# Patient Record
Sex: Male | Born: 1955
Health system: Southern US, Community
[De-identification: ages and names within clinical notes are randomized; demographics above are authoritative.]

## PROBLEM LIST (undated history)

## (undated) DIAGNOSIS — R51 Headache: Secondary | ICD-10-CM

## (undated) DIAGNOSIS — I5032 Chronic diastolic (congestive) heart failure: Secondary | ICD-10-CM

## (undated) DIAGNOSIS — Z9989 Dependence on other enabling machines and devices: Secondary | ICD-10-CM

## (undated) DIAGNOSIS — I441 Atrioventricular block, second degree: Secondary | ICD-10-CM

## (undated) DIAGNOSIS — I442 Atrioventricular block, complete: Secondary | ICD-10-CM

## (undated) DIAGNOSIS — G4733 Obstructive sleep apnea (adult) (pediatric): Secondary | ICD-10-CM

## (undated) DIAGNOSIS — R972 Elevated prostate specific antigen [PSA]: Secondary | ICD-10-CM

## (undated) DIAGNOSIS — N400 Enlarged prostate without lower urinary tract symptoms: Secondary | ICD-10-CM

## (undated) DIAGNOSIS — R600 Localized edema: Secondary | ICD-10-CM

## (undated) DIAGNOSIS — E1165 Type 2 diabetes mellitus with hyperglycemia: Secondary | ICD-10-CM

## (undated) DIAGNOSIS — I4510 Unspecified right bundle-branch block: Secondary | ICD-10-CM

## (undated) DIAGNOSIS — R519 Headache, unspecified: Secondary | ICD-10-CM

## (undated) DIAGNOSIS — J189 Pneumonia, unspecified organism: Secondary | ICD-10-CM

## (undated) DIAGNOSIS — Z950 Presence of cardiac pacemaker: Secondary | ICD-10-CM

## (undated) DIAGNOSIS — I10 Essential (primary) hypertension: Secondary | ICD-10-CM

## (undated) HISTORY — PX: PROSTATE BIOPSY: SHX241

## (undated) HISTORY — DX: Obstructive sleep apnea (adult) (pediatric): G47.33

## (undated) HISTORY — DX: Essential (primary) hypertension: I10

## (undated) HISTORY — PX: COLONOSCOPY: SHX174

## (undated) HISTORY — DX: Obstructive sleep apnea (adult) (pediatric): Z99.89

---

## 2003-09-14 ENCOUNTER — Ambulatory Visit (HOSPITAL_COMMUNITY): Admission: RE | Admit: 2003-09-14 | Discharge: 2003-09-14 | Payer: Self-pay | Admitting: Gastroenterology

## 2014-01-04 ENCOUNTER — Ambulatory Visit: Payer: Self-pay | Admitting: Cardiology

## 2014-12-13 ENCOUNTER — Encounter: Payer: Self-pay | Admitting: *Deleted

## 2016-12-06 DIAGNOSIS — I10 Essential (primary) hypertension: Secondary | ICD-10-CM | POA: Diagnosis not present

## 2016-12-06 DIAGNOSIS — G4733 Obstructive sleep apnea (adult) (pediatric): Secondary | ICD-10-CM | POA: Diagnosis not present

## 2016-12-06 DIAGNOSIS — Z0000 Encounter for general adult medical examination without abnormal findings: Secondary | ICD-10-CM | POA: Diagnosis not present

## 2016-12-11 ENCOUNTER — Encounter: Payer: Self-pay | Admitting: Cardiology

## 2016-12-11 DIAGNOSIS — R001 Bradycardia, unspecified: Secondary | ICD-10-CM | POA: Diagnosis not present

## 2016-12-11 DIAGNOSIS — I10 Essential (primary) hypertension: Secondary | ICD-10-CM | POA: Diagnosis not present

## 2016-12-11 DIAGNOSIS — I4510 Unspecified right bundle-branch block: Secondary | ICD-10-CM | POA: Diagnosis not present

## 2016-12-12 ENCOUNTER — Ambulatory Visit (INDEPENDENT_AMBULATORY_CARE_PROVIDER_SITE_OTHER): Payer: Commercial Managed Care - HMO | Admitting: Internal Medicine

## 2016-12-12 ENCOUNTER — Encounter: Payer: Self-pay | Admitting: Cardiology

## 2016-12-12 ENCOUNTER — Encounter: Payer: Self-pay | Admitting: Internal Medicine

## 2016-12-12 ENCOUNTER — Encounter (INDEPENDENT_AMBULATORY_CARE_PROVIDER_SITE_OTHER): Payer: Self-pay

## 2016-12-12 VITALS — BP 142/94 | HR 105 | Ht 75.0 in | Wt 331.8 lb

## 2016-12-12 DIAGNOSIS — I441 Atrioventricular block, second degree: Secondary | ICD-10-CM

## 2016-12-12 DIAGNOSIS — Z01812 Encounter for preprocedural laboratory examination: Secondary | ICD-10-CM | POA: Diagnosis not present

## 2016-12-12 DIAGNOSIS — I4430 Unspecified atrioventricular block: Secondary | ICD-10-CM | POA: Diagnosis not present

## 2016-12-12 DIAGNOSIS — I498 Other specified cardiac arrhythmias: Secondary | ICD-10-CM | POA: Diagnosis not present

## 2016-12-12 NOTE — Progress Notes (Signed)
      HPI [name] is referred by Dr. Wynonia Lawman for evaluation of symptomatic 2:1 AV block in the setting of exercise. He is a pleasant morbidly obese man with HTN, DM, obesity who has had problems with worsening sob. He has baseline RBBB but no syncope. He does have spells of weakness.  No Known Allergies   Current Outpatient Prescriptions  Medication Sig Dispense Refill  . amLODipine (NORVASC) 5 MG tablet Take 1 tablet by mouth daily.    Marland Kitchen losartan-hydrochlorothiazide (HYZAAR) 50-12.5 MG tablet Take 2 tablets by mouth daily.     No current facility-administered medications for this visit.      Past Medical History:  Diagnosis Date  . History of migraine headaches   . HTN (hypertension)   . OSA on CPAP     ROS:   All systems reviewed and negative except as noted in the HPI.   History reviewed. No pertinent surgical history.   Family History  Problem Relation Age of Onset  . Lung cancer Mother   . Arthritis Mother   . Colon cancer Father   . Lung cancer Father   . Healthy Brother     x3  . Healthy Sister      Social History   Social History  . Marital status: Divorced    Spouse name: N/A  . Number of children: 2  . Years of education: N/A   Occupational History  . Not on file.   Social History Main Topics  . Smoking status: Never Smoker  . Smokeless tobacco: Former Systems developer    Types: Snuff    Quit date: 06/11/2016  . Alcohol use 0.0 oz/week     Comment: occasionally   . Drug use: No  . Sexual activity: No   Other Topics Concern  . Not on file   Social History Narrative  . No narrative on file     BP (!) 142/94   Pulse (!) 105   Ht 6\' 3"  (1.905 m)   Wt (!) 331 lb 12.8 oz (150.5 kg)   SpO2 96%   BMI 41.47 kg/m   Physical Exam:  Stable appearing obese, middle age man, NAD HEENT: Unremarkable Neck:  6 cm JVD, no thyromegally Lymphatics:  No adenopathy Back:  No CVA tenderness Lungs:  Clear with no wheezes HEART:  Regular rate rhythm, no  murmurs, no rubs, no clicks Abd:  soft, obese, positive bowel sounds, no organomegally, no rebound, no guarding Ext:  2 plus pulses, no edema, no cyanosis, no clubbing Skin:  No rashes no nodules Neuro:  CN II through XII intact, motor grossly intact  EKG - reviewed. NSR with RBBB  Treadmill - NSR with RBBB then sinus tachycardia developing into 2:1 AV block  Assess/Plan: 1. Exercise induced 2:1 AV block - the patient has symptomatic advanced conduction system disease. I have recommended he undergo PPM insertion. The risks/benefits/goals/expectations of the procedure were reviewed and he wishes to proceed. I would anticipate placing a His bundle lead. 2. HTN heart disease - after his device is placed, I would anticipate he be started on AV nodal blocking agents. 3. Obesity - he will need to work on this. 4. Sleep apnea - records state he is on CPAP.  Weight loss is encouraged.

## 2016-12-12 NOTE — Consult Note (Signed)
Juan Hanson  Date of visit:  12/11/2016 DOB:  1956/07/12    Age:  61 yrs. Medical record number:  [id number]     Account number:  [id number] Primary Care Provider: Moreen Fowler, DAVID ____________________________ CURRENT DIAGNOSES  1.  Bradycardia, unspecified   2. Right Bundle Branch Block  3. Essential hypertension  4. Sleep apnea  5. Morbid (severe) obesity ____________________________ ALLERGIES  No Known Allergies ____________________________ MEDICATIONS  1. losartan 50 mg-hydrochlorothiazide 12.5 mg tablet, 1 p.o. daily  2. amlodipine 5 mg tablet, 1 p.o. daily ____________________________ HISTORY OF PRESENT ILLNESS This 61 year old male is seen for evaluation of bradycardia and an abnormal EKG. The patient has gained about 30 pounds of weight recently and has severe obesity. He works the night shift and does not get any exercise. He states that he will be retiring from work soon. He was recently seen for a physical and was noted to have bradycardia as well as a right bundle-branch block on his EKG. He was noted to be significantly hypertensive and was started on blood pressure medicine. He has been checking his blood pressure at home and still notes significant elevations of systolic and diastolic blood pressure. He denies PND, orthopnea or edema. He does not have any significant angina. He has significant sleep apnea and has been wearing CPAP for that. He is committed to losing weight at the present time. He has no prior history of an abnormal EKG. He does not have dizziness or syncope. ____________________________ PAST HISTORY  Past Medical Illnesses:  hypertension, sleep apnea, obesity;  Cardiovascular Illnesses:  no previous history of cardiac disease;  Infectious Diseases:  no previous history of significant infectious diseases;  Surgical Procedures:  no previous surgical procedures;  Trauma History:  no previous history of significant trauma;  NYHA Classification:  I;  Canadian Angina  Classification:  Class 0: Asymptomatic;  Cardiology Procedures-Invasive:  no previous interventional or invasive cardiology procedures;  Cardiology Procedures-Noninvasive:  no previous non-invasive cardiovascular testing;  Peripheral Vascular Procedures:  no previous invasive peripheral vascular procedures.;  LVEF not documented,   ____________________________ CARDIO-PULMONARY TEST DATES EKG Date:  12/11/2016;   ____________________________ FAMILY HISTORY Brother -- Brother alive and well Brother -- Brother alive and well Brother -- Brother alive and well Father -- Father dead, Cancer Mother -- Mother dead, Arthritis, Malignant neoplasm of lung Sister -- Sister alive and well ____________________________ SOCIAL HISTORY Alcohol Use:  socially;  Smoking:  never smoked, uses smokeless tobacco (snuff) occasionally;  Diet:  regular diet;  Lifestyle:  divorced, 1 son and 1 daughter;  Exercise:  no regular exercise;  Occupation:  [employer];  Residence:  lives alone;   ____________________________ REVIEW OF SYSTEMS General:  obesity, weight gain of approximately 30 lbs  Integumentary:no rashes or new skin lesions. Eyes: denies diplopia, history of glaucoma or visual problems. Ears, Nose, Throat, Mouth:  denies any hearing loss, epistaxis, hoarseness or difficulty speaking. Respiratory: denies dyspnea, cough, wheezing or hemoptysis. Cardiovascular:  please review HPI Abdominal: denies dyspepsia, GI bleeding, constipation, or diarrhea Genitourinary-Male: no dysuria, urgency, frequency, or nocturia  Musculoskeletal:  denies arthritis, venous insufficiency, or muscle weakness Neurological:  denies headaches, stroke, or TIA Psychiatric:  denies depression or anxiety Hematological/Immunologic:  denies any food allergies, bleeding disorders. ____________________________ PHYSICAL EXAMINATION VITAL SIGNS  Blood Pressure:  166/104 Sitting, Left arm, large cuff  , 170/100 Standing, Left arm and  large cuff   Pulse:  84/min. Weight:  331.00 lbs. Height:  75"BMI: 41  Constitutional:  pleasant white male in no acute distress, severely obese Skin:  warm and dry to touch, no apparent skin lesions, or masses noted. Head:  normocephalic, normal hair pattern, no masses or tenderness Eyes:  EOMS Intact, PERRLA, C and S clear, Funduscopic exam not done. ENT:  ears, nose and throat reveal no gross abnormalities.  Dentition good. Neck:  supple, without massess. No JVD, thyromegaly or carotid bruits. Carotid upstroke normal. Chest:  normal symmetry, clear to auscultation. Cardiac:  regular rhythm, normal S1 and S2, No S3 or S4, no murmurs, gallops or rubs detected. Abdomen:  abdomen soft,non-tender, no masses, no hepatospenomegaly, or aneurysm noted Peripheral Pulses:  the femoral,dorsalis pedis, and posterior tibial pulses are full and equal bilaterally with no bruits auscultated. Extremities & Back:  no deformities, clubbing, cyanosis, erythema or edema observed. Normal muscle strength and tone. Neurological:  no gross motor or sensory deficits noted, affect appropriate, oriented x3. ____________________________ IMPRESSIONS/PLAN  1. Previous sinus bradycardia but normal rhythm on EKG today 2. Right bundle-branch block 3. Hypertension currently not well controlled 4. Severe morbid obesity 5. Sleep apnea under treatment  Recommendations:  We discussed the right bundle-branch block as well as sinus bradycardia. He is really not symptomatic from either at this time. He does have blood pressure that is currently not well controlled. I recommended that he add amlodipine 5 mg to his regimen and that he double his losartan 50/HCTZ 12.5 mg tablet. He should continue to monitor his blood pressure and pulse at home. I recommended an exercise treadmill test to evaluate his chronotropic response to exercise and have also recommended that he have an echocardiogram to assess left ventricular wall thickness  in light of his hypertension. He is committed to following a carbohydrate restricted diet and losing weight. We will know about the safety of exercise after his exercise treadmill. EKG personally reviewed by me shows RBBB and nonspcific ST changes.  Thank you for asking me to see him with you. Have requested a copy of the EKG done at Dr. Laqueta Linden office.  ____________________________ TODAYS ORDERS  1. 12 Lead EKG: Today  2. 2D, color flow, doppler: First Available  3. treadmill:  Regular TM First Available                       ____________________________ Cardiology Physician:  Kerry Hough MD [facility]

## 2016-12-12 NOTE — Patient Instructions (Addendum)
Medication Instructions:  Your physician recommends that you continue on your current medications as directed. Please refer to the Current Medication list given to you today.   Labwork: TODAY - BMET / CBC w/ diff  Testing/Procedures: Dual Chamber His Bundle Pacemaker  Follow-Up: Follow-up for a wound check in the Device Clinic 10-14 days from 12/18/16 and with Dr. Lovena Le 91 days after 12/18/16.    Any Other Special Instructions Will Be Listed Below ---  Please wash with the CHG Soap the night before and morning of procedure (follow instruction page "Preparing For Surgery").   Report to the  [facility] on 12/18/16 at 9:30 AM  Nothing to eat or drink after midnight the night before procedure  Do not take any medications the morning of procedure  Plan 1 night stay    If you need a refill on your cardiac medications before your next appointment, please call your pharmacy.

## 2016-12-12 NOTE — Procedures (Signed)
  Juan Hanson  Date of visit:  12/12/2016 DOB:  1956-08-16    Age:  61 yrs. Medical record number:  [id number]     Account number:  [id number] Primary Care Provider: Moreen Fowler, DAVID  CURRENT DIAGNOSES  1. Right Bundle Branch Block  2. Bradycardia, unspecified  3. Essential hypertension  4. Sleep apnea  5. Morbid (severe) obesity  TREADMILL  The patient exercised on the standard Bruce protocol a total of 6:09 minutes into Bruce stage 2 achieving a workload of 7 METS. The test was stopped due to dyspnea and fatigue. The patient had no chest pain suggestive of angina. His heart rate initially rose to 130 and he then began to have some intermittent dropped beats at 3:48 minutes into exercise and then went into 2 to one heart block at a rate of 70. He was able to continue exercise however and at 4:44 minutes into exercise lost his right bundle branch block and appeared to have normal conduction with occasional conducted beats. He remained in 21 heart block and had a normal blood pressure response to exercise. At 1:49 and exercise he then went back into 1:1 conduction.  12-lead EKG shows a right bundle branch block at rest. With exercise the patient developed a 2-1 heart block and also normalized conduction. When he was in normal conduction there was no significant ST depression. In recovery he develops one-to-one conduction and developed right bundle branch block again.  IMPRESSIONS:  1. Clinically negative for ischemia 2. EKG remarkable for the development of 2-1 heart block and more advanced conduction system disease with exercise that persists into recovery and resolves. 3. Reduced exercise capacity for age  Recommendations:  Referral to electrophysiology for further evaluation. Await results of echocardiogram.   Cardiology Physician:  Kerry Hough MD [facility]

## 2016-12-13 ENCOUNTER — Encounter: Payer: Self-pay | Admitting: Cardiology

## 2016-12-13 DIAGNOSIS — I119 Hypertensive heart disease without heart failure: Secondary | ICD-10-CM | POA: Insufficient documentation

## 2016-12-13 DIAGNOSIS — G4730 Sleep apnea, unspecified: Secondary | ICD-10-CM | POA: Insufficient documentation

## 2016-12-13 LAB — CBC WITH DIFFERENTIAL/PLATELET
BASOS ABS: 0 10*3/uL (ref 0.0–0.2)
BASOS: 0 %
EOS (ABSOLUTE): 0.1 10*3/uL (ref 0.0–0.4)
EOS: 1 %
HEMATOCRIT: 45 % (ref 37.5–51.0)
HEMOGLOBIN: 15.9 g/dL (ref 13.0–17.7)
IMMATURE GRANS (ABS): 0 10*3/uL (ref 0.0–0.1)
Immature Granulocytes: 0 %
Lymphocytes Absolute: 2.7 10*3/uL (ref 0.7–3.1)
Lymphs: 27 %
MCH: 28.8 pg (ref 26.6–33.0)
MCHC: 35.3 g/dL (ref 31.5–35.7)
MCV: 81 fL (ref 79–97)
MONOCYTES: 9 %
Monocytes Absolute: 0.9 10*3/uL (ref 0.1–0.9)
NEUTROS ABS: 6.1 10*3/uL (ref 1.4–7.0)
Neutrophils: 63 %
Platelets: 224 10*3/uL (ref 150–379)
RBC: 5.53 x10E6/uL (ref 4.14–5.80)
RDW: 14.1 % (ref 12.3–15.4)
WBC: 9.8 10*3/uL (ref 3.4–10.8)

## 2016-12-13 LAB — BASIC METABOLIC PANEL
BUN/Creatinine Ratio: 19 (ref 9–20)
BUN: 18 mg/dL (ref 6–24)
CALCIUM: 9.5 mg/dL (ref 8.7–10.2)
CHLORIDE: 97 mmol/L (ref 96–106)
CO2: 27 mmol/L (ref 18–29)
CREATININE: 0.94 mg/dL (ref 0.76–1.27)
GFR calc Af Amer: 102 mL/min/{1.73_m2} (ref >59)
GFR, EST NON AFRICAN AMERICAN: 88 mL/min/{1.73_m2} (ref >59)
Glucose: 91 mg/dL (ref 65–99)
Potassium: 4.4 mmol/L (ref 3.5–5.2)
Sodium: 142 mmol/L (ref 134–144)

## 2016-12-14 ENCOUNTER — Telehealth: Payer: Self-pay | Admitting: Internal Medicine

## 2016-12-14 DIAGNOSIS — I119 Hypertensive heart disease without heart failure: Secondary | ICD-10-CM | POA: Diagnosis not present

## 2016-12-14 NOTE — Telephone Encounter (Signed)
°  New Prob   Pt has some questions regarding his upcoming cath procedure. Please call.

## 2016-12-14 NOTE — Telephone Encounter (Signed)
Called, spoke with pt. Reviewed pt instructions r/t procedure on 12/18/16. Pt verbalized understanding.

## 2016-12-18 ENCOUNTER — Encounter (HOSPITAL_COMMUNITY): Payer: Self-pay | Admitting: General Practice

## 2016-12-18 ENCOUNTER — Encounter (HOSPITAL_COMMUNITY)
Admission: RE | Disposition: A | Discharge status: Home / Self Care | Payer: Self-pay | Source: Ambulatory Visit | Attending: Internal Medicine

## 2016-12-18 ENCOUNTER — Ambulatory Visit (HOSPITAL_COMMUNITY)
Admission: RE | Admit: 2016-12-18 | Discharge: 2016-12-19 | Disposition: A | Discharge status: Home / Self Care | Payer: Commercial Managed Care - HMO | Source: Ambulatory Visit | Attending: Internal Medicine | Admitting: Internal Medicine

## 2016-12-18 DIAGNOSIS — I441 Atrioventricular block, second degree: Secondary | ICD-10-CM | POA: Diagnosis present

## 2016-12-18 DIAGNOSIS — Z6841 Body Mass Index (BMI) 40.0 and over, adult: Secondary | ICD-10-CM | POA: Insufficient documentation

## 2016-12-18 DIAGNOSIS — I10 Essential (primary) hypertension: Secondary | ICD-10-CM | POA: Diagnosis not present

## 2016-12-18 DIAGNOSIS — Z87891 Personal history of nicotine dependence: Secondary | ICD-10-CM | POA: Insufficient documentation

## 2016-12-18 DIAGNOSIS — Z950 Presence of cardiac pacemaker: Secondary | ICD-10-CM

## 2016-12-18 DIAGNOSIS — G43909 Migraine, unspecified, not intractable, without status migrainosus: Secondary | ICD-10-CM | POA: Diagnosis not present

## 2016-12-18 DIAGNOSIS — I4510 Unspecified right bundle-branch block: Secondary | ICD-10-CM | POA: Insufficient documentation

## 2016-12-18 DIAGNOSIS — G4733 Obstructive sleep apnea (adult) (pediatric): Secondary | ICD-10-CM | POA: Insufficient documentation

## 2016-12-18 DIAGNOSIS — R001 Bradycardia, unspecified: Secondary | ICD-10-CM | POA: Insufficient documentation

## 2016-12-18 DIAGNOSIS — E119 Type 2 diabetes mellitus without complications: Secondary | ICD-10-CM | POA: Insufficient documentation

## 2016-12-18 HISTORY — PX: INSERT / REPLACE / REMOVE PACEMAKER: SUR710

## 2016-12-18 HISTORY — DX: Presence of cardiac pacemaker: Z95.0

## 2016-12-18 HISTORY — DX: Unspecified right bundle-branch block: I45.10

## 2016-12-18 HISTORY — DX: Headache: R51

## 2016-12-18 HISTORY — DX: Atrioventricular block, second degree: I44.1

## 2016-12-18 HISTORY — DX: Pneumonia, unspecified organism: J18.9

## 2016-12-18 HISTORY — PX: PACEMAKER IMPLANT: EP1218

## 2016-12-18 HISTORY — DX: Headache, unspecified: R51.9

## 2016-12-18 LAB — SURGICAL PCR SCREEN
MRSA, PCR: NEGATIVE
STAPHYLOCOCCUS AUREUS: POSITIVE — AB

## 2016-12-18 SURGERY — PACEMAKER IMPLANT
Anesthesia: LOCAL

## 2016-12-18 MED ORDER — MUPIROCIN 2 % EX OINT
TOPICAL_OINTMENT | CUTANEOUS | Status: AC
  Filled 2016-12-18: qty 22

## 2016-12-18 MED ORDER — LOSARTAN POTASSIUM-HCTZ 50-12.5 MG PO TABS: 2.0000 | ORAL_TABLET | Freq: Every day | ORAL | Status: DC

## 2016-12-18 MED ORDER — MIDAZOLAM HCL 5 MG/5ML IJ SOLN
INTRAMUSCULAR | Status: DC | PRN
  Administered 2016-12-18 (×2): 1 mg via INTRAVENOUS
  Administered 2016-12-18: 2 mg via INTRAVENOUS

## 2016-12-18 MED ORDER — LOSARTAN POTASSIUM 50 MG PO TABS
50.0000 mg | ORAL_TABLET | Freq: Every day | ORAL | Status: DC
  Administered 2016-12-18 – 2016-12-19 (×2): 50 mg via ORAL
  Filled 2016-12-18 (×2): qty 1

## 2016-12-18 MED ORDER — CEFAZOLIN SODIUM-DEXTROSE 2-4 GM/100ML-% IV SOLN
2.0000 g | INTRAVENOUS | Status: AC
  Administered 2016-12-18: 2 g via INTRAVENOUS

## 2016-12-18 MED ORDER — CHLORHEXIDINE GLUCONATE 4 % EX LIQD: 60.0000 mL | Freq: Once | CUTANEOUS | Status: DC

## 2016-12-18 MED ORDER — AMLODIPINE BESYLATE 5 MG PO TABS
5.0000 mg | ORAL_TABLET | Freq: Every day | ORAL | Status: DC
Start: 2016-12-18 — End: 2016-12-19
  Administered 2016-12-18 – 2016-12-19 (×2): 5 mg via ORAL
  Filled 2016-12-18 (×2): qty 1

## 2016-12-18 MED ORDER — MUPIROCIN 2 % EX OINT
TOPICAL_OINTMENT | Freq: Once | CUTANEOUS | Status: AC
  Administered 2016-12-18: 1 via NASAL

## 2016-12-18 MED ORDER — SODIUM CHLORIDE 0.9 % IV SOLN
INTRAVENOUS | Status: DC
  Administered 2016-12-18: 10:00:00 via INTRAVENOUS

## 2016-12-18 MED ORDER — HYDROCHLOROTHIAZIDE 12.5 MG PO CAPS
12.5000 mg | ORAL_CAPSULE | Freq: Every day | ORAL | Status: DC
  Administered 2016-12-18 – 2016-12-19 (×2): 12.5 mg via ORAL
  Filled 2016-12-18 (×2): qty 1

## 2016-12-18 MED ORDER — CEFAZOLIN SODIUM-DEXTROSE 2-4 GM/100ML-% IV SOLN
INTRAVENOUS | Status: AC
  Filled 2016-12-18: qty 100

## 2016-12-18 MED ORDER — CEFAZOLIN SODIUM-DEXTROSE 2-4 GM/100ML-% IV SOLN
2.0000 g | Freq: Four times a day (QID) | INTRAVENOUS | Status: AC
  Administered 2016-12-18 – 2016-12-19 (×3): 2 g via INTRAVENOUS
  Filled 2016-12-18 (×3): qty 100

## 2016-12-18 MED ORDER — HEPARIN (PORCINE) IN NACL 2-0.9 UNIT/ML-% IJ SOLN
INTRAMUSCULAR | Status: AC
  Filled 2016-12-18: qty 500

## 2016-12-18 MED ORDER — ACETAMINOPHEN 325 MG PO TABS: 325.0000 mg | ORAL_TABLET | ORAL | Status: DC | PRN

## 2016-12-18 MED ORDER — LIDOCAINE HCL (PF) 1 % IJ SOLN
INTRAMUSCULAR | Status: AC
  Filled 2016-12-18: qty 60

## 2016-12-18 MED ORDER — YOU HAVE A PACEMAKER BOOK
Freq: Once | Status: AC
  Administered 2016-12-18: 21:00:00
  Filled 2016-12-18: qty 1

## 2016-12-18 MED ORDER — ONDANSETRON HCL 4 MG/2ML IJ SOLN: 4.0000 mg | Freq: Four times a day (QID) | INTRAMUSCULAR | Status: DC | PRN

## 2016-12-18 MED ORDER — LIDOCAINE HCL (PF) 1 % IJ SOLN
INTRAMUSCULAR | Status: DC | PRN
  Administered 2016-12-18: 40 mL

## 2016-12-18 MED ORDER — FENTANYL CITRATE (PF) 100 MCG/2ML IJ SOLN
INTRAMUSCULAR | Status: AC
  Filled 2016-12-18: qty 2

## 2016-12-18 MED ORDER — MIDAZOLAM HCL 5 MG/5ML IJ SOLN
INTRAMUSCULAR | Status: AC
  Filled 2016-12-18: qty 5

## 2016-12-18 MED ORDER — HEPARIN (PORCINE) IN NACL 2-0.9 UNIT/ML-% IJ SOLN
INTRAMUSCULAR | Status: DC | PRN
  Administered 2016-12-18: 500 mL

## 2016-12-18 MED ORDER — SODIUM CHLORIDE 0.9 % IR SOLN
80.0000 mg | Status: AC
  Administered 2016-12-18: 80 mg

## 2016-12-18 MED ORDER — OFF THE BEAT BOOK
Freq: Once | Status: AC
  Administered 2016-12-18: 21:00:00
  Filled 2016-12-18: qty 1

## 2016-12-18 MED ORDER — SODIUM CHLORIDE 0.9 % IR SOLN
Status: AC
  Filled 2016-12-18: qty 2

## 2016-12-18 MED ORDER — FENTANYL CITRATE (PF) 100 MCG/2ML IJ SOLN
INTRAMUSCULAR | Status: DC | PRN
  Administered 2016-12-18: 12.5 ug via INTRAVENOUS
  Administered 2016-12-18: 25 ug via INTRAVENOUS
  Administered 2016-12-18: 12.5 ug via INTRAVENOUS

## 2016-12-18 SURGICAL SUPPLY — 13 items
CABLE SURGICAL S-101-97-12 (CABLE) ×4 IMPLANT
CATH RIGHTSITE C315HIS02 (CATHETERS) ×2 IMPLANT
HOVERMATT SINGLE USE (MISCELLANEOUS) ×2 IMPLANT
IPG PACE AZUR XT DR MRI W1DR01 (Pacemaker) ×1 IMPLANT
LEAD CAPSURE NOVUS 5076-52CM (Lead) ×2 IMPLANT
LEAD SELECT SECURE 3830 383069 (Lead) ×1 IMPLANT
PACE AZURE XT DR MRI W1DR01 (Pacemaker) ×2 IMPLANT
PAD DEFIB LIFELINK (PAD) ×2 IMPLANT
SELECT SECURE 3830 383069 (Lead) ×2 IMPLANT
SHEATH CLASSIC 7F (SHEATH) ×4 IMPLANT
SLITTER 6232ADJ (MISCELLANEOUS) ×2 IMPLANT
TRAY PACEMAKER INSERTION (PACKS) ×2 IMPLANT
WIRE HI TORQ VERSACORE-J 145CM (WIRE) ×2 IMPLANT

## 2016-12-18 NOTE — H&P (View-Only) (Signed)
      HPI [name] is referred by Dr. Wynonia Lawman for evaluation of symptomatic 2:1 AV block in the setting of exercise. He is a pleasant morbidly obese man with HTN, DM, obesity who has had problems with worsening sob. He has baseline RBBB but no syncope. He does have spells of weakness.  No Known Allergies   Current Outpatient Prescriptions  Medication Sig Dispense Refill  . amLODipine (NORVASC) 5 MG tablet Take 1 tablet by mouth daily.    Marland Kitchen losartan-hydrochlorothiazide (HYZAAR) 50-12.5 MG tablet Take 2 tablets by mouth daily.     No current facility-administered medications for this visit.      Past Medical History:  Diagnosis Date  . History of migraine headaches   . HTN (hypertension)   . OSA on CPAP     ROS:   All systems reviewed and negative except as noted in the HPI.   History reviewed. No pertinent surgical history.   Family History  Problem Relation Age of Onset  . Lung cancer Mother   . Arthritis Mother   . Colon cancer Father   . Lung cancer Father   . Healthy Brother     x3  . Healthy Sister      Social History   Social History  . Marital status: Divorced    Spouse name: N/A  . Number of children: 2  . Years of education: N/A   Occupational History  . Not on file.   Social History Main Topics  . Smoking status: Never Smoker  . Smokeless tobacco: Former Systems developer    Types: Snuff    Quit date: 06/11/2016  . Alcohol use 0.0 oz/week     Comment: occasionally   . Drug use: No  . Sexual activity: No   Other Topics Concern  . Not on file   Social History Narrative  . No narrative on file     BP (!) 142/94   Pulse (!) 105   Ht 6\' 3"  (1.905 m)   Wt (!) 331 lb 12.8 oz (150.5 kg)   SpO2 96%   BMI 41.47 kg/m   Physical Exam:  Stable appearing obese, middle age man, NAD HEENT: Unremarkable Neck:  6 cm JVD, no thyromegally Lymphatics:  No adenopathy Back:  No CVA tenderness Lungs:  Clear with no wheezes HEART:  Regular rate rhythm, no  murmurs, no rubs, no clicks Abd:  soft, obese, positive bowel sounds, no organomegally, no rebound, no guarding Ext:  2 plus pulses, no edema, no cyanosis, no clubbing Skin:  No rashes no nodules Neuro:  CN II through XII intact, motor grossly intact  EKG - reviewed. NSR with RBBB  Treadmill - NSR with RBBB then sinus tachycardia developing into 2:1 AV block  Assess/Plan: 1. Exercise induced 2:1 AV block - the patient has symptomatic advanced conduction system disease. I have recommended he undergo PPM insertion. The risks/benefits/goals/expectations of the procedure were reviewed and he wishes to proceed. I would anticipate placing a His bundle lead. 2. HTN heart disease - after his device is placed, I would anticipate he be started on AV nodal blocking agents. 3. Obesity - he will need to work on this. 4. Sleep apnea - records state he is on CPAP.  Weight loss is encouraged.

## 2016-12-18 NOTE — Progress Notes (Signed)
Orthopedic Tech Progress Note Patient Details:  Juan Hanson [birth year] [id number]  Ortho Devices Type of Ortho Device: Arm sling   [name] 12/18/2016, 1:13 PM

## 2016-12-18 NOTE — Interval H&P Note (Signed)
History and Physical Interval Note:  12/18/2016 10:19 AM  Juan Hanson  has presented today for surgery, with the diagnosis of av block  The various methods of treatment have been discussed with the patient and family. After consideration of risks, benefits and other options for treatment, the patient has consented to  Procedure(s): Pacemaker Implant (N/A) as a surgical intervention .  The patient's history has been reviewed, patient examined, no change in status, stable for surgery.  I have reviewed the patient's chart and labs.  Questions were answered to the patient's satisfaction.     Juan Hanson

## 2016-12-18 NOTE — Interval H&P Note (Signed)
History and Physical Interval Note:  12/18/2016 10:37 AM  Juan Hanson  has presented today for surgery, with the diagnosis of av block  The various methods of treatment have been discussed with the patient and family. After consideration of risks, benefits and other options for treatment, the patient has consented to  Procedure(s): Pacemaker Implant (N/A) as a surgical intervention .  The patient's history has been reviewed, patient examined, no change in status, stable for surgery.  I have reviewed the patient's chart and labs.  Questions were answered to the patient's satisfaction.     [name]

## 2016-12-18 NOTE — Discharge Summary (Signed)
ELECTROPHYSIOLOGY PROCEDURE DISCHARGE SUMMARY    Patient ID: Juan Hanson,  MRN: [id number], DOB/AGE: [birth year] 61 y.o.  Admit date: 12/18/2016 Discharge date: 12/19/16  Primary Care Physician: Gara Kroner, MD  Primary Cardiologist: Dr. Wynonia Lawman Electrophysiologist: Dr. Lovena Le  Primary Discharge Diagnosis:  1. Symptomatic 2:1 AVblock     S/p PPM this admission  Secondary Discharge Diagnosis:  1. HTN 2. DM 3. RBBB  No Known Allergies   Procedures This Admission:  1.  Implantation of a MDT dual chamber PPM on 12/18/16 by Dr Lovena Le.  The patient received a medtronic (serial number [id number]) pacemaker, Medtronic [id number] (serial number [id number]) right atrial lead and a [id number] His bundle (serial number [id number]) right ventricular lead  There were no immediate post procedure complications. 2.  CXR on 12/19/16 demonstrated no pneumothorax status post device implantation.   Brief HPI: Juan Hanson is a 61 y.o. male was referred to electrophysiology in the outpatient setting for consideration of PPM implantation.  Past medical history includes HTN, DM, RBBB, 2:1 AV block.  The patient has had symptomatic bradycardia without reversible causes identified.  Risks, benefits, and alternatives to PPM implantation were reviewed with the patient who wished to proceed.   Hospital Course:  The patient was admitted and underwent implantation of a PPM with details as outlined above.  He  was monitored on telemetry overnight which demonstrated SR, occasional pacing.  Left chest was without hematoma or ecchymosis.  The device was interrogated and found to be functioning normally.  CXR was obtained and demonstrated no pneumothorax status post device implantation.  Wound care, arm mobility, and restrictions were reviewed with the patient.  The patient was examined by Dr. Lovena Le and considered stable for discharge to home.    Physical Exam: Vitals:   12/18/16 2030 12/18/16 2100  12/19/16 0530 12/19/16 0730  BP: (!) 145/78 135/79 125/70 138/76  Pulse: 79 73 60 61  Resp: 15 (!) 22 13 15   Temp: 98.1 F (36.7 C)  97.4 F (36.3 C) 98 F (36.7 C)  TempSrc: Oral  Oral Oral  SpO2: 97% 94% 98% 95%  Weight:   (!) 328 lb 7.8 oz (149 kg)   Height:        GEN- The patient is well appearing, alert and oriented x 3 today.   HEENT: normocephalic, atraumatic; sclera clear, conjunctiva pink; hearing intact; oropharynx clear; neck supple, no JVP Lungs- CTA b/l, normal work of breathing.  No wheezes, rales, rhonchi Heart- RRR, no murmurs, rubs or gallops, PMI not laterally displaced GI- soft, non-tender, non-distended Extremities- no clubbing, cyanosis, or edema MS- no significant deformity or atrophy Skin- warm and dry, no rash or lesion, left chest without hematoma/ecchymosis Psych- euthymic mood, full affect Neuro- no gross deficits   Labs:   Lab Results  Component Value Date   WBC 9.8 12/12/2016   HCT 45.0 12/12/2016   MCV 81 12/12/2016   PLT 224 12/12/2016     Recent Labs Lab 12/12/16 1543  NA 142  K 4.4  CL 97  CO2 27  BUN 18  CREATININE 0.94  CALCIUM 9.5  GLUCOSE 91    Discharge Medications:  Allergies as of 12/19/2016   No Known Allergies     Medication List    TAKE these medications   amLODipine 5 MG tablet Commonly known as:  NORVASC Take 1 tablet by mouth daily.   losartan-hydrochlorothiazide 50-12.5 MG tablet Commonly known as:  HYZAAR Take 2 tablets by mouth daily.  Disposition:  Home   Discharge Instructions    Diet - low sodium heart healthy    Complete by:  As directed    Increase activity slowly    Complete by:  As directed      Follow-up Information    [place] Office Follow up on 12/31/2016.   Specialty:  Cardiology Why:  2:00PM, wound check visit Contact information: [address], [place]       [name], MD Follow up on  03/19/2017.   Specialty:  Cardiology Why:  10:45AM Contact information: [address] [address] [phone]           Duration of Discharge Encounter: Greater than 30 minutes including physician time.  Venetia Night, PA-C 12/19/2016 9:36 AM  EP Attending  Patient seen and examined. Agree with above. His device is working normally. His His bundle threshold is up a bit. His QRS is good. R waves are down a bit, though better unipolar. His CXR has been reviewed and looks good. Will plan to dc home with usual followup.   Mikle Bosworth.D.

## 2016-12-18 NOTE — Discharge Instructions (Signed)
° ° °  Supplemental Discharge Instructions for  Pacemaker/Defibrillator Patients  Activity No heavy lifting or vigorous activity with your left/right arm for 6 to 8 weeks.  Do not raise your left/right arm above your head for one week.  Gradually raise your affected arm as drawn below.             12/22/16                      12/23/16                   12/24/16                   12/25/16 __  NO DRIVING for  1 week   ; you may begin driving on  [id number]   .  WOUND CARE - Keep the wound area clean and dry.  Do not get this area wet for one week. No showers for one week; you may shower on 12/25/16    . - The tape/steri-strips on your wound will fall off; do not pull them off.  No bandage is needed on the site.  DO  NOT apply any creams, oils, or ointments to the wound area. - If you notice any drainage or discharge from the wound, any swelling or bruising at the site, or you develop a fever > 101? F after you are discharged home, call the office at once.  Special Instructions - You are still able to use cellular telephones; use the ear opposite the side where you have your pacemaker/defibrillator.  Avoid carrying your cellular phone near your device. - When traveling through airports, show security personnel your identification card to avoid being screened in the metal detectors.  Ask the security personnel to use the hand wand. - Avoid arc welding equipment, MRI testing (magnetic resonance imaging), TENS units (transcutaneous nerve stimulators).  Call the office for questions about other devices. - Avoid electrical appliances that are in poor condition or are not properly grounded. - Microwave ovens are safe to be near or to operate.  Additional information for defibrillator patients should your device go off: - If your device goes off ONCE and you feel fine afterward, notify the device clinic nurses. - If your device goes off ONCE and you do not feel well afterward, call 911. - If your device  goes off TWICE, call 911. - If your device goes off THREE times in one day, call 911.  DO NOT DRIVE YOURSELF OR A FAMILY MEMBER WITH A DEFIBRILLATOR TO THE HOSPITAL--CALL 911.

## 2016-12-19 ENCOUNTER — Ambulatory Visit (HOSPITAL_COMMUNITY): Payer: Commercial Managed Care - HMO

## 2016-12-19 ENCOUNTER — Other Ambulatory Visit: Payer: Self-pay

## 2016-12-19 DIAGNOSIS — E119 Type 2 diabetes mellitus without complications: Secondary | ICD-10-CM | POA: Diagnosis not present

## 2016-12-19 DIAGNOSIS — I10 Essential (primary) hypertension: Secondary | ICD-10-CM | POA: Diagnosis not present

## 2016-12-19 DIAGNOSIS — R001 Bradycardia, unspecified: Secondary | ICD-10-CM | POA: Diagnosis not present

## 2016-12-19 DIAGNOSIS — Z950 Presence of cardiac pacemaker: Secondary | ICD-10-CM | POA: Diagnosis not present

## 2016-12-19 DIAGNOSIS — I441 Atrioventricular block, second degree: Secondary | ICD-10-CM | POA: Diagnosis not present

## 2016-12-19 MED FILL — Gentamicin Sulfate Inj 40 MG/ML: INTRAMUSCULAR | Qty: 2 | Status: AC

## 2016-12-19 MED FILL — Sodium Chloride Irrigation Soln 0.9%: Qty: 500 | Status: AC

## 2016-12-19 NOTE — Care Management Note (Signed)
Case Management Note  Patient Details  Name: Juan Hanson MRN: [id number] Date of Birth: 02/02/1956  Subjective/Objective:   S/p pacemaker implantation, NCM will cont to follow for  Dc needs.                 Action/Plan:   Expected Discharge Date:                  Expected Discharge Plan:  Home/Self Care  In-House Referral:     Discharge planning Services  CM Consult  Post Acute Care Choice:    Choice offered to:     DME Arranged:    DME Agency:     HH Arranged:    HH Agency:     Status of Service:  In process, will continue to follow  If discussed at Long Length of Stay Meetings, dates discussed:    Additional Comments:  Zenon Mayo, RN 12/19/2016, 9:23 AM

## 2016-12-31 ENCOUNTER — Ambulatory Visit (INDEPENDENT_AMBULATORY_CARE_PROVIDER_SITE_OTHER): Payer: Commercial Managed Care - HMO | Admitting: *Deleted

## 2016-12-31 DIAGNOSIS — I441 Atrioventricular block, second degree: Secondary | ICD-10-CM

## 2016-12-31 NOTE — Progress Notes (Signed)
Wound check appointment. Steri-strips removed. Wound without redness or edema. Incision edges approximated, wound well healed. Normal device function. Thresholds, sensing, and impedances consistent with implant measurements. HIS bundle capture noted at 1.25V@ 1.67ms. Device programmed at 3.5V on for extra safety margin until 3 month visit. Histogram distribution appropriate for patient and level of activity. No mode switches or high ventricular rates noted. Patient educated about wound care, arm mobility, lifting restrictions. ROV 03/19/2017 with GT.

## 2017-01-01 ENCOUNTER — Encounter: Payer: Self-pay | Admitting: Cardiology

## 2017-01-01 ENCOUNTER — Telehealth: Payer: Self-pay

## 2017-01-01 DIAGNOSIS — Z950 Presence of cardiac pacemaker: Secondary | ICD-10-CM | POA: Insufficient documentation

## 2017-01-01 LAB — CUP PACEART INCLINIC DEVICE CHECK
Brady Statistic AP VP Percent: 11.88 %
Brady Statistic AP VS Percent: 1.72 %
Brady Statistic AS VP Percent: 17.28 %
Brady Statistic AS VS Percent: 69.12 %
Brady Statistic RV Percent Paced: 29.17 %
Date Time Interrogation Session: 20180305190810
Implantable Lead Location: 753860
Implantable Lead Model: 3830
Lead Channel Impedance Value: 323 Ohm
Lead Channel Impedance Value: 456 Ohm
Lead Channel Impedance Value: 494 Ohm
Lead Channel Pacing Threshold Amplitude: 0.5 V
Lead Channel Sensing Intrinsic Amplitude: 1.875 mV
Lead Channel Sensing Intrinsic Amplitude: 2 mV
Lead Channel Sensing Intrinsic Amplitude: 6.5 mV
Lead Channel Setting Pacing Amplitude: 3.5 V
Lead Channel Setting Pacing Pulse Width: 1 ms
Lead Channel Setting Sensing Sensitivity: 0.9 mV
MDC IDC LEAD IMPLANT DT: 20180220
MDC IDC LEAD IMPLANT DT: 20180220
MDC IDC LEAD LOCATION: 753859
MDC IDC MSMT BATTERY REMAINING LONGEVITY: 125 mo
MDC IDC MSMT BATTERY VOLTAGE: 3.22 V
MDC IDC MSMT LEADCHNL RA IMPEDANCE VALUE: 342 Ohm
MDC IDC MSMT LEADCHNL RA PACING THRESHOLD AMPLITUDE: 0.75 V
MDC IDC MSMT LEADCHNL RA PACING THRESHOLD PULSEWIDTH: 0.4 ms
MDC IDC MSMT LEADCHNL RA SENSING INTR AMPL: 4.625 mV
MDC IDC MSMT LEADCHNL RV PACING THRESHOLD PULSEWIDTH: 0.4 ms
MDC IDC PG IMPLANT DT: 20180220
MDC IDC SET LEADCHNL RA PACING AMPLITUDE: 3.5 V
MDC IDC STAT BRADY RA PERCENT PACED: 13.42 %

## 2017-01-01 NOTE — Telephone Encounter (Signed)
LVM for pt to call back with serial number from home monitor.

## 2017-02-21 ENCOUNTER — Encounter: Payer: Self-pay | Admitting: Family Medicine

## 2017-02-21 ENCOUNTER — Ambulatory Visit (INDEPENDENT_AMBULATORY_CARE_PROVIDER_SITE_OTHER): Payer: Commercial Managed Care - HMO | Admitting: Family Medicine

## 2017-02-21 DIAGNOSIS — G4733 Obstructive sleep apnea (adult) (pediatric): Secondary | ICD-10-CM | POA: Diagnosis not present

## 2017-02-21 DIAGNOSIS — I10 Essential (primary) hypertension: Secondary | ICD-10-CM

## 2017-02-21 NOTE — Patient Instructions (Addendum)
-   Will power is a Research scientist (life sciences).  The two biggest killers of will power:    - Low blood sugar.    - Sleep deprivation.    - A consistent schedule of SLEEP and EATING will be very beneficial to your overall wellbeing and to weight management.    - Make a list of 7-10 dinner meals that taste good, are relatively quick and easy to prepare, and that meet your nutritional needs.  Use this as a basis for shopping, so you can make one of these meals any time.  Bring your list to your follow-up appointment for review.    - Set aside some time each week to plan (at least roughly) meals and snacks for the week.    Goals: 1. Obtain twice as many veg's as protein or carbohydrate foods for both lunch and dinner.  - You may modify the carb portion as you choose based on the [employer].   2. Increase water, and limit diet drinks to one per day.   3. Get at least 8,000 steps per day.    Maintaining Motivation: - Make sure you pay attention to satisfaction from eating.   - Complete your Goals Sheet, and bring to follow-up appt.   - Exercise variety:  Look into swim options in town: [employer], [place] (summer).

## 2017-02-21 NOTE — Progress Notes (Signed)
Medical Nutrition Therapy:  Appt start time: 9038 end time:  1630.  Assessment:  Primary concerns today: Weight management and BP management (E66.01, BMI 40-49 and I10, HTN).   [name] is interested weight loss as well as lowering BP.  He has gained most of his weight in the past 5-6 yrs, now working night shift, usually going in at 6:45 PM, and working till 7 AM or 1:15 AM, and with alternating time from one week to the next.  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  Plans to retire at the end of June.  He also has to work overtime fairly frequently.    He said his inconsistent sleep schedule and fatigue impact his food intake.  He is trying to make better choices now, but intake has been very erratic both in terms of amount of food and eating times.  He has been trying to follow the [employer] fr the past week, which he has used in the past to lose weight.    He had a pacemaker placed in Feb 2018, then worked 96 hours one week in March, which precluded any exercise.  Mr. Hopfensperger has OSA, but uses a CPAP consistently.    Learning Readiness: Ready  Usual eating pattern includes 3 meals and 3-4 snacks per day: breakfast when home from work ~7-8 AM, another meal 5:30 PM, 3rd meal at midnight.  Eats out only about once a week.   Frequent foods and beverages include diet sweet tea (~48 oz/day), water, veg's (grn bns, broc, asparagus, salads), poultry, fish, and seafood.  Avoided foods include some veg's.   Usual physical activity includes walking ~6,000 steps, usually at work.  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    24-hr recall: (Up at 8 AM) B (8 AM)-   ~1/3 egg substitutes, 1-2 oz Canadian bacon, 4 oz tomato juice Snk (10 AM)-   1 lettuce wrap with [place], bell pepper, smear of ranch, water  L (2 PM)-  2 c veget chili, diet sweet tea Snk (4 PM)-  2 X lettuce wrap with [place], bell pepper, smear of ranch, water   D (7 PM)-  Mimi's rest: grilled shrimp, grn beans, broc, water Snk ( PM)-  --- Typical day? Yes.  Typical for just the past week; on Phase 1 of Holt (very restrictive in carb's).    Progress Towards Goal(s):  In progress.   Nutritional Diagnosis:  [place]-3.3 Overweight/obesity As related to energy balance.  As evidenced by BMI >40.    Intervention:  Nutrition education  Handouts given during visit include:  AVS  Goals Sheet (emailed, to be merged with tracking form for medications)  Demonstrated degree of understanding via:  Teach Back  Barriers to learning/adherence to lifestyle change: consistency in exercise as well as history of pretty quickly losing  motivation to continue good food choices.    Monitoring/Evaluation:  Dietary intake, exercise, and body weight in 6 week(s).

## 2017-03-04 ENCOUNTER — Encounter: Payer: Self-pay | Admitting: Internal Medicine

## 2017-03-19 ENCOUNTER — Encounter: Payer: Commercial Managed Care - HMO | Admitting: Internal Medicine

## 2017-03-22 ENCOUNTER — Telehealth: Payer: Self-pay | Admitting: *Deleted

## 2017-03-22 ENCOUNTER — Ambulatory Visit (INDEPENDENT_AMBULATORY_CARE_PROVIDER_SITE_OTHER): Payer: Commercial Managed Care - HMO | Admitting: Physician Assistant

## 2017-03-22 VITALS — BP 130/84 | Ht 76.0 in | Wt 330.0 lb

## 2017-03-22 DIAGNOSIS — I441 Atrioventricular block, second degree: Secondary | ICD-10-CM | POA: Diagnosis not present

## 2017-03-22 DIAGNOSIS — I10 Essential (primary) hypertension: Secondary | ICD-10-CM | POA: Diagnosis not present

## 2017-03-22 DIAGNOSIS — Z950 Presence of cardiac pacemaker: Secondary | ICD-10-CM

## 2017-03-22 NOTE — Telephone Encounter (Signed)
Monitor is successfully reconnected per [employer].

## 2017-03-22 NOTE — Telephone Encounter (Signed)
Patient called to give his Carelink monitor SN.  SN successfully entered in [place].  Patient attempted to send a manual transmission but had poor cell signal in his room.  Patient will plan to try to move his monitor around his house to find a better signal and will send a manual transmission.  Advised patient that I will call him back if transmission is not received.  He verbalizes understanding and appreciation.

## 2017-03-22 NOTE — Patient Instructions (Addendum)
Medication Instructions:   Your physician recommends that you continue on your current medications as directed. Please refer to the Current Medication list given to you today.   If you need a refill on your cardiac medications before your next appointment, please call your pharmacy.  Labwork: NONE ORDERED  TODAY'   Testing/Procedures: NONE ORDERED  TODAY    Follow-Up: Your physician wants you to follow-up in:  IN [place]. You will receive a reminder letter in the mail two months in advance. If you don't receive a letter, please call our office to schedule the follow-up appointment.  Remote monitoring is used to monitor your Pacemaker of ICD from home. This monitoring reduces the number of office visits required to check your device to one time per year. It allows [place] to keep an eye on the functioning of your device to ensure it is working properly. You are scheduled for a device check from home on . 06-24-17..You may send your transmission at any time that day. If you have a wireless device, the transmission will be sent automatically. After your physician reviews your transmission, you will receive a postcard with your next transmission date.      Any Other Special Instructions Will Be Listed Below (If Applicable).   [place] AT [facility] # OF HOME MONITOR

## 2017-03-22 NOTE — Progress Notes (Signed)
Cardiology Office Note Date:  03/22/2017  Patient ID:  Juan Hanson [birth year], MRN [id number] PCP:  Antony Contras, MD  Cardiologist:  Dr. Wynonia Lawman Electrophysiologist: Dr. Lovena Le:     Chief Complaint: routine post PPM implant vist  History of Present Illness: Juan Hanson is a 61 y.o. male with history of symptomatic AVBlock, (exercised induced) 2:1 AVBlock w/PPM implant Feb 2018, HTN, DM, RBBB, OSA w/CPAP.  He comes in today to be seen for Dr. Lovena Le.  Last seen by him post implant day in Feb.  Dr. Tanna Furry H&P note remarks he had a EST developing 2:1 AVBlock, and hypertensive heart disease to add on BB post PPM.  The patient tells me he had an echo done as well was reported to have "good" heart strength and a slightly leaky valve.  He is feeling well, initially he had some muscle twitching post implant but this has settled away (and could not be reproduced today with his device check).  He denies any site pain or complication.  No CP, palpitations or SOB, no dizziness, near syncope or syncope.  He remains generally fatigued, this is chronic and has been attributed to working nights with an irregular schedule/shifts. He has no hx of syncope.   Device information MDT dual chamber PPM (HIS lead), implanted 12/18/16, Dr. Lovena Le, 2:1AVBlock   Past Medical History:  Diagnosis Date  . AV block, 2nd degree    /notes 12/18/2016  . Headache    "had them when I woke up before getting my CPAP; never had migraines" (12/18/2016)  . HTN (hypertension)   . OSA on CPAP   . Pneumonia ~ 2012  . Presence of permanent cardiac pacemaker   . Right bundle branch block    /notes 12/18/2016    Past Surgical History:  Procedure Laterality Date  . COLONOSCOPY  X 2  . INSERT / REPLACE / REMOVE PACEMAKER  12/18/2016  . PACEMAKER IMPLANT N/A 12/18/2016   Procedure: Pacemaker Implant;  Surgeon: Evans Lance, MD;  Location: [facility];  Service: Cardiovascular;  Laterality: N/A;     Current Outpatient Prescriptions  Medication Sig Dispense Refill  . amLODipine (NORVASC) 5 MG tablet Take 1 tablet by mouth daily.    Marland Kitchen losartan-hydrochlorothiazide (HYZAAR) 100-25 MG tablet Take 1 tablet by mouth daily.     No current facility-administered medications for this visit.     Allergies:   Patient has no known allergies.   Social History:  The patient  reports that he has never smoked. He quit smokeless tobacco use about 9 months ago. His smokeless tobacco use included Snuff. He reports that he drinks alcohol. He reports that he does not use drugs.   Family History:  The patient's family history includes Arthritis in his mother; Colon cancer in his father; Healthy in his brother and sister; Lung cancer in his father and mother.  ROS:  Please see the history of present illness.  All other systems are reviewed and otherwise negative.   PHYSICAL EXAM:  VS:  BP 130/84   Ht 6\' 4"  (1.93 m)   Wt (!) 330 lb (149.7 kg)   BMI 40.17 kg/m  BMI: Body mass index is 40.17 kg/m. Well nourished, well developed, in no acute distress  HEENT: normocephalic, atraumatic  Neck: no JVD, carotid bruits or masses Cardiac:  RRR; no significant murmurs, no rubs, or gallops Lungs:  CTA b/l  no wheezing, rhonchi or rales  Abd: soft, nontender MS: no deformity or  atrophy Ext: no edema  Skin: warm and dry, no rash Neuro:  No gross deficits appreciated Psych: euthymic mood, full affect  PPM site is stable, no tethering or discomfort   EKG: 12/19/16: SR, RBBB PPM interrogation done today by industry reported and discussed with Dr. Caryl Comes: battery and lead measurements are good, HIS capture is  1.5, and chronic setting reduced to 2.5V, given the patient is not dependent.  One 2 second NSVT  Recent Labs: 12/12/2016: BUN 18; Creatinine, Ser 0.94; Platelets 224; Potassium 4.4; Sodium 142  No results found for requested labs within last 8760 hours.   CrCl cannot be calculated (Patient's most  recent lab result is older than the maximum 21 days allowed.).   Wt Readings from Last 3 Encounters:  03/22/17 (!) 330 lb (149.7 kg)  02/21/17 (!) 333 lb 12.8 oz (151.4 kg)  12/19/16 (!) 328 lb 7.8 oz (149 kg)     Other studies reviewed: Additional studies/records reviewed today include: summarized above  ASSESSMENT AND PLAN:  1. AV block s/p PPM (exercise induced)     Device check today is discussed with Dr. Caryl Comes programming     The patient is asked to call with his home monitor serial number and establish remote monitoring, will plan to continue pacer management and annual visits for Pacer here, he will continue otherwise with Dr. Wynonia Lawman.  2. HTN, hypertensive heart disease is described in chart     Fair control by today's reading, her reports since the addition of HCTZ his home readings are 110-130/70-80 typically     Disposition: F/u with he will call and establish remote transmissions, Q 66month remote checks, Dr. Lovena Le in 21mo, sooner if needed.  Current medicines are reviewed at length with the patient today.  The patient did not have any concerns regarding medicines.  Haywood Lasso, PA-C 03/22/2017 6:57 PM     [place] [address] [phone] (office)  [phone] (fax)

## 2017-03-26 DIAGNOSIS — I119 Hypertensive heart disease without heart failure: Secondary | ICD-10-CM | POA: Diagnosis not present

## 2017-03-26 DIAGNOSIS — I4510 Unspecified right bundle-branch block: Secondary | ICD-10-CM | POA: Diagnosis not present

## 2017-03-26 DIAGNOSIS — G4733 Obstructive sleep apnea (adult) (pediatric): Secondary | ICD-10-CM | POA: Diagnosis not present

## 2017-04-09 ENCOUNTER — Ambulatory Visit (INDEPENDENT_AMBULATORY_CARE_PROVIDER_SITE_OTHER): Payer: Commercial Managed Care - HMO | Admitting: Family Medicine

## 2017-04-09 ENCOUNTER — Encounter: Payer: Self-pay | Admitting: Family Medicine

## 2017-04-09 DIAGNOSIS — I10 Essential (primary) hypertension: Secondary | ICD-10-CM

## 2017-04-09 NOTE — Progress Notes (Signed)
Medical Nutrition Therapy:  Appt start time: 8841 end time:  1630.  Assessment:  Primary concerns today: Weight management and BP management (E66.01, BMI 40-49 and I10, HTN).   Juan Hanson has been tracking his behavioral goals daily using a form he created himself.  He did not usually make his goal of 8,000 steps per day, but has made several significant changes.  He joined [employer], and discovered he is not able to swim b/c of his shoulder weakness.  He looked into water exercises, however, and has done 30 min of water exercise 3 times in the last 2 weeks.  He plans to do his water exercise 5 X wk after retiring June 29.     With respect to food choices, he had been doing well until the past 2 weeks, when he's had to work extra, and has relied on fast food several times.  He had some meals he knows were clearly excessive (e.g., 1100 kcal at one [employer] meal).  He had lost ~10 lb, but has regained some.  Juan Hanson to follow a [employer], not doing [place] any more, but tracking both kcal and g of CHO.  As of May 7, he has been drinking unsweetened tea, so is consuming no diet drinks any more.  He has been getting at least 64 oz of water daily.     Right after Juan Hanson's last nutr appt, he started to do much better on his sleep.  This all fell apart when he started getting called in for extra work in the past 2 weeks.    Learning Readiness: Ready    24-hr recall suggests intake of ~2000 kcal, 160 g CHO, 31 g sugar:  (Up at  AM) B (7 AM)-  Egg substitute, Canadian bacon, cheese, low-Na V8 (225kcal, 10 g CHO) - Usual breakfast.  Snk ( AM)-   L (12 PM)-  1 reuben sandw, unswt tea,     (680 kcal, 62 g CHO) - Usually has a salad. Snk ( PM)-   D ( PM)-   6 oz beef stir-fry, 3 c veg's, 1 c rice   (600 kcal,  80 g CHO) - Typical dinner.   Snk ( PM)-  almonds, 2 X Dannon Lite&Fit yogurt   (500 kcal, 28 g CHO) Typical day? Yes.  except in terms of sugar intake, which is usually lower.    Progress  Towards Goal(s):  In progress.   Nutritional Diagnosis:   [place]-3.3 Overweight/obesity As related to energy balance.  As evidenced by BMI >40.    Intervention:  Nutrition education  Handouts given during visit include:  After-visit summary (AVS)  Demonstrated degree of understanding via:  Teach Back  Barriers to learning/adherence to lifestyle change: consistency in exercise as well as history of pretty quickly losing  motivation to continue good food choices.    Monitoring/Evaluation:  Dietary intake, exercise, and body weight in 6 week(s).

## 2017-04-09 NOTE — Patient Instructions (Addendum)
-   Congratulations on making some good choices! - Keep up with your tracking form.    - After you have retired:  1. Focus on better sleep quantity and quality.   2. Be consistent with meal times and composition.  3. Get at least 30 min of exercise 5 days a week in addition to walking.  - A good goal is also to be intermittently active through the day.      Let's follow up in early August to see how you are doing (post-retirement)!

## 2017-04-21 IMAGING — DX DG CHEST 2V
2 series · 2 of 2 positions shown · non-contrast
Comparison: None.

CLINICAL DATA: Pacemaker placement

EXAM:
CHEST  2 VIEW

[chest pa]
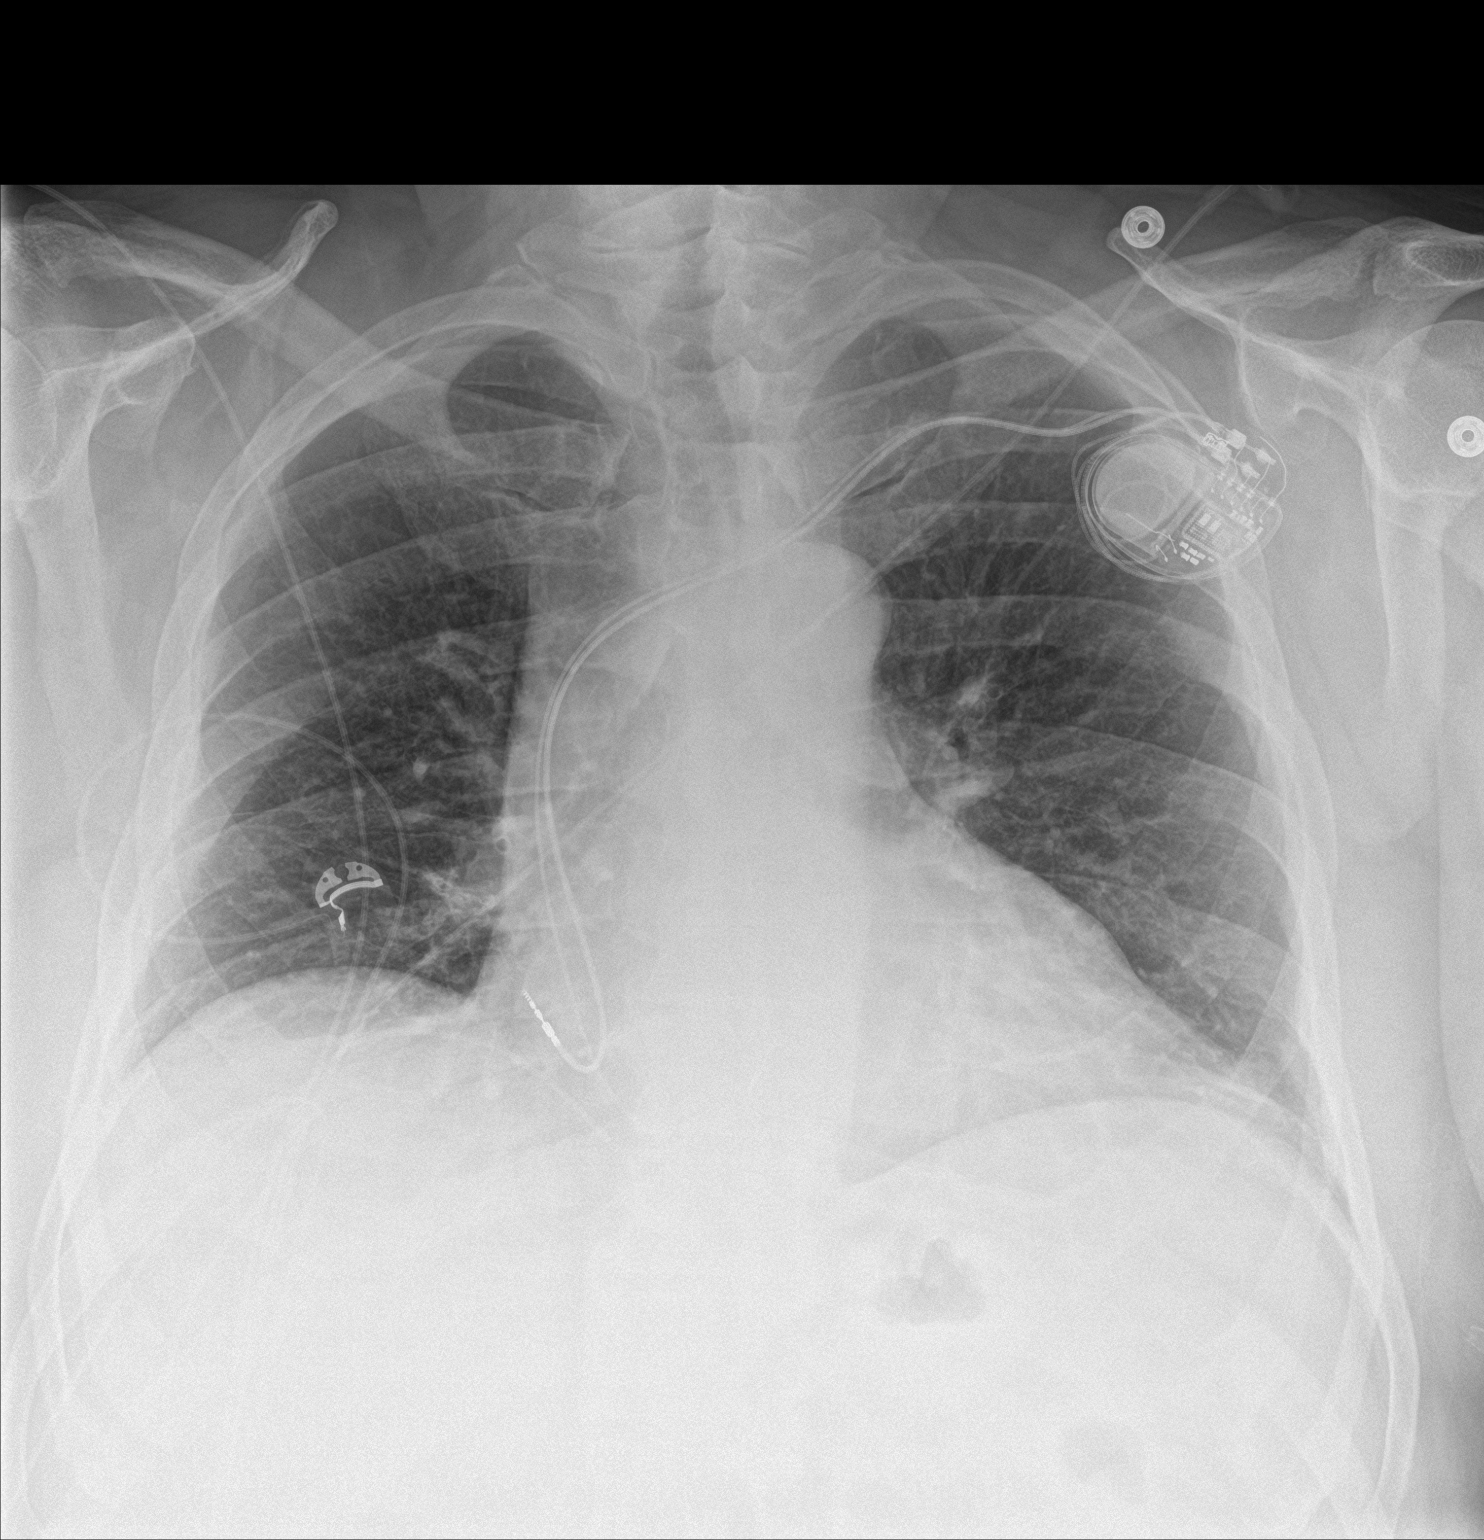

[chest lat]
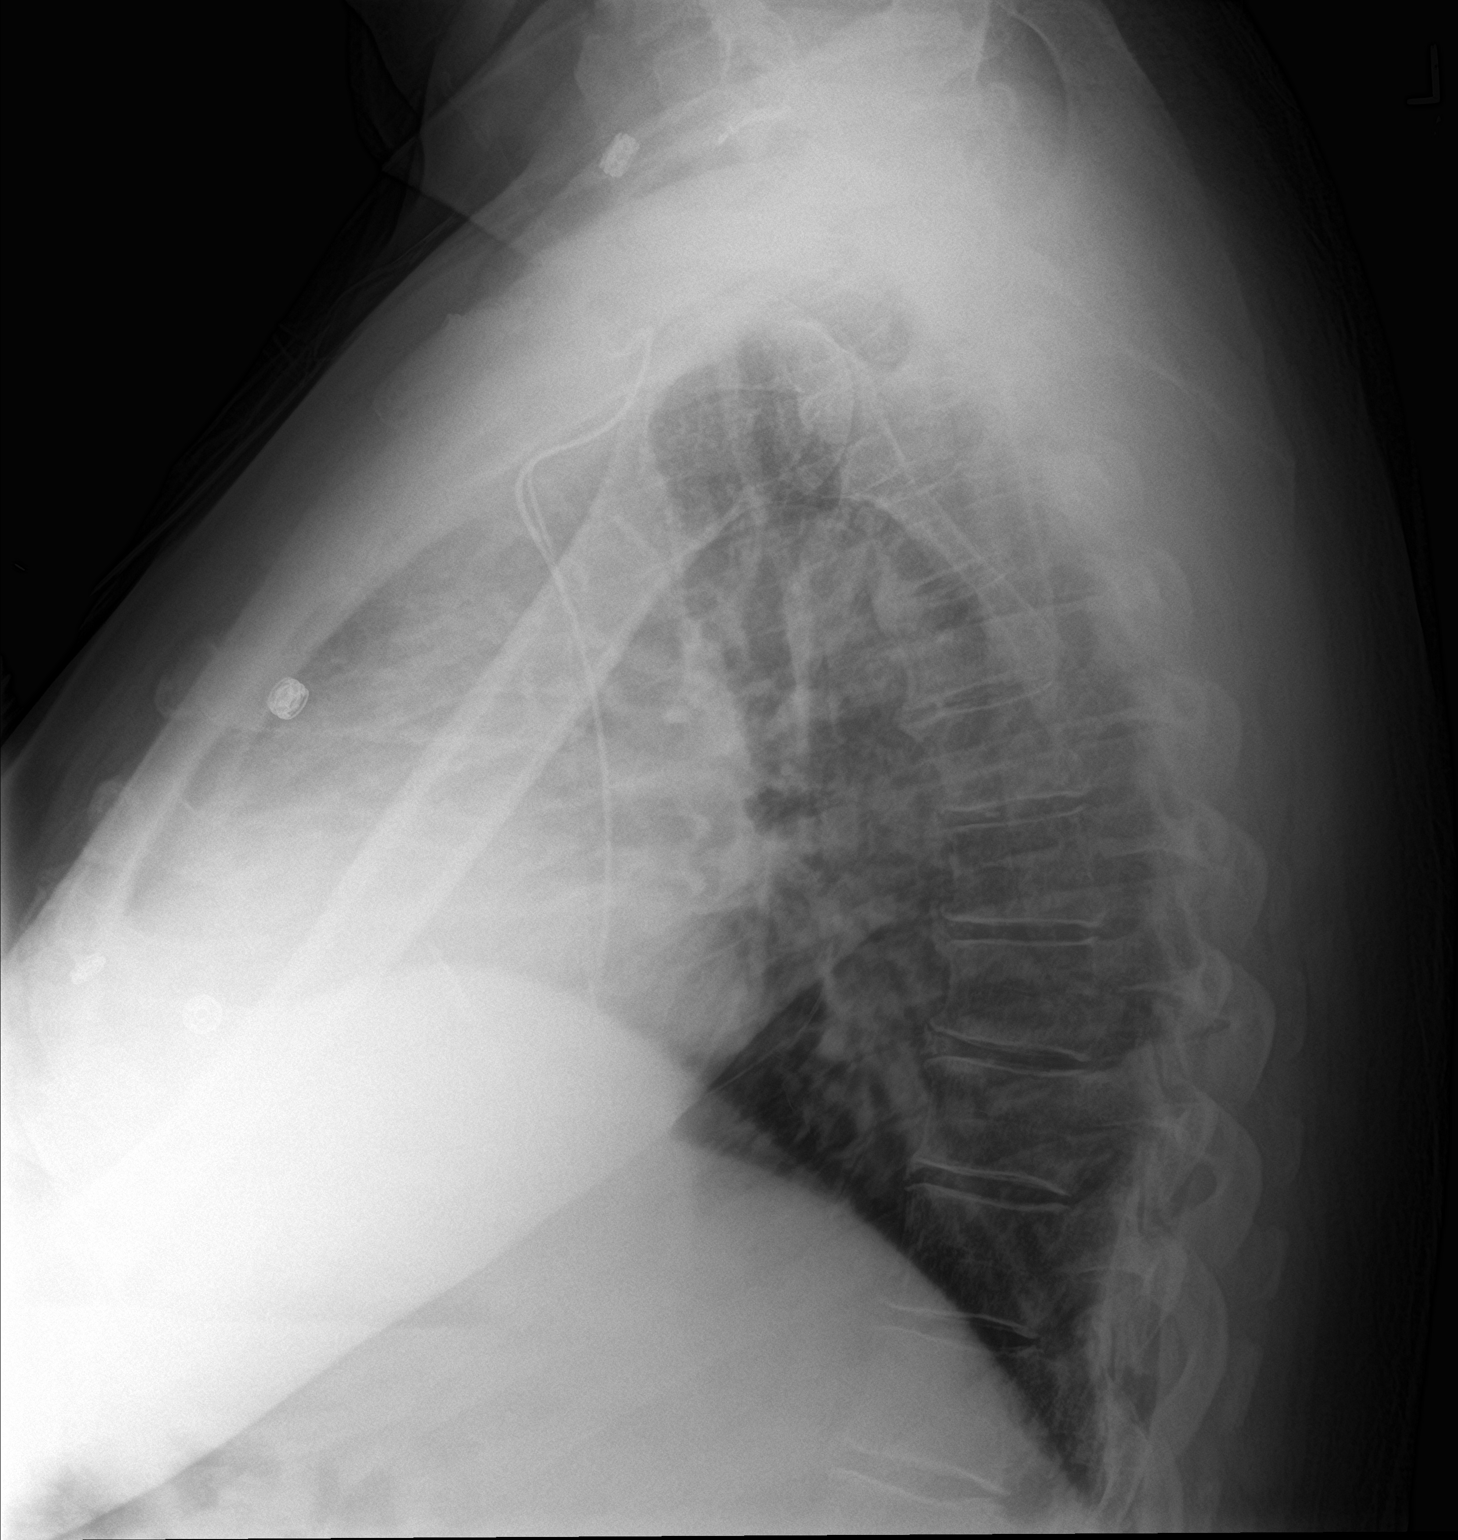

[2 of 2 positions shown; findings below may reference images not displayed]

FINDINGS: Double lead left subclavian pacemaker device has been placed. One
lead is clearly visualized and the tip is in the right atrium. The
other lead is obscured in the mediastinum and cannot be seen. There
is no pneumothorax. Lungs are under aerated and clear. Normal heart
size.
IMPRESSION: Double lead left subclavian vein pacemaker placement without ensuing
pneumothorax.

## 2017-05-24 NOTE — Progress Notes (Unsigned)
Subject consented to enrolled on 05/16/2017 [employer] Eval. [employer] on subject phone. Teaching provided to subject along with [employer] contact number. Subject verbalized understanding

## 2017-06-03 ENCOUNTER — Ambulatory Visit (INDEPENDENT_AMBULATORY_CARE_PROVIDER_SITE_OTHER): Payer: 59 | Admitting: Family Medicine

## 2017-06-03 ENCOUNTER — Ambulatory Visit (INDEPENDENT_AMBULATORY_CARE_PROVIDER_SITE_OTHER): Payer: 59 | Admitting: *Deleted

## 2017-06-03 ENCOUNTER — Encounter: Payer: Self-pay | Admitting: Family Medicine

## 2017-06-03 DIAGNOSIS — Z950 Presence of cardiac pacemaker: Secondary | ICD-10-CM

## 2017-06-03 DIAGNOSIS — I10 Essential (primary) hypertension: Secondary | ICD-10-CM | POA: Diagnosis not present

## 2017-06-03 NOTE — Patient Instructions (Addendum)
Concerns re. juicing:   1. When you omit the fiber from your fruits and veg's, you are risking rise in blood sugar levels.   2. If you add up the calories from all the ingredients in your juice drinks, they will likely be higher than if you just ate a normal lunch consisting of a lean protein, some veg's, and a moderate amt of carb foods. 3. You may find that DRINKING your food vs. eating it may be less satisfying; studies show that liquid calories do not satisfy our appetite as effectively as foods that require chewing.    Recommendations: 1. Eat a larger breakfast, including a meaningful amt of protein (a bit more of the eggs?), and include a small or moderate portion of carbohydrate (which one of your drinks could be).   2. Limit juiced drinks to 1-2 per day, in which you get both fruit and some veg's like leafy greens.  3. Eat a lunch that includes veg's, protein, and some carb.    If you do these above recommendations, you WILL lose weight, and it's likely to be more sustainable.     SLEEP:  Try working toward getting to bed earlier.  To make this happen, set an alarm so you don't sleep more than 90 min.   Move that alarm up by ~15 min per week.    - Document your nightly hours of sleep.

## 2017-06-03 NOTE — Progress Notes (Addendum)
Medical Nutrition Therapy:  Appt start time: 3212 end time:  1630.  Assessment:  Primary concerns today: Weight management and BP management (E66.01, BMI 40-49 and I10, HTN).   Mr. Espinoza has retired, and he has been making a stronger effort to eat better and to exercise regularly.  He has started to juice fruit and veg's; eating a breakfast of eggs and bacon, using a juiced drink for lunch, and eating a large salad with protein for dinner.  This routine was in response to the recommendation to increase vegetables, and he found a website promoting juicing for weight loss (Joe Elinor Parkinson' Reboot program), which recommends juiced drinks exclusively, five times daily.  He was surprised to discover the amt of sugar in these juiced drinks at http://www.walter.org/.   A drink Tim has most days includes 1 cucumber, 4 celery, 1 apple, 1 lemon, 8 leaves kale, ~2" ginger root.  He had cut out all refined sugars Others he likes includes 1/2 pnapple, yellow pepper, and ginger root; or 2 apples, 3 carrots, ginger root.    Tim lost a lot of weight initially, but weight increased last week, possibly as he started using more fruit in the drinks, as well as caving in and having a pizza (last night).  Weight is still down from June, however, and BP has been lower, likely influenced by all factors: weight loss, consistent exercise, and dramatically increased potassium intake.    Octavia Bruckner is still not on a normal sleep schedule, but has made progress on this since retiring.  Usually goes to bed at 11 PM or midnight, and gets up ~4:30 AM to workout at 5 AM M-F.  Naps in the afternoon b/c he just can't keep his eyes open.    Recent physical activity has included water running & water exercise 30(+) min 5 X wk, and walking 20-30 min outside 5 X wk.  He sometimes walks also on weekends.    Usual juicing ingredients he consumes in a day includes 1 whole bunch of kale, 2 cucumbers, 4-5 carrots, 1 orange, 6 apples, 6-8 celery stalks, 4-6 tomatoes, 3  lemons, 1 lime, 3" ginger.  Brkfst is 1/2 c scrmbld eggs, cheese, 2 slc bacon, Dinner is a large salad with 4 oz chx or [place] or shrimp, and 1-2 tbsp low-fat Caesar drsng.  He snacks on a handful nuts 2-3 X wk.   M-F routine:  4:30 Up 5 AM- Water exercise at [employer], followed by walking at [facility].  7 AM- breakfast from bar at [facility] with one juiced drink 10 AM- juiced drink  12 PM- juiced drink 1-3 PM- nap 3 PM- juiced drink 7 PM- Dinner of large salad with meat and juiced drink 12 AM- to bed  Progress Towards Goal(s):  In progress.   Nutritional Diagnosis:   [place]-3.3 Overweight/obesity As related to energy balance.  As evidenced by weight loss of ~9 lb since June.    Intervention:  Nutrition education  Handouts given during visit include:  After-visit summary (AVS)  Demonstrated degree of understanding via:  Teach Back  Barriers to learning/adherence to lifestyle change: consistency in exercise as well as history of pretty quickly losing  motivation to continue good food choices.    Monitoring/Evaluation:  Dietary intake, exercise, and body weight in 5 week(s).

## 2017-06-05 ENCOUNTER — Encounter: Payer: Self-pay | Admitting: Cardiology

## 2017-06-05 NOTE — Progress Notes (Signed)
Remote pacemaker transmission.   

## 2017-06-07 ENCOUNTER — Telehealth: Payer: Self-pay

## 2017-06-07 DIAGNOSIS — I10 Essential (primary) hypertension: Secondary | ICD-10-CM | POA: Diagnosis not present

## 2017-06-07 DIAGNOSIS — G4733 Obstructive sleep apnea (adult) (pediatric): Secondary | ICD-10-CM | POA: Diagnosis not present

## 2017-06-07 NOTE — Telephone Encounter (Signed)
Spoke with patient.  Advised that as the app is on his phone, as long as he has it turned on and with him, the app running in the background, and cellular signal for at least 3 hours that day, it should transmit automatically.  Advised we will contact him if it does not come through automatically.  Patient appreciative and denies additional questions or concerns at this time.

## 2017-06-07 NOTE — Telephone Encounter (Signed)
Received voice mail message from patient requesting a call back to change the remote transmission that is scheduled for 8/20.  He said he will be out of town that day.   Message forwarded to device clinic

## 2017-06-10 DIAGNOSIS — G4733 Obstructive sleep apnea (adult) (pediatric): Secondary | ICD-10-CM | POA: Diagnosis not present

## 2017-06-17 ENCOUNTER — Ambulatory Visit (INDEPENDENT_AMBULATORY_CARE_PROVIDER_SITE_OTHER): Payer: Self-pay | Admitting: *Deleted

## 2017-06-17 DIAGNOSIS — I441 Atrioventricular block, second degree: Secondary | ICD-10-CM

## 2017-06-20 NOTE — Progress Notes (Signed)
Remote pacemaker transmission.   

## 2017-06-24 ENCOUNTER — Ambulatory Visit (INDEPENDENT_AMBULATORY_CARE_PROVIDER_SITE_OTHER): Payer: Self-pay | Admitting: *Deleted

## 2017-06-24 DIAGNOSIS — Z950 Presence of cardiac pacemaker: Secondary | ICD-10-CM

## 2017-06-24 NOTE — Progress Notes (Signed)
Remote pacemaker transmission.   

## 2017-06-25 LAB — CUP PACEART REMOTE DEVICE CHECK
Implantable Lead Implant Date: 20180220
Implantable Lead Location: 753860
Implantable Lead Model: 3830
Implantable Lead Model: 5076
Implantable Pulse Generator Implant Date: 20180220
MDC IDC LEAD IMPLANT DT: 20180220
MDC IDC LEAD LOCATION: 753859
MDC IDC SESS DTM: 20180828122518

## 2017-06-27 LAB — CUP PACEART REMOTE DEVICE CHECK
Implantable Lead Implant Date: 20180220
Implantable Lead Location: 753860
Implantable Lead Model: 3830
Implantable Pulse Generator Implant Date: 20180220
MDC IDC LEAD IMPLANT DT: 20180220
MDC IDC LEAD LOCATION: 753859
MDC IDC SESS DTM: 20180830151946

## 2017-06-28 ENCOUNTER — Encounter: Payer: Self-pay | Admitting: Cardiology

## 2017-07-05 ENCOUNTER — Encounter: Payer: Self-pay | Admitting: Cardiology

## 2017-07-16 ENCOUNTER — Ambulatory Visit (INDEPENDENT_AMBULATORY_CARE_PROVIDER_SITE_OTHER): Payer: 59 | Admitting: Family Medicine

## 2017-07-16 ENCOUNTER — Encounter: Payer: Self-pay | Admitting: Family Medicine

## 2017-07-16 DIAGNOSIS — I10 Essential (primary) hypertension: Secondary | ICD-10-CM

## 2017-07-16 NOTE — Progress Notes (Signed)
Medical Nutrition Therapy:  Appt start time: 0900 end time:  1000.  Assessment:  Primary concerns today: Weight management and BP management (E66.01, BMI 40-49 and I10, HTN).   Juan Hanson has been on a more normal sleep schedule; getting to bed ~11 PM, and up at 7 or 7:30 AM.  Juan Hanson he feels really good with the amt of sleep he is getting.  Food cravings are virtually nonexistent, which he attributes to the sleep he is getting.  He has reduced juicing; is only getting a couple of juiced drinks per week.  Weight is stable, but will likely fall as he resumes exercise routine.    Recent physical activity has included water exercise 2 X wk, and walking 2 miles (~30 min) 2 X wk, down significantly from what he was doing before.  He got out of his routine while visiting his son in [place] in August (3-day drive each way), but identified increasing exercise as a goal early in today's visit.    24-hr recall:  (Up at 7 AM) B (8 AM)-  2 scrmbld eggs, pepper, onion, mushrm, zucchini, tea Snk ( AM)-  water L (1:30 PM)-  Romaine, chinese broc, 4 oz chx, peppers, onion, 2 tsp drsng, water Snk ( PM)-  --- D (6:30 PM)-  Cristy Friedlander (frozen), black tea Snk ( PM)-  --- Typical day? No.  Wasn't especially hungry yesterday.  Sometimes has sandwich for lunch.  Aims for 1500-2000 kcal per day.  Consumes 80 oz water per day.  Estimates he misses breakfast about once every 10 days.    Progress Towards Goal(s):  In progress.   Nutritional Diagnosis:  Stable progress on [place]-3.3 Overweight/obesity As related to energy balance.  As evidenced by no further weight loss since August, but continued healthy food choices.    Intervention:  Nutrition education  Handouts given during visit include:  After-visit summary (AVS)  Demonstrated degree of understanding via:  Teach Back  Barriers to learning/adherence to lifestyle change: consistency in exercise as well as history of pretty quickly losing  motivation to continue good food choices.     Monitoring/Evaluation:  Dietary intake, exercise, and body weight prn.

## 2017-07-16 NOTE — Patient Instructions (Addendum)
Goals: 1. Water exercise and walking at least 5 X week (30 min each).    Get at least 8,000 steps per day.  2. Continue to make good food choices:   - Protein with each meal  - Limiting carb foods to some degree (not eliminated)  - Vegetables at least twice a day - Reminder: Planning is the key to good food choices.  - Call if any questions, or if you'd like to schedule a follow-up appt: [phone].

## 2017-07-18 DIAGNOSIS — Z0100 Encounter for examination of eyes and vision without abnormal findings: Secondary | ICD-10-CM | POA: Diagnosis not present

## 2017-07-24 DIAGNOSIS — Z86010 Personal history of colonic polyps: Secondary | ICD-10-CM | POA: Diagnosis not present

## 2017-07-25 LAB — CUP PACEART REMOTE DEVICE CHECK
Battery Remaining Longevity: 143 mo
Brady Statistic AP VS Percent: 2.65 %
Date Time Interrogation Session: 20180827032454
Implantable Lead Implant Date: 20180220
Implantable Lead Model: 3830
Lead Channel Pacing Threshold Pulse Width: 0.4 ms
Lead Channel Pacing Threshold Pulse Width: 0.4 ms
Lead Channel Sensing Intrinsic Amplitude: 2 mV
Lead Channel Sensing Intrinsic Amplitude: 3.375 mV
Lead Channel Sensing Intrinsic Amplitude: 3.375 mV
Lead Channel Setting Pacing Amplitude: 2.5 V
MDC IDC LEAD IMPLANT DT: 20180220
MDC IDC LEAD LOCATION: 753859
MDC IDC LEAD LOCATION: 753860
MDC IDC MSMT BATTERY VOLTAGE: 3.12 V
MDC IDC MSMT LEADCHNL RA IMPEDANCE VALUE: 323 Ohm
MDC IDC MSMT LEADCHNL RA IMPEDANCE VALUE: 589 Ohm
MDC IDC MSMT LEADCHNL RA PACING THRESHOLD AMPLITUDE: 0.75 V
MDC IDC MSMT LEADCHNL RV IMPEDANCE VALUE: 304 Ohm
MDC IDC MSMT LEADCHNL RV IMPEDANCE VALUE: 684 Ohm
MDC IDC MSMT LEADCHNL RV PACING THRESHOLD AMPLITUDE: 1.375 V
MDC IDC MSMT LEADCHNL RV SENSING INTR AMPL: 2 mV
MDC IDC PG IMPLANT DT: 20180220
MDC IDC SET LEADCHNL RA PACING AMPLITUDE: 1.5 V
MDC IDC SET LEADCHNL RV PACING PULSEWIDTH: 1 ms
MDC IDC SET LEADCHNL RV SENSING SENSITIVITY: 0.9 mV
MDC IDC STAT BRADY AP VP PERCENT: 24.66 %
MDC IDC STAT BRADY AS VP PERCENT: 14.96 %
MDC IDC STAT BRADY AS VS PERCENT: 57.73 %
MDC IDC STAT BRADY RA PERCENT PACED: 27.22 %
MDC IDC STAT BRADY RV PERCENT PACED: 39.62 %

## 2017-08-22 ENCOUNTER — Ambulatory Visit (INDEPENDENT_AMBULATORY_CARE_PROVIDER_SITE_OTHER): Payer: Self-pay | Admitting: *Deleted

## 2017-08-22 DIAGNOSIS — Z950 Presence of cardiac pacemaker: Secondary | ICD-10-CM

## 2017-08-22 NOTE — Progress Notes (Signed)
Remote pacemaker transmission.   

## 2017-08-23 ENCOUNTER — Encounter: Payer: Self-pay | Admitting: Cardiology

## 2017-08-23 LAB — CUP PACEART REMOTE DEVICE CHECK
Battery Remaining Longevity: 138 mo
Battery Voltage: 3.08 V
Brady Statistic RA Percent Paced: 26.45 %
Implantable Lead Location: 753859
Implantable Lead Location: 753860
Implantable Lead Model: 3830
Implantable Lead Model: 5076
Implantable Pulse Generator Implant Date: 20180220
Lead Channel Pacing Threshold Pulse Width: 0.4 ms
Lead Channel Sensing Intrinsic Amplitude: 3.75 mV
Lead Channel Sensing Intrinsic Amplitude: 3.75 mV
Lead Channel Setting Sensing Sensitivity: 0.9 mV
MDC IDC LEAD IMPLANT DT: 20180220
MDC IDC LEAD IMPLANT DT: 20180220
MDC IDC MSMT LEADCHNL RA IMPEDANCE VALUE: 323 Ohm
MDC IDC MSMT LEADCHNL RA IMPEDANCE VALUE: 589 Ohm
MDC IDC MSMT LEADCHNL RA PACING THRESHOLD AMPLITUDE: 0.875 V
MDC IDC MSMT LEADCHNL RV IMPEDANCE VALUE: 304 Ohm
MDC IDC MSMT LEADCHNL RV IMPEDANCE VALUE: 646 Ohm
MDC IDC MSMT LEADCHNL RV PACING THRESHOLD AMPLITUDE: 1.375 V
MDC IDC MSMT LEADCHNL RV PACING THRESHOLD PULSEWIDTH: 0.4 ms
MDC IDC MSMT LEADCHNL RV SENSING INTR AMPL: 1.875 mV
MDC IDC MSMT LEADCHNL RV SENSING INTR AMPL: 1.875 mV
MDC IDC SESS DTM: 20181025022805
MDC IDC SET LEADCHNL RA PACING AMPLITUDE: 1.75 V
MDC IDC SET LEADCHNL RV PACING AMPLITUDE: 2.5 V
MDC IDC SET LEADCHNL RV PACING PULSEWIDTH: 1 ms
MDC IDC STAT BRADY AP VP PERCENT: 25.41 %
MDC IDC STAT BRADY AP VS PERCENT: 1.38 %
MDC IDC STAT BRADY AS VP PERCENT: 32.84 %
MDC IDC STAT BRADY AS VS PERCENT: 40.37 %
MDC IDC STAT BRADY RV PERCENT PACED: 58.25 %

## 2017-09-12 DIAGNOSIS — G4733 Obstructive sleep apnea (adult) (pediatric): Secondary | ICD-10-CM | POA: Diagnosis not present

## 2017-09-26 DIAGNOSIS — I4510 Unspecified right bundle-branch block: Secondary | ICD-10-CM | POA: Diagnosis not present

## 2017-09-26 DIAGNOSIS — I119 Hypertensive heart disease without heart failure: Secondary | ICD-10-CM | POA: Diagnosis not present

## 2017-09-26 DIAGNOSIS — Z950 Presence of cardiac pacemaker: Secondary | ICD-10-CM | POA: Diagnosis not present

## 2017-10-02 DIAGNOSIS — Z86010 Personal history of colonic polyps: Secondary | ICD-10-CM | POA: Diagnosis not present

## 2017-10-02 DIAGNOSIS — D126 Benign neoplasm of colon, unspecified: Secondary | ICD-10-CM | POA: Diagnosis not present

## 2017-11-21 ENCOUNTER — Ambulatory Visit (INDEPENDENT_AMBULATORY_CARE_PROVIDER_SITE_OTHER): Payer: 59 | Admitting: *Deleted

## 2017-11-21 DIAGNOSIS — I441 Atrioventricular block, second degree: Secondary | ICD-10-CM | POA: Diagnosis not present

## 2017-11-21 NOTE — Progress Notes (Signed)
Remote pacemaker transmission.   

## 2017-11-22 ENCOUNTER — Encounter: Payer: Self-pay | Admitting: Cardiology

## 2017-12-11 LAB — CUP PACEART REMOTE DEVICE CHECK
Battery Remaining Longevity: 127 mo
Battery Voltage: 3.04 V
Brady Statistic AP VP Percent: 34.34 %
Brady Statistic AP VS Percent: 0.67 %
Brady Statistic AS VS Percent: 28.94 %
Brady Statistic RV Percent Paced: 70.39 %
Date Time Interrogation Session: 20190124084807
Implantable Lead Implant Date: 20180220
Implantable Lead Location: 753860
Lead Channel Impedance Value: 323 Ohm
Lead Channel Impedance Value: 342 Ohm
Lead Channel Impedance Value: 494 Ohm
Lead Channel Impedance Value: 532 Ohm
Lead Channel Pacing Threshold Amplitude: 0.625 V
Lead Channel Pacing Threshold Amplitude: 1.375 V
Lead Channel Pacing Threshold Pulse Width: 0.4 ms
Lead Channel Sensing Intrinsic Amplitude: 2.625 mV
Lead Channel Sensing Intrinsic Amplitude: 2.75 mV
Lead Channel Setting Pacing Amplitude: 1.5 V
Lead Channel Setting Pacing Amplitude: 2.5 V
Lead Channel Setting Sensing Sensitivity: 0.9 mV
MDC IDC LEAD IMPLANT DT: 20180220
MDC IDC LEAD LOCATION: 753859
MDC IDC MSMT LEADCHNL RA PACING THRESHOLD PULSEWIDTH: 0.4 ms
MDC IDC MSMT LEADCHNL RA SENSING INTR AMPL: 2.625 mV
MDC IDC MSMT LEADCHNL RV SENSING INTR AMPL: 2.75 mV
MDC IDC PG IMPLANT DT: 20180220
MDC IDC SET LEADCHNL RV PACING PULSEWIDTH: 1 ms
MDC IDC STAT BRADY AS VP PERCENT: 36.05 %
MDC IDC STAT BRADY RA PERCENT PACED: 34.84 %

## 2017-12-23 DIAGNOSIS — D1724 Benign lipomatous neoplasm of skin and subcutaneous tissue of left leg: Secondary | ICD-10-CM | POA: Diagnosis not present

## 2017-12-23 DIAGNOSIS — Z1159 Encounter for screening for other viral diseases: Secondary | ICD-10-CM | POA: Diagnosis not present

## 2017-12-23 DIAGNOSIS — I10 Essential (primary) hypertension: Secondary | ICD-10-CM | POA: Diagnosis not present

## 2017-12-23 DIAGNOSIS — G4733 Obstructive sleep apnea (adult) (pediatric): Secondary | ICD-10-CM | POA: Diagnosis not present

## 2017-12-23 DIAGNOSIS — Z0000 Encounter for general adult medical examination without abnormal findings: Secondary | ICD-10-CM | POA: Diagnosis not present

## 2017-12-23 DIAGNOSIS — Z125 Encounter for screening for malignant neoplasm of prostate: Secondary | ICD-10-CM | POA: Diagnosis not present

## 2017-12-30 ENCOUNTER — Encounter: Payer: 59 | Admitting: Internal Medicine

## 2017-12-31 DIAGNOSIS — G4733 Obstructive sleep apnea (adult) (pediatric): Secondary | ICD-10-CM | POA: Diagnosis not present

## 2018-01-07 DIAGNOSIS — R229 Localized swelling, mass and lump, unspecified: Secondary | ICD-10-CM | POA: Insufficient documentation

## 2018-01-07 DIAGNOSIS — M25862 Other specified joint disorders, left knee: Secondary | ICD-10-CM | POA: Diagnosis not present

## 2018-01-07 NOTE — Progress Notes (Signed)
Cardiology Office Note Date:  01/07/2018  Patient ID:  Aylan, Bayona 21-Aug-1956, MRN [id number] PCP:  Antony Contras, MD  Cardiologist:  Dr. Wynonia Lawman Electrophysiologist: Dr. Lovena Le:     Chief Complaint: 1 year post implant visit  History of Present Illness: Juan Hanson is a 62 y.o. male with history of symptomatic AVBlock, (exercised induced) 2:1 AVBlock w/PPM implant Feb 2018, HTN, DM, RBBB, OSA w/CPAP.  He comes in today to be seen for Dr. Lovena Le.  Last seen by him post implant day in Feb.  Dr. Tanna Furry H&P note remarks he had a EST developing 2:1 AVBlock, and hypertensive heart disease to add on BB post PPM.  The patient tells me he had an echo done as well was reported to have "good" heart strength and a slightly leaky valve.  He was seen by myself for an annual visit in May 2018, feeling well, initially he had some muscle twitching post implant but this had settled away (and could not be reproduced at the visit with his device check).  He denied any site pain or complication.  No CP, palpitations or SOB, no dizziness, near syncope or syncope.  He remained generally fatigued, though was described as chronic and had been attributed to working nights with an irregular schedule/shifts. He has no hx of syncope.   He follows with Dr. Wynonia Lawman for cardiology.  Is feeling well.  No CP, palpitations or SOB, no dizziness, near syncope or syncope.  He is frustrated with his weight, mentioning he does great with diet/exercsies loses several pounds then "falls off the wagon" and puts it back on.  He has developed a "growth" on the side of his knee, seeing orthopedics inquires about getting an MRI done with his device.   Device information MDT dual chamber PPM (HIS lead), implanted 12/18/16, Dr. Lovena Le, 2:1AVBlock   Past Medical History:  Diagnosis Date  . AV block, 2nd degree    /notes 12/18/2016  . Headache    "had them when I woke up before getting my CPAP; never had migraines"  (12/18/2016)  . HTN (hypertension)   . OSA on CPAP   . Pneumonia ~ 2012  . Presence of permanent cardiac pacemaker   . Right bundle branch block    /notes 12/18/2016    Past Surgical History:  Procedure Laterality Date  . COLONOSCOPY  X 2  . INSERT / REPLACE / REMOVE PACEMAKER  12/18/2016  . PACEMAKER IMPLANT N/A 12/18/2016   Procedure: Pacemaker Implant;  Surgeon: Evans Lance, MD;  Location: [facility];  Service: Cardiovascular;  Laterality: N/A;    Current Outpatient Medications  Medication Sig Dispense Refill  . amLODipine (NORVASC) 5 MG tablet Take 1 tablet by mouth daily.    Marland Kitchen losartan-hydrochlorothiazide (HYZAAR) 100-25 MG tablet Take 1 tablet by mouth daily.     No current facility-administered medications for this visit.     Allergies:   Patient has no known allergies.   Social History:  The patient  reports that  has never smoked. He quit smokeless tobacco use about 18 months ago. His smokeless tobacco use included snuff. He reports that he drinks alcohol. He reports that he does not use drugs.   Family History:  The patient's family history includes Arthritis in his mother; Colon cancer in his father; Healthy in his brother and sister; Lung cancer in his father and mother.  ROS:  Please see the history of present illness.  All other systems are reviewed  and otherwise negative.   PHYSICAL EXAM:  VS:  There were no vitals taken for this visit. BMI: There is no height or weight on file to calculate BMI. Well nourished, well developed, in no acute distress  HEENT: normocephalic, atraumatic  Neck: no JVD, carotid bruits or masses Cardiac:  RRR; no significant murmurs, no rubs, or gallops Lungs:  CTA b/l  no wheezing, rhonchi or rales  Abd: soft, nontender MS: no deformity or atrophy Ext: no edema  Skin: warm and dry, no rash Neuro:  No gross deficits appreciated Psych: euthymic mood, full affect   PPM site is stable, no tethering or discomfort   EKG:  SR, 73bpm, RBBB (old) PPM interrogation done today and reviewed by myself:  Battery, RA lead measurements are good, HIS lead sensing/impedane are stable, threshold is up. He comes in programmed unipolar at 2.5V/1.58ms, thresholds today: Unipolar 2.75/1.40ms >> programed  4.0V/1.66ms developed pocket stimulation Bipolar 3.75V/1.46ms  He was left at 4.75V/1.52ms, bipolar He is not device dependent     Recent Labs: No results found for requested labs within last 8760 hours.  No results found for requested labs within last 8760 hours.   CrCl cannot be calculated (Patient's most recent lab result is older than the maximum 21 days allowed.).   Wt Readings from Last 3 Encounters:  07/16/17 (!) 323 lb 12.8 oz (146.9 kg)  06/03/17 (!) 323 lb 6.4 oz (146.7 kg)  04/09/17 (!) 332 lb (150.6 kg)     Other studies reviewed: Additional studies/records reviewed today include: summarized above  ASSESSMENT AND PLAN:  1. AV block s/p PPM (exercise induced)     In d/w A. Lynnell Jude, NP, left device programmed as above     He will see Dr. Lovena Le Monday to re-evaluate   2. HTN, hypertensive heart disease is described in chart     Looks good today, no changes  3. Pending MRI     His device and leas are MRI conditional, no abandoned leads     Initially programmed unipolar this would not allow MRI without temp re-programming out puts     Pending his visit with Dr. Lovena Le and final programming     Disposition: Dr. Lovena Le Monday.  Current medicines are reviewed at length with the patient today.  The patient did not have any concerns regarding medicines.  Haywood Lasso, PA-C 01/07/2018 5:02 PM     [facility] [address] [place] [address] [phone] (office)  [phone] (fax)

## 2018-01-09 ENCOUNTER — Other Ambulatory Visit (HOSPITAL_COMMUNITY): Payer: Self-pay | Admitting: Orthopedic Surgery

## 2018-01-09 ENCOUNTER — Encounter: Payer: Self-pay | Admitting: Physician Assistant

## 2018-01-09 ENCOUNTER — Ambulatory Visit: Payer: 59 | Admitting: Physician Assistant

## 2018-01-09 VITALS — BP 122/76 | HR 73 | Ht 75.0 in | Wt 332.4 lb

## 2018-01-09 DIAGNOSIS — Z950 Presence of cardiac pacemaker: Secondary | ICD-10-CM | POA: Diagnosis not present

## 2018-01-09 DIAGNOSIS — M25869 Other specified joint disorders, unspecified knee: Secondary | ICD-10-CM

## 2018-01-09 DIAGNOSIS — I10 Essential (primary) hypertension: Secondary | ICD-10-CM | POA: Diagnosis not present

## 2018-01-09 NOTE — Patient Instructions (Addendum)
Medication Instructions:   Your physician recommends that you continue on your current medications as directed. Please refer to the Current Medication list given to you today.   If you need a refill on your cardiac medications before your next appointment, please call your pharmacy.  Labwork:  NONE ORDERED  TODAY    Testing/Procedures: NONE ORDERED  TODAY    Follow-Up:  WITH  DR. Lovena Le  Monday    Any Other Special Instructions Will Be Listed Below (If Applicable).                                                                                                                                                 '

## 2018-01-13 ENCOUNTER — Ambulatory Visit (INDEPENDENT_AMBULATORY_CARE_PROVIDER_SITE_OTHER): Payer: 59 | Admitting: Internal Medicine

## 2018-01-13 ENCOUNTER — Encounter: Payer: Self-pay | Admitting: Internal Medicine

## 2018-01-13 VITALS — BP 126/64 | HR 70 | Ht 75.0 in | Wt 325.0 lb

## 2018-01-13 DIAGNOSIS — I10 Essential (primary) hypertension: Secondary | ICD-10-CM | POA: Diagnosis not present

## 2018-01-13 DIAGNOSIS — Z950 Presence of cardiac pacemaker: Secondary | ICD-10-CM | POA: Diagnosis not present

## 2018-01-13 DIAGNOSIS — I441 Atrioventricular block, second degree: Secondary | ICD-10-CM | POA: Diagnosis not present

## 2018-01-13 LAB — CUP PACEART INCLINIC DEVICE CHECK
Brady Statistic AP VS Percent: 0.44 %
Brady Statistic AS VP Percent: 29.45 %
Implantable Lead Model: 3830
Implantable Lead Model: 5076
Lead Channel Impedance Value: 323 Ohm
Lead Channel Impedance Value: 475 Ohm
Lead Channel Impedance Value: 494 Ohm
Lead Channel Pacing Threshold Amplitude: 0.75 V
Lead Channel Pacing Threshold Pulse Width: 1 ms
Lead Channel Sensing Intrinsic Amplitude: 1.375 mV
Lead Channel Sensing Intrinsic Amplitude: 1.75 mV
Lead Channel Sensing Intrinsic Amplitude: 2.75 mV
Lead Channel Setting Pacing Amplitude: 3.5 V
Lead Channel Setting Pacing Pulse Width: 1 ms
Lead Channel Setting Sensing Sensitivity: 0.9 mV
MDC IDC LEAD IMPLANT DT: 20180220
MDC IDC LEAD IMPLANT DT: 20180220
MDC IDC LEAD LOCATION: 753859
MDC IDC LEAD LOCATION: 753860
MDC IDC MSMT BATTERY REMAINING LONGEVITY: 94 mo
MDC IDC MSMT BATTERY VOLTAGE: 3.01 V
MDC IDC MSMT LEADCHNL RA PACING THRESHOLD PULSEWIDTH: 0.4 ms
MDC IDC MSMT LEADCHNL RA SENSING INTR AMPL: 3.375 mV
MDC IDC MSMT LEADCHNL RV IMPEDANCE VALUE: 304 Ohm
MDC IDC MSMT LEADCHNL RV PACING THRESHOLD AMPLITUDE: 2.75 V
MDC IDC PG IMPLANT DT: 20180220
MDC IDC SESS DTM: 20190318124747
MDC IDC SET LEADCHNL RA PACING AMPLITUDE: 1.5 V
MDC IDC STAT BRADY AP VP PERCENT: 19.15 %
MDC IDC STAT BRADY AS VS PERCENT: 50.96 %
MDC IDC STAT BRADY RA PERCENT PACED: 19.43 %
MDC IDC STAT BRADY RV PERCENT PACED: 48.6 %

## 2018-01-13 NOTE — Patient Instructions (Addendum)
Medication Instructions:  Your physician recommends that you continue on your current medications as directed. Please refer to the Current Medication list given to you today.  Labwork: None ordered.  Testing/Procedures: None ordered.  Follow-Up: Your physician wants you to follow-up in: early/mid June with Dr. Lovena Le.     Remote monitoring is used to monitor your Pacemaker from home. This monitoring reduces the number of office visits required to check your device to one time per year. It allows [place] to keep an eye on the functioning of your device to ensure it is working properly. You are scheduled for a device check from home on 02/20/2018. You may send your transmission at any time that day. If you have a wireless device, the transmission will be sent automatically. After your physician reviews your transmission, you will receive a postcard with your next transmission date.  Any Other Special Instructions Will Be Listed Below (If Applicable).  If you need a refill on your cardiac medications before your next appointment, please call your pharmacy.

## 2018-01-13 NOTE — Progress Notes (Signed)
HPI [name] returns today for followup of his ppm in the setting of 2:1 AV block. He feels well although he has had trouble with dietary indiscretion and weight gain. He has become more sedentary. He denies chest pain, sob, or syncope. He has not felt dizzy or lightheaded. He has noted some peripheral edema.  No Known Allergies   Current Outpatient Medications  Medication Sig Dispense Refill  . amLODipine (NORVASC) 10 MG tablet Take 10 mg by mouth daily.    Marland Kitchen losartan-hydrochlorothiazide (HYZAAR) 100-25 MG tablet Take 1 tablet by mouth daily.     No current facility-administered medications for this visit.      Past Medical History:  Diagnosis Date  . AV block, 2nd degree    /notes 12/18/2016  . Headache    "had them when I woke up before getting my CPAP; never had migraines" (12/18/2016)  . HTN (hypertension)   . OSA on CPAP   . Pneumonia ~ 2012  . Presence of permanent cardiac pacemaker   . Right bundle branch block    /notes 12/18/2016    ROS:   All systems reviewed and negative except as noted in the HPI.   Past Surgical History:  Procedure Laterality Date  . COLONOSCOPY  X 2  . INSERT / REPLACE / REMOVE PACEMAKER  12/18/2016  . PACEMAKER IMPLANT N/A 12/18/2016   Procedure: Pacemaker Implant;  Surgeon: Evans Lance, MD;  Location: [facility];  Service: Cardiovascular;  Laterality: N/A;     Family History  Problem Relation Age of Onset  . Lung cancer Mother   . Arthritis Mother   . Colon cancer Father   . Lung cancer Father   . Healthy Brother        x3  . Healthy Sister      Social History   Socioeconomic History  . Marital status: Divorced    Spouse name: Not on file  . Number of children: 2  . Years of education: Not on file  . Highest education level: Not on file  Social Needs  . Financial resource strain: Not on file  . Food insecurity - worry: Not on file  . Food insecurity - inability: Not on file  . Transportation needs -  medical: Not on file  . Transportation needs - non-medical: Not on file  Occupational History  . Not on file  Tobacco Use  . Smoking status: Never Smoker  . Smokeless tobacco: Former Systems developer    Types: Snuff  Substance and Sexual Activity  . Alcohol use: Yes    Alcohol/week: 0.0 oz    Comment: 12/18/2016 "<10 drinks/year"  . Drug use: No  . Sexual activity: Not Currently  Other Topics Concern  . Not on file  Social History Narrative  . Not on file     BP 126/64   Pulse 70   Ht 6\' 3"  (1.905 m)   Wt (!) 325 lb (147.4 kg)   BMI 40.62 kg/m   Physical Exam:  Well appearing 62 yo man, NAD HEENT: Unremarkable Neck:  6 cm JVD, no thyromegally Lymphatics:  No adenopathy Back:  No CVA tenderness Lungs:  Clear with no wheezes HEART:  Regular rate rhythm, no murmurs, no rubs, no clicks Abd:  soft, positive bowel sounds, no organomegally, no rebound, no guarding Ext:  2 plus pulses, no edema, no cyanosis, no clubbing Skin:  No rashes no nodules Neuro:  CN II through XII intact, motor grossly  intact  EKG - reviewed from last week  DEVICE  Normal device function.  See PaceArt for details.   Assess/Plan: 1. PPM - his His bundle lead has an elevated threshold. We discussed the options for treatment. Today his threshold is a little better. I have recommended we watch his lead for now. I have reprogrammed his device to minimize pacing. We will discuss and reconsider lead revision when I see him next. 2. Heart block - he is conducting 1:1 today. Will follow.  3. Obesity - his has gained back 30 lbs. I have encouraged him to reduce his calorie consumption and to exercise. 4. HTN - his blood pressure is well controlled today. We will follow.  Mikle Bosworth.D.

## 2018-01-14 ENCOUNTER — Telehealth: Payer: Self-pay | Admitting: Internal Medicine

## 2018-01-14 NOTE — Telephone Encounter (Signed)
Mr. Capell is calling to ask for follow up call regarding his new device. Please call [phone]  Thanks

## 2018-01-14 NOTE — Telephone Encounter (Signed)
Spoke with pt, informed him that the programming changes made yesterday were likely the cause of him feeling the device pace him. Informed him that he was programmed unipolar to conserve battery life and that programming of unipolar paces a larger area of the heart than his previous programming and he could possibly feel it. Informed pt that if this was an isolated incident then its unlikely that he needs to be brought in for programming changes. Encouraged pt to call clinic back if this feeling happens again and to make a note of when it happens and what he was doing as the time as this helps [place] to troubleshoot what the issue is pt voiced understanding.

## 2018-01-28 DIAGNOSIS — M25862 Other specified joint disorders, left knee: Secondary | ICD-10-CM | POA: Diagnosis not present

## 2018-01-29 ENCOUNTER — Encounter: Payer: Self-pay | Admitting: Internal Medicine

## 2018-02-20 ENCOUNTER — Ambulatory Visit (INDEPENDENT_AMBULATORY_CARE_PROVIDER_SITE_OTHER): Payer: 59 | Admitting: *Deleted

## 2018-02-20 DIAGNOSIS — I441 Atrioventricular block, second degree: Secondary | ICD-10-CM | POA: Diagnosis not present

## 2018-02-21 ENCOUNTER — Encounter: Payer: Self-pay | Admitting: Cardiology

## 2018-02-21 NOTE — Progress Notes (Signed)
Remote pacemaker transmission.   

## 2018-03-03 DIAGNOSIS — M25862 Other specified joint disorders, left knee: Secondary | ICD-10-CM | POA: Diagnosis not present

## 2018-03-03 DIAGNOSIS — I10 Essential (primary) hypertension: Secondary | ICD-10-CM | POA: Diagnosis not present

## 2018-03-03 DIAGNOSIS — D481 Neoplasm of uncertain behavior of connective and other soft tissue: Secondary | ICD-10-CM | POA: Diagnosis not present

## 2018-03-03 DIAGNOSIS — L720 Epidermal cyst: Secondary | ICD-10-CM | POA: Diagnosis not present

## 2018-03-03 HISTORY — PX: SOFT TISSUE MASS EXCISION: SHX2419

## 2018-03-18 LAB — CUP PACEART REMOTE DEVICE CHECK
Brady Statistic AP VP Percent: 16.22 %
Brady Statistic AP VS Percent: 1.29 %
Brady Statistic AS VP Percent: 5.8 %
Date Time Interrogation Session: 20190425032727
Implantable Lead Location: 753859
Implantable Lead Location: 753860
Implantable Lead Model: 3830
Implantable Lead Model: 5076
Implantable Pulse Generator Implant Date: 20180220
Lead Channel Impedance Value: 304 Ohm
Lead Channel Impedance Value: 323 Ohm
Lead Channel Pacing Threshold Pulse Width: 0.4 ms
Lead Channel Sensing Intrinsic Amplitude: 1.5 mV
Lead Channel Sensing Intrinsic Amplitude: 1.5 mV
Lead Channel Sensing Intrinsic Amplitude: 3.125 mV
Lead Channel Setting Pacing Amplitude: 3.5 V
Lead Channel Setting Pacing Pulse Width: 1 ms
MDC IDC LEAD IMPLANT DT: 20180220
MDC IDC LEAD IMPLANT DT: 20180220
MDC IDC MSMT BATTERY REMAINING LONGEVITY: 91 mo
MDC IDC MSMT BATTERY VOLTAGE: 3.01 V
MDC IDC MSMT LEADCHNL RA IMPEDANCE VALUE: 399 Ohm
MDC IDC MSMT LEADCHNL RA PACING THRESHOLD AMPLITUDE: 0.5 V
MDC IDC MSMT LEADCHNL RA PACING THRESHOLD PULSEWIDTH: 0.4 ms
MDC IDC MSMT LEADCHNL RA SENSING INTR AMPL: 3.125 mV
MDC IDC MSMT LEADCHNL RV IMPEDANCE VALUE: 418 Ohm
MDC IDC MSMT LEADCHNL RV PACING THRESHOLD AMPLITUDE: 2.5 V
MDC IDC SET LEADCHNL RA PACING AMPLITUDE: 1.5 V
MDC IDC SET LEADCHNL RV SENSING SENSITIVITY: 0.9 mV
MDC IDC STAT BRADY AS VS PERCENT: 76.69 %
MDC IDC STAT BRADY RA PERCENT PACED: 17.51 %
MDC IDC STAT BRADY RV PERCENT PACED: 22.02 %

## 2018-04-08 ENCOUNTER — Ambulatory Visit: Payer: 59 | Admitting: Internal Medicine

## 2018-04-08 ENCOUNTER — Encounter: Payer: Self-pay | Admitting: Internal Medicine

## 2018-04-08 VITALS — BP 122/78 | HR 62 | Ht 76.0 in | Wt 309.0 lb

## 2018-04-08 DIAGNOSIS — I441 Atrioventricular block, second degree: Secondary | ICD-10-CM

## 2018-04-08 DIAGNOSIS — I10 Essential (primary) hypertension: Secondary | ICD-10-CM

## 2018-04-08 DIAGNOSIS — Z950 Presence of cardiac pacemaker: Secondary | ICD-10-CM

## 2018-04-08 NOTE — Patient Instructions (Addendum)
Medication Instructions:  Your physician recommends that you continue on your current medications as directed. Please refer to the Current Medication list given to you today.  Labwork: None ordered.  Testing/Procedures: None ordered.  Follow-Up: Your physician wants you to follow-up in: 3 months with Dr. Lovena Le.  Remote monitoring is used to monitor your Pacemaker from home. This monitoring reduces the number of office visits required to check your device to one time per year. It allows [place] to keep an eye on the functioning of your device to ensure it is working properly. You are scheduled for a device check from home on 05/22/2018. You may send your transmission at any time that day. If you have a wireless device, the transmission will be sent automatically. After your physician reviews your transmission, you will receive a postcard with your next transmission date.  Any Other Special Instructions Will Be Listed Below (If Applicable).  If you need a refill on your cardiac medications before your next appointment, please call your pharmacy.

## 2018-04-08 NOTE — Progress Notes (Addendum)
HPI Juan Hanson returns today for followup of his ppm in the setting of 2:1 AV block. His activity has increased markedly since [name] was placed. He was noted at followup to have additional increases in his pacing threshold. His sensing is also borderline. He has lost 15 lbs as his activity has increased markedly. No Known Allergies   Current Outpatient Medications  Medication Sig Dispense Refill  . amLODipine (NORVASC) 10 MG tablet Take 10 mg by mouth daily.    Marland Kitchen losartan-hydrochlorothiazide (HYZAAR) 100-25 MG tablet Take 1 tablet by mouth daily.     No current facility-administered medications for this visit.      Past Medical History:  Diagnosis Date  . AV block, 2nd degree    /notes 12/18/2016  . Headache    "had them when I woke up before getting my CPAP; never had migraines" (12/18/2016)  . HTN (hypertension)   . OSA on CPAP   . Pneumonia ~ 2012  . Presence of permanent cardiac pacemaker   . Right bundle branch block    /notes 12/18/2016    ROS:   All systems reviewed and negative except as noted in the HPI.   Past Surgical History:  Procedure Laterality Date  . COLONOSCOPY  X 2  . INSERT / REPLACE / REMOVE PACEMAKER  12/18/2016  . PACEMAKER IMPLANT N/A 12/18/2016   Procedure: Pacemaker Implant;  Surgeon: Evans Lance, MD;  Location: [facility];  Service: Cardiovascular;  Laterality: N/A;     Family History  Problem Relation Age of Onset  . Lung cancer Mother   . Arthritis Mother   . Colon cancer Father   . Lung cancer Father   . Healthy Brother        x3  . Healthy Sister      Social History   Socioeconomic History  . Marital status: Divorced    Spouse name: Not on file  . Number of children: 2  . Years of education: Not on file  . Highest education level: Not on file  Occupational History  . Not on file  Social Needs  . Financial resource strain: Not on file  . Food insecurity:    Worry: Not on file    Inability: Not on file    . Transportation needs:    Medical: Not on file    Non-medical: Not on file  Tobacco Use  . Smoking status: Never Smoker  . Smokeless tobacco: Former Systems developer    Types: Snuff  Substance and Sexual Activity  . Alcohol use: Yes    Alcohol/week: 0.0 oz    Comment: 12/18/2016 "<10 drinks/year"  . Drug use: No  . Sexual activity: Not Currently  Lifestyle  . Physical activity:    Days per week: Not on file    Minutes per session: Not on file  . Stress: Not on file  Relationships  . Social connections:    Talks on phone: Not on file    Gets together: Not on file    Attends religious service: Not on file    Active member of club or organization: Not on file    Attends meetings of clubs or organizations: Not on file    Relationship status: Not on file  . Intimate partner violence:    Fear of current or ex partner: Not on file    Emotionally abused: Not on file    Physically abused: Not on file    Forced sexual  activity: Not on file  Other Topics Concern  . Not on file  Social History Narrative  . Not on file     BP 122/78   Pulse 62   Ht 6\' 4"  (1.93 m)   Wt (!) 309 lb (140.2 kg)   SpO2 95%   BMI 37.61 kg/m   Physical Exam:  Well appearing NAD HEENT: Unremarkable Neck:  No JVD, no thyromegally Lymphatics:  No adenopathy Back:  No CVA tenderness Lungs:  Clear with no wheezes HEART:  Regular rate rhythm, no murmurs, no rubs, no clicks Abd:  soft, positive bowel sounds, no organomegally, no rebound, no guarding Ext:  2 plus pulses, no edema, no cyanosis, no clubbing Skin:  No rashes no nodules Neuro:  CN II through XII intact, motor grossly intact  EKG - nsr with undersensing  DEVICE  Normal device function.  See PaceArt for details. We have reprogrammed his device to improve sensing.   Assess/Plan: 1. High grade heart block - he is conducting normally today. I have reprogrammed him to DDD 50 2. PPM - his medtronic PPM has a reduced bipolar R wave and thresholds are  increased both unipolar and bipolar. I have reprogrammed him unipolar sensing as R waves are 7 unipolar and 1 bipolar and unipolar pacing.  3. Obesity - he has lost 15 lbs and will continue to try and lose more.  4. Disp. - will decide on whether or not to revise his lead when I see him back in 3 months. I spent 28 minutes including 50% face to face time on this note. Mikle Bosworth.D.

## 2018-04-09 LAB — CUP PACEART INCLINIC DEVICE CHECK
Brady Statistic AP VP Percent: 27.6 %
Brady Statistic AP VS Percent: 1.48 %
Brady Statistic AS VP Percent: 12.75 %
Brady Statistic AS VS Percent: 58.18 %
Brady Statistic RV Percent Paced: 40.34 %
Date Time Interrogation Session: 20190611142759
Implantable Lead Location: 753860
Implantable Lead Model: 3830
Lead Channel Impedance Value: 304 Ohm
Lead Channel Impedance Value: 323 Ohm
Lead Channel Impedance Value: 399 Ohm
Lead Channel Impedance Value: 418 Ohm
Lead Channel Pacing Threshold Amplitude: 2.5 V
Lead Channel Pacing Threshold Pulse Width: 0.4 ms
Lead Channel Sensing Intrinsic Amplitude: 1.125 mV
Lead Channel Sensing Intrinsic Amplitude: 7.25 mV
Lead Channel Setting Pacing Amplitude: 5 V
Lead Channel Setting Pacing Pulse Width: 1 ms
Lead Channel Setting Sensing Sensitivity: 4 mV
MDC IDC LEAD IMPLANT DT: 20180220
MDC IDC LEAD IMPLANT DT: 20180220
MDC IDC LEAD LOCATION: 753859
MDC IDC MSMT BATTERY REMAINING LONGEVITY: 58 mo
MDC IDC MSMT BATTERY VOLTAGE: 3 V
MDC IDC MSMT LEADCHNL RA PACING THRESHOLD AMPLITUDE: 0.5 V
MDC IDC MSMT LEADCHNL RA PACING THRESHOLD PULSEWIDTH: 0.4 ms
MDC IDC MSMT LEADCHNL RA SENSING INTR AMPL: 3.5 mV
MDC IDC MSMT LEADCHNL RA SENSING INTR AMPL: 4 mV
MDC IDC PG IMPLANT DT: 20180220
MDC IDC SET LEADCHNL RA PACING AMPLITUDE: 1.5 V
MDC IDC STAT BRADY RA PERCENT PACED: 29.08 %

## 2018-04-15 ENCOUNTER — Encounter: Payer: 59 | Admitting: Internal Medicine

## 2018-04-15 DIAGNOSIS — L720 Epidermal cyst: Secondary | ICD-10-CM | POA: Insufficient documentation

## 2018-05-22 ENCOUNTER — Ambulatory Visit (INDEPENDENT_AMBULATORY_CARE_PROVIDER_SITE_OTHER): Payer: 59 | Admitting: *Deleted

## 2018-05-22 DIAGNOSIS — I441 Atrioventricular block, second degree: Secondary | ICD-10-CM

## 2018-05-22 NOTE — Progress Notes (Signed)
Remote pacemaker transmission.   

## 2018-06-23 DIAGNOSIS — G4733 Obstructive sleep apnea (adult) (pediatric): Secondary | ICD-10-CM | POA: Diagnosis not present

## 2018-06-23 DIAGNOSIS — I10 Essential (primary) hypertension: Secondary | ICD-10-CM | POA: Diagnosis not present

## 2018-06-23 DIAGNOSIS — M7989 Other specified soft tissue disorders: Secondary | ICD-10-CM | POA: Diagnosis not present

## 2018-07-02 LAB — CUP PACEART REMOTE DEVICE CHECK
Battery Remaining Longevity: 65 mo
Battery Voltage: 3.03 V
Brady Statistic AS VS Percent: 97.2 %
Brady Statistic RA Percent Paced: 2.56 %
Brady Statistic RV Percent Paced: 2.11 %
Implantable Lead Implant Date: 20180220
Implantable Lead Implant Date: 20180220
Implantable Lead Location: 753860
Implantable Lead Model: 3830
Implantable Pulse Generator Implant Date: 20180220
Lead Channel Impedance Value: 304 Ohm
Lead Channel Impedance Value: 456 Ohm
Lead Channel Impedance Value: 494 Ohm
Lead Channel Pacing Threshold Amplitude: 0.625 V
Lead Channel Pacing Threshold Amplitude: 2.5 V
Lead Channel Pacing Threshold Pulse Width: 0.4 ms
Lead Channel Sensing Intrinsic Amplitude: 3.625 mV
Lead Channel Setting Pacing Amplitude: 1.5 V
Lead Channel Setting Sensing Sensitivity: 4 mV
MDC IDC LEAD LOCATION: 753859
MDC IDC MSMT LEADCHNL RA IMPEDANCE VALUE: 323 Ohm
MDC IDC MSMT LEADCHNL RA SENSING INTR AMPL: 3.625 mV
MDC IDC MSMT LEADCHNL RV PACING THRESHOLD PULSEWIDTH: 0.4 ms
MDC IDC MSMT LEADCHNL RV SENSING INTR AMPL: 6.125 mV
MDC IDC MSMT LEADCHNL RV SENSING INTR AMPL: 6.125 mV
MDC IDC SESS DTM: 20190725041009
MDC IDC SET LEADCHNL RV PACING AMPLITUDE: 5 V
MDC IDC SET LEADCHNL RV PACING PULSEWIDTH: 1 ms
MDC IDC STAT BRADY AP VP PERCENT: 1.86 %
MDC IDC STAT BRADY AP VS PERCENT: 0.69 %
MDC IDC STAT BRADY AS VP PERCENT: 0.25 %

## 2018-07-11 ENCOUNTER — Ambulatory Visit (INDEPENDENT_AMBULATORY_CARE_PROVIDER_SITE_OTHER): Payer: 59 | Admitting: Internal Medicine

## 2018-07-11 ENCOUNTER — Encounter: Payer: Self-pay | Admitting: Internal Medicine

## 2018-07-11 VITALS — BP 134/80 | HR 79 | Ht 76.0 in | Wt 318.0 lb

## 2018-07-11 DIAGNOSIS — Z950 Presence of cardiac pacemaker: Secondary | ICD-10-CM | POA: Diagnosis not present

## 2018-07-11 DIAGNOSIS — T82118A Breakdown (mechanical) of other cardiac electronic device, initial encounter: Secondary | ICD-10-CM | POA: Diagnosis not present

## 2018-07-11 DIAGNOSIS — I441 Atrioventricular block, second degree: Secondary | ICD-10-CM | POA: Diagnosis not present

## 2018-07-11 DIAGNOSIS — I10 Essential (primary) hypertension: Secondary | ICD-10-CM

## 2018-07-11 NOTE — Progress Notes (Signed)
HPI Juan Hanson returns today for followup. He is a pleasant 62 yo man with morbid obesity, mobitz 2 second degree AV block, s/p PPM insertion. In the interim, he has been stable but notes that he had some fatigue. He has had progressive increases in his RV pacing threshold. We have programmed him not to pace in the ventricle if possible. He had lost weight but then gained back 20 lbs.  No Known Allergies   Current Outpatient Medications  Medication Sig Dispense Refill  . amLODipine (NORVASC) 10 MG tablet Take 10 mg by mouth daily.    Marland Kitchen losartan-hydrochlorothiazide (HYZAAR) 100-25 MG tablet Take 1 tablet by mouth daily.     No current facility-administered medications for this visit.      Past Medical History:  Diagnosis Date  . AV block, 2nd degree    /notes 12/18/2016  . Headache    "had them when I woke up before getting my CPAP; never had migraines" (12/18/2016)  . HTN (hypertension)   . OSA on CPAP   . Pneumonia ~ 2012  . Presence of permanent cardiac pacemaker   . Right bundle branch block    /notes 12/18/2016    ROS:   All systems reviewed and negative except as noted in the HPI.   Past Surgical History:  Procedure Laterality Date  . COLONOSCOPY  X 2  . INSERT / REPLACE / REMOVE PACEMAKER  12/18/2016  . PACEMAKER IMPLANT N/A 12/18/2016   Procedure: Pacemaker Implant;  Surgeon: Evans Lance, MD;  Location: [facility];  Service: Cardiovascular;  Laterality: N/A;     Family History  Problem Relation Age of Onset  . Lung cancer Mother   . Arthritis Mother   . Colon cancer Father   . Lung cancer Father   . Healthy Brother        x3  . Healthy Sister      Social History   Socioeconomic History  . Marital status: Divorced    Spouse name: Not on file  . Number of children: 2  . Years of education: Not on file  . Highest education level: Not on file  Occupational History  . Not on file  Social Needs  . Financial resource strain: Not on file   . Food insecurity:    Worry: Not on file    Inability: Not on file  . Transportation needs:    Medical: Not on file    Non-medical: Not on file  Tobacco Use  . Smoking status: Never Smoker  . Smokeless tobacco: Former Systems developer    Types: Snuff  Substance and Sexual Activity  . Alcohol use: Yes    Alcohol/week: 0.0 standard drinks    Comment: 12/18/2016 "<10 drinks/year"  . Drug use: No  . Sexual activity: Not Currently  Lifestyle  . Physical activity:    Days per week: Not on file    Minutes per session: Not on file  . Stress: Not on file  Relationships  . Social connections:    Talks on phone: Not on file    Gets together: Not on file    Attends religious service: Not on file    Active member of club or organization: Not on file    Attends meetings of clubs or organizations: Not on file    Relationship status: Not on file  . Intimate partner violence:    Fear of current or ex partner: Not on file    Emotionally  abused: Not on file    Physically abused: Not on file    Forced sexual activity: Not on file  Other Topics Concern  . Not on file  Social History Narrative  . Not on file     BP 134/80   Pulse 79   Ht 6\' 4"  (1.93 m)   Wt (!) 318 lb (144.2 kg)   SpO2 96%   BMI 38.71 kg/m   Physical Exam:  Well appearing NAD HEENT: Unremarkable Neck:  No JVD, no thyromegally Lymphatics:  No adenopathy Back:  No CVA tenderness Lungs:  Clear with no wheezes HEART:  Regular rate rhythm, no murmurs, no rubs, no clicks Abd:  soft, positive bowel sounds, no organomegally, no rebound, no guarding Ext:  2 plus pulses, no edema, no cyanosis, no clubbing Skin:  No rashes no nodules Neuro:  CN II through XII intact, motor grossly intact  EKG - none  DEVICE  Normal device function except as noted above.  See PaceArt for details.   Assess/Plan: 1. Heart block - he is currently conducting and with prolongation of his AV delay been ventricularly pacing only 5% of the time.  2.  PM - his RV threshold is increased and increasing. I have recommended we revise the lead but he is not sure he wants to proceed. He will call us if he changes his mind.  3. Obesity - I have strongly encouraged the patient to lose weight.  4. HTN - his blood pressure is up a bit despite being on 3 different meds. He is encouraged to lose weight and eat less sodium.  Mikle Bosworth.D.

## 2018-07-11 NOTE — Patient Instructions (Addendum)
Medication Instructions:  Your physician recommends that you continue on your current medications as directed. Please refer to the Current Medication list given to you today.  Labwork: None ordered.  Testing/Procedures: None ordered.  Follow-Up: Your physician wants you to follow-up in: 6 months with Dr. Lovena Le.   You will receive a reminder letter in the mail two months in advance. If you don't receive a letter, please call our office to schedule the follow-up appointment.  Remote monitoring is used to monitor your Pacemaker from home. This monitoring reduces the number of office visits required to check your device to one time per year. It allows [place] to keep an eye on the functioning of your device to ensure it is working properly. You are scheduled for a device check from home on 08/21/2018. You may send your transmission at any time that day. If you have a wireless device, the transmission will be sent automatically. After your physician reviews your transmission, you will receive a postcard with your next transmission date.   Any Other Special Instructions Will Be Listed Below (If Applicable).  If you need a refill on your cardiac medications before your next appointment, please call your pharmacy.

## 2018-08-12 LAB — CUP PACEART INCLINIC DEVICE CHECK
Battery Remaining Longevity: 46 mo
Battery Voltage: 3.03 V
Brady Statistic AP VS Percent: 0.72 %
Brady Statistic RA Percent Paced: 3.39 %
Brady Statistic RV Percent Paced: 2.87 %
Implantable Lead Implant Date: 20180220
Implantable Lead Implant Date: 20180220
Implantable Lead Location: 753860
Implantable Lead Model: 3830
Implantable Pulse Generator Implant Date: 20180220
Lead Channel Impedance Value: 304 Ohm
Lead Channel Impedance Value: 304 Ohm
Lead Channel Impedance Value: 361 Ohm
Lead Channel Pacing Threshold Amplitude: 0.5 V
Lead Channel Pacing Threshold Pulse Width: 0.4 ms
Lead Channel Pacing Threshold Pulse Width: 1 ms
Lead Channel Sensing Intrinsic Amplitude: 3 mV
MDC IDC LEAD LOCATION: 753859
MDC IDC MSMT LEADCHNL RV IMPEDANCE VALUE: 399 Ohm
MDC IDC MSMT LEADCHNL RV PACING THRESHOLD AMPLITUDE: 4 V
MDC IDC MSMT LEADCHNL RV SENSING INTR AMPL: 7.5 mV
MDC IDC SESS DTM: 20190913135434
MDC IDC SET LEADCHNL RA PACING AMPLITUDE: 1.5 V
MDC IDC SET LEADCHNL RV PACING AMPLITUDE: 6 V
MDC IDC SET LEADCHNL RV PACING PULSEWIDTH: 1 ms
MDC IDC SET LEADCHNL RV SENSING SENSITIVITY: 4 mV
MDC IDC STAT BRADY AP VP PERCENT: 2.67 %
MDC IDC STAT BRADY AS VP PERCENT: 0.2 %
MDC IDC STAT BRADY AS VS PERCENT: 96.41 %

## 2018-08-21 ENCOUNTER — Ambulatory Visit (INDEPENDENT_AMBULATORY_CARE_PROVIDER_SITE_OTHER): Payer: 59 | Admitting: *Deleted

## 2018-08-21 DIAGNOSIS — I441 Atrioventricular block, second degree: Secondary | ICD-10-CM

## 2018-08-21 NOTE — Progress Notes (Signed)
Remote pacemaker transmission.   

## 2018-08-26 ENCOUNTER — Telehealth: Payer: Self-pay

## 2018-08-26 NOTE — Telephone Encounter (Signed)
Called patient to complete annual [employer] survey.  

## 2018-09-12 LAB — CUP PACEART REMOTE DEVICE CHECK
Battery Remaining Longevity: 52 mo
Battery Voltage: 3.03 V
Brady Statistic AP VP Percent: 0.24 %
Brady Statistic AP VS Percent: 2.38 %
Brady Statistic AS VS Percent: 97.32 %
Brady Statistic RA Percent Paced: 2.57 %
Date Time Interrogation Session: 20191024095414
Implantable Lead Implant Date: 20180220
Implantable Lead Implant Date: 20180220
Implantable Lead Location: 753859
Lead Channel Impedance Value: 304 Ohm
Lead Channel Impedance Value: 323 Ohm
Lead Channel Pacing Threshold Amplitude: 0.5 V
Lead Channel Pacing Threshold Pulse Width: 0.4 ms
Lead Channel Pacing Threshold Pulse Width: 0.4 ms
Lead Channel Setting Pacing Amplitude: 1.5 V
Lead Channel Setting Sensing Sensitivity: 4 mV
MDC IDC LEAD LOCATION: 753860
MDC IDC MSMT LEADCHNL RA IMPEDANCE VALUE: 418 Ohm
MDC IDC MSMT LEADCHNL RA SENSING INTR AMPL: 3.5 mV
MDC IDC MSMT LEADCHNL RA SENSING INTR AMPL: 3.5 mV
MDC IDC MSMT LEADCHNL RV IMPEDANCE VALUE: 456 Ohm
MDC IDC MSMT LEADCHNL RV PACING THRESHOLD AMPLITUDE: 2.5 V
MDC IDC MSMT LEADCHNL RV SENSING INTR AMPL: 6.375 mV
MDC IDC MSMT LEADCHNL RV SENSING INTR AMPL: 6.375 mV
MDC IDC PG IMPLANT DT: 20180220
MDC IDC SET LEADCHNL RV PACING AMPLITUDE: 6 V
MDC IDC SET LEADCHNL RV PACING PULSEWIDTH: 1 ms
MDC IDC STAT BRADY AS VP PERCENT: 0.07 %
MDC IDC STAT BRADY RV PERCENT PACED: 0.31 %

## 2018-09-24 DIAGNOSIS — G4733 Obstructive sleep apnea (adult) (pediatric): Secondary | ICD-10-CM | POA: Diagnosis not present

## 2018-09-30 ENCOUNTER — Ambulatory Visit: Payer: 59 | Admitting: Cardiology

## 2018-09-30 ENCOUNTER — Encounter: Payer: Self-pay | Admitting: Cardiology

## 2018-09-30 VITALS — BP 140/64 | HR 59 | Ht 76.0 in | Wt 321.0 lb

## 2018-09-30 DIAGNOSIS — G4730 Sleep apnea, unspecified: Secondary | ICD-10-CM | POA: Diagnosis not present

## 2018-09-30 DIAGNOSIS — Z950 Presence of cardiac pacemaker: Secondary | ICD-10-CM | POA: Diagnosis not present

## 2018-09-30 DIAGNOSIS — I10 Essential (primary) hypertension: Secondary | ICD-10-CM

## 2018-09-30 DIAGNOSIS — I441 Atrioventricular block, second degree: Secondary | ICD-10-CM | POA: Diagnosis not present

## 2018-09-30 MED FILL — SHINGRIX 50 MCG SUS: 50 | 30 days supply | Qty: 1 | Fill #0

## 2018-09-30 NOTE — Progress Notes (Signed)
Cardiology Office Note:    Date:  09/30/2018   ID:  [name], DOB 11-18-1955, MRN [id number]  PCP:  Antony Contras, MD  Cardiologist:  Jenean Lindau, MD   Referring MD: Antony Contras, MD    ASSESSMENT:    1. Mobitz type 2 second degree atrioventricular block   2. Morbid obesity ([place])   3. Cardiac pacemaker in situ   4. Sleep apnea in adult   5. Essential hypertension    PLAN:    In order of problems listed above:  1. Primary prevention stressed with the patient.  Importance of compliance with diet and medication stressed and he vocalized understanding. 2. Blood pressure is stable.  Diet was discussed for dyslipidemia and obesity.  Risks of obesity explained.  He mentions to me that he had blood work done recently by his primary care physician and we will get a copy of it. 3. His permanent pacemaker is functioning satisfactorily.  I did my electrophysiology colleagues notes and updated myself about that information. 4. Patient will be seen in follow-up appointment in 6 months or earlier if the patient has any concerns    Medication Adjustments/Labs and Tests Ordered: Current medicines are reviewed at length with the patient today.  Concerns regarding medicines are outlined above.  No orders of the defined types were placed in this encounter.  No orders of the defined types were placed in this encounter.    No chief complaint on file.    History of Present Illness:    Juan Hanson is a 62 y.o. male.  Patient has past medical history of second-degree AV block post permanent pacemaker, essential hypertension obstructive sleep apnea.  He is a patient of Dr. Wynonia Lawman and is here to be established.  He denies any problems at this time and takes care of activities of daily living.  He is morbidly obese and leads a sedentary lifestyle.  He is an active gentleman but does not exercise on a regular basis.  At the time of my evaluation, the patient is alert awake oriented and  in no distress.  Past Medical History:  Diagnosis Date  . AV block, 2nd degree    /notes 12/18/2016  . Headache    "had them when I woke up before getting my CPAP; never had migraines" (12/18/2016)  . HTN (hypertension)   . OSA on CPAP   . Pneumonia ~ 2012  . Presence of permanent cardiac pacemaker   . Right bundle branch block    /notes 12/18/2016    Past Surgical History:  Procedure Laterality Date  . COLONOSCOPY  X 2  . INSERT / REPLACE / REMOVE PACEMAKER  12/18/2016  . PACEMAKER IMPLANT N/A 12/18/2016   Procedure: Pacemaker Implant;  Surgeon: Evans Lance, MD;  Location: [facility];  Service: Cardiovascular;  Laterality: N/A;    Current Medications: Current Meds  Medication Sig  . amLODipine (NORVASC) 10 MG tablet Take 10 mg by mouth daily.  . hydrochlorothiazide (HYDRODIURIL) 25 MG tablet Take 1 tablet by mouth daily.  Marland Kitchen losartan (COZAAR) 100 MG tablet Take 1 tablet by mouth daily.     Allergies:   Patient has no known allergies.   Social History   Socioeconomic History  . Marital status: Divorced    Spouse name: Not on file  . Number of children: 2  . Years of education: Not on file  . Highest education level: Not on file  Occupational History  . Not on  file  Social Needs  . Financial resource strain: Not on file  . Food insecurity:    Worry: Not on file    Inability: Not on file  . Transportation needs:    Medical: Not on file    Non-medical: Not on file  Tobacco Use  . Smoking status: Never Smoker  . Smokeless tobacco: Former Systems developer    Types: Snuff  Substance and Sexual Activity  . Alcohol use: Yes    Alcohol/week: 0.0 standard drinks    Comment: 12/18/2016 "<10 drinks/year"  . Drug use: No  . Sexual activity: Not Currently  Lifestyle  . Physical activity:    Days per week: Not on file    Minutes per session: Not on file  . Stress: Not on file  Relationships  . Social connections:    Talks on phone: Not on file    Gets together: Not on  file    Attends religious service: Not on file    Active member of club or organization: Not on file    Attends meetings of clubs or organizations: Not on file    Relationship status: Not on file  Other Topics Concern  . Not on file  Social History Narrative  . Not on file     Family History: The patient's family history includes Arthritis in his mother; Colon cancer in his father; Healthy in his brother and sister; Lung cancer in his father and mother.  ROS:   Please see the history of present illness.    All other systems reviewed and are negative.  EKGs/Labs/Other Studies Reviewed:    The following studies were reviewed today: I discussed my findings with the patient at extensive length and EKG reveals sinus rhythm and nonspecific ST-T changes.   Recent Labs: No results found for requested labs within last 8760 hours.  Recent Lipid Panel No results found for: CHOL, TRIG, HDL, CHOLHDL, VLDL, LDLCALC, LDLDIRECT  Physical Exam:    VS:  BP 140/64 (BP Location: Right Arm, Patient Position: Sitting, Cuff Size: Normal)   Pulse (!) 59   Ht 6\' 4"  (1.93 m)   Wt (!) 321 lb (145.6 kg)   SpO2 98%   BMI 39.07 kg/m     Wt Readings from Last 3 Encounters:  09/30/18 (!) 321 lb (145.6 kg)  07/11/18 (!) 318 lb (144.2 kg)  04/08/18 (!) 309 lb (140.2 kg)     GEN: Patient is in no acute distress HEENT: Normal NECK: No JVD; No carotid bruits LYMPHATICS: No lymphadenopathy CARDIAC: Hear sounds regular, 2/6 systolic murmur at the apex. RESPIRATORY:  Clear to auscultation without rales, wheezing or rhonchi  ABDOMEN: Soft, non-tender, non-distended MUSCULOSKELETAL:  No edema; No deformity  SKIN: Warm and dry NEUROLOGIC:  Alert and oriented x 3 PSYCHIATRIC:  Normal affect   Signed, Jenean Lindau, MD  09/30/2018 9:24 AM    [place]

## 2018-09-30 NOTE — Patient Instructions (Signed)
Medication Instructions:  Your physician recommends that you continue on your current medications as directed. Please refer to the Current Medication list given to you today.  If you need a refill on your cardiac medications before your next appointment, please call your pharmacy.   Lab work: None.  If you have labs (blood work) drawn today and your tests are completely normal, you will receive your results only by: . MyChart Message (if you have MyChart) OR . A paper copy in the mail If you have any lab test that is abnormal or we need to change your treatment, we will call you to review the results.  Testing/Procedures: None.   Follow-Up: At [facility], you and your health needs are our priority.  As part of our continuing mission to provide you with exceptional heart care, we have created designated Provider Care Teams.  These Care Teams include your primary Cardiologist (physician) and Advanced Practice Providers (APPs -  Physician Assistants and Nurse Practitioners) who all work together to provide you with the care you need, when you need it. You will need a follow up appointment in 6 months.  Please call our office 2 months in advance to schedule this appointment.  You may see No primary care provider on file. or another member of our [facility] Provider Team in [place]: Robert Krasowski, MD . Brian Munley, MD  Any Other Special Instructions Will Be Listed Below (If Applicable).     

## 2018-10-03 DIAGNOSIS — G4733 Obstructive sleep apnea (adult) (pediatric): Secondary | ICD-10-CM | POA: Diagnosis not present

## 2018-11-20 ENCOUNTER — Ambulatory Visit (INDEPENDENT_AMBULATORY_CARE_PROVIDER_SITE_OTHER): Payer: 59

## 2018-11-20 DIAGNOSIS — I441 Atrioventricular block, second degree: Secondary | ICD-10-CM

## 2018-11-21 ENCOUNTER — Encounter: Payer: Self-pay | Admitting: Cardiology

## 2018-11-21 NOTE — Progress Notes (Signed)
Remote pacemaker transmission.   

## 2018-11-23 LAB — CUP PACEART REMOTE DEVICE CHECK
Battery Remaining Longevity: 57 mo
Battery Voltage: 3.02 V
Brady Statistic AP VP Percent: 0.67 %
Brady Statistic AP VS Percent: 2.98 %
Brady Statistic AS VP Percent: 1.13 %
Brady Statistic RA Percent Paced: 3.36 %
Brady Statistic RV Percent Paced: 1.8 %
Implantable Lead Implant Date: 20180220
Implantable Lead Location: 753859
Implantable Lead Location: 753860
Implantable Lead Model: 3830
Implantable Lead Model: 5076
Implantable Pulse Generator Implant Date: 20180220
Lead Channel Impedance Value: 304 Ohm
Lead Channel Impedance Value: 323 Ohm
Lead Channel Impedance Value: 456 Ohm
Lead Channel Impedance Value: 475 Ohm
Lead Channel Pacing Threshold Amplitude: 0.5 V
Lead Channel Pacing Threshold Pulse Width: 0.4 ms
Lead Channel Sensing Intrinsic Amplitude: 3.875 mV
Lead Channel Sensing Intrinsic Amplitude: 5.75 mV
Lead Channel Setting Pacing Amplitude: 1.5 V
Lead Channel Setting Pacing Amplitude: 6 V
Lead Channel Setting Pacing Pulse Width: 1 ms
Lead Channel Setting Sensing Sensitivity: 4 mV
MDC IDC LEAD IMPLANT DT: 20180220
MDC IDC MSMT LEADCHNL RA SENSING INTR AMPL: 3.875 mV
MDC IDC MSMT LEADCHNL RV PACING THRESHOLD AMPLITUDE: 2.5 V
MDC IDC MSMT LEADCHNL RV PACING THRESHOLD PULSEWIDTH: 0.4 ms
MDC IDC MSMT LEADCHNL RV SENSING INTR AMPL: 5.75 mV
MDC IDC SESS DTM: 20200123040353
MDC IDC STAT BRADY AS VS PERCENT: 95.23 %

## 2018-12-16 MED FILL — SHINGRIX 50 MCG SUS: 50 | 30 days supply | Qty: 1 | Fill #1

## 2019-01-01 ENCOUNTER — Ambulatory Visit: Payer: 59 | Admitting: Internal Medicine

## 2019-01-01 ENCOUNTER — Encounter: Payer: Self-pay | Admitting: Internal Medicine

## 2019-01-01 VITALS — BP 132/78 | HR 69 | Ht 76.0 in | Wt 320.4 lb

## 2019-01-01 DIAGNOSIS — I10 Essential (primary) hypertension: Secondary | ICD-10-CM | POA: Diagnosis not present

## 2019-01-01 DIAGNOSIS — I441 Atrioventricular block, second degree: Secondary | ICD-10-CM | POA: Diagnosis not present

## 2019-01-01 DIAGNOSIS — Z950 Presence of cardiac pacemaker: Secondary | ICD-10-CM | POA: Diagnosis not present

## 2019-01-01 LAB — CUP PACEART INCLINIC DEVICE CHECK
Battery Remaining Longevity: 55 mo
Battery Voltage: 3.02 V
Brady Statistic AP VP Percent: 0.5 %
Brady Statistic AP VS Percent: 3.6 %
Brady Statistic AS VS Percent: 94.37 %
Brady Statistic RA Percent Paced: 3.92 %
Brady Statistic RV Percent Paced: 2.03 %
Date Time Interrogation Session: 20200305192034
Implantable Lead Implant Date: 20180220
Implantable Lead Implant Date: 20180220
Implantable Lead Location: 753859
Implantable Lead Location: 753860
Implantable Lead Model: 3830
Implantable Lead Model: 5076
Lead Channel Impedance Value: 304 Ohm
Lead Channel Impedance Value: 323 Ohm
Lead Channel Impedance Value: 437 Ohm
Lead Channel Impedance Value: 456 Ohm
Lead Channel Pacing Threshold Amplitude: 0.5 V
Lead Channel Pacing Threshold Amplitude: 4.5 V
Lead Channel Pacing Threshold Pulse Width: 0.4 ms
Lead Channel Pacing Threshold Pulse Width: 1 ms
Lead Channel Sensing Intrinsic Amplitude: 3.25 mV
Lead Channel Sensing Intrinsic Amplitude: 6.75 mV
Lead Channel Setting Pacing Amplitude: 1.5 V
Lead Channel Setting Pacing Pulse Width: 1 ms
Lead Channel Setting Sensing Sensitivity: 4 mV
MDC IDC PG IMPLANT DT: 20180220
MDC IDC SET LEADCHNL RV PACING AMPLITUDE: 6 V
MDC IDC STAT BRADY AS VP PERCENT: 1.53 %

## 2019-01-01 NOTE — Patient Instructions (Addendum)
Medication Instructions:  Your physician recommends that you continue on your current medications as directed. Please refer to the Current Medication list given to you today.  Labwork: None ordered.  Testing/Procedures: None ordered.  Follow-Up: Your physician wants you to follow-up in: 6 months with Dr. Lovena Le.   You will receive a reminder letter in the mail two months in advance. If you don't receive a letter, please call our office to schedule the follow-up appointment.  Remote monitoring is used to monitor your Pacemaker from home. This monitoring reduces the number of office visits required to check your device to one time per year. It allows [place] to keep an eye on the functioning of your device to ensure it is working properly. You are scheduled for a device check from home on 02/19/2019. You may send your transmission at any time that day. If you have a wireless device, the transmission will be sent automatically. After your physician reviews your transmission, you will receive a postcard with your next transmission date.  Any Other Special Instructions Will Be Listed Below (If Applicable).  If you need a refill on your cardiac medications before your next appointment, please call your pharmacy.

## 2019-01-01 NOTE — Progress Notes (Signed)
HPI Juan Hanson returns today for followup of [name]. He has a h/o transient mobitz 2, second degree AV block with syncope. In the interim, he has done well. He has felt well but he admits to being sedentary. No anginal symptoms. When we saw him last we reprogrammed his device to maximize battery longevity.  No Known Allergies   Current Outpatient Medications  Medication Sig Dispense Refill  . amLODipine (NORVASC) 10 MG tablet Take 10 mg by mouth daily.    . hydrochlorothiazide (HYDRODIURIL) 25 MG tablet Take 1 tablet by mouth daily.    Marland Kitchen losartan (COZAAR) 100 MG tablet Take 1 tablet by mouth daily.     No current facility-administered medications for this visit.      Past Medical History:  Diagnosis Date  . AV block, 2nd degree    /notes 12/18/2016  . Headache    "had them when I woke up before getting my CPAP; never had migraines" (12/18/2016)  . HTN (hypertension)   . OSA on CPAP   . Pneumonia ~ 2012  . Presence of permanent cardiac pacemaker   . Right bundle branch block    /notes 12/18/2016    ROS:   All systems reviewed and negative except as noted in the HPI.   Past Surgical History:  Procedure Laterality Date  . COLONOSCOPY  X 2  . INSERT / REPLACE / REMOVE PACEMAKER  12/18/2016  . PACEMAKER IMPLANT N/A 12/18/2016   Procedure: Pacemaker Implant;  Surgeon: Evans Lance, MD;  Location: [facility];  Service: Cardiovascular;  Laterality: N/A;     Family History  Problem Relation Age of Onset  . Lung cancer Mother   . Arthritis Mother   . Colon cancer Father   . Lung cancer Father   . Healthy Brother        x3  . Healthy Sister      Social History   Socioeconomic History  . Marital status: Divorced    Spouse name: Not on file  . Number of children: 2  . Years of education: Not on file  . Highest education level: Not on file  Occupational History  . Not on file  Social Needs  . Financial resource strain: Not on file  . Food  insecurity:    Worry: Not on file    Inability: Not on file  . Transportation needs:    Medical: Not on file    Non-medical: Not on file  Tobacco Use  . Smoking status: Never Smoker  . Smokeless tobacco: Former Systems developer    Types: Snuff  Substance and Sexual Activity  . Alcohol use: Yes    Alcohol/week: 0.0 standard drinks    Comment: 12/18/2016 "<10 drinks/year"  . Drug use: No  . Sexual activity: Not Currently  Lifestyle  . Physical activity:    Days per week: Not on file    Minutes per session: Not on file  . Stress: Not on file  Relationships  . Social connections:    Talks on phone: Not on file    Gets together: Not on file    Attends religious service: Not on file    Active member of club or organization: Not on file    Attends meetings of clubs or organizations: Not on file    Relationship status: Not on file  . Intimate partner violence:    Fear of current or ex partner: Not on file    Emotionally abused:  Not on file    Physically abused: Not on file    Forced sexual activity: Not on file  Other Topics Concern  . Not on file  Social History Narrative  . Not on file     BP 132/78   Pulse 69   Ht 6\' 4"  (1.93 m)   Wt (!) 320 lb 6.4 oz (145.3 kg)   SpO2 95%   BMI 39.00 kg/m   Physical Exam:  Well appearing NAD HEENT: Unremarkable Neck:  No JVD, no thyromegally Lymphatics:  No adenopathy Back:  No CVA tenderness Lungs:  Clear HEART:  Regular rate rhythm, no murmurs, no rubs, no clicks Abd:  soft, positive bowel sounds, no organomegally, no rebound, no guarding Ext:  2 plus pulses, no edema, no cyanosis, no clubbing Skin:  No rashes no nodules Neuro:  CN II through XII intact, motor grossly intact   DEVICE  Normal device function.  See PaceArt for details.   Assess/Plan: 1. Heart block - he has had no symptoms, s/p PPM insertion.  2. PPM - his medtronic device is working normally except for the elevated pacing threshold. He is only pacing 2% of the  time. He will undergo watchful waiting for now. If his threshold worsens or he starts pacing more often in the ventricle we will be required to upgrade his device. 3. Obesity - I strongly encouraged the patient to lose weight.   Mikle Bosworth.D.

## 2019-01-02 DIAGNOSIS — G4733 Obstructive sleep apnea (adult) (pediatric): Secondary | ICD-10-CM | POA: Diagnosis not present

## 2019-01-05 DIAGNOSIS — Z0000 Encounter for general adult medical examination without abnormal findings: Secondary | ICD-10-CM | POA: Diagnosis not present

## 2019-01-05 DIAGNOSIS — R609 Edema, unspecified: Secondary | ICD-10-CM | POA: Diagnosis not present

## 2019-01-05 DIAGNOSIS — G4733 Obstructive sleep apnea (adult) (pediatric): Secondary | ICD-10-CM | POA: Diagnosis not present

## 2019-01-05 DIAGNOSIS — I10 Essential (primary) hypertension: Secondary | ICD-10-CM | POA: Diagnosis not present

## 2019-02-19 ENCOUNTER — Other Ambulatory Visit: Payer: Self-pay

## 2019-02-19 ENCOUNTER — Ambulatory Visit (INDEPENDENT_AMBULATORY_CARE_PROVIDER_SITE_OTHER): Payer: 59 | Admitting: *Deleted

## 2019-02-19 DIAGNOSIS — I441 Atrioventricular block, second degree: Secondary | ICD-10-CM | POA: Diagnosis not present

## 2019-02-19 LAB — CUP PACEART REMOTE DEVICE CHECK
Battery Remaining Longevity: 57 mo
Battery Voltage: 3.01 V
Brady Statistic AP VP Percent: 0.19 %
Brady Statistic AP VS Percent: 5.59 %
Brady Statistic AS VP Percent: 2.28 %
Brady Statistic AS VS Percent: 91.94 %
Brady Statistic RA Percent Paced: 5.71 %
Brady Statistic RV Percent Paced: 2.47 %
Date Time Interrogation Session: 20200423065151
Implantable Lead Implant Date: 20180220
Implantable Lead Implant Date: 20180220
Implantable Lead Location: 753859
Implantable Lead Location: 753860
Implantable Lead Model: 3830
Implantable Lead Model: 5076
Implantable Pulse Generator Implant Date: 20180220
Lead Channel Impedance Value: 304 Ohm
Lead Channel Impedance Value: 323 Ohm
Lead Channel Impedance Value: 456 Ohm
Lead Channel Impedance Value: 475 Ohm
Lead Channel Pacing Threshold Amplitude: 0.5 V
Lead Channel Pacing Threshold Amplitude: 2.5 V
Lead Channel Pacing Threshold Pulse Width: 0.4 ms
Lead Channel Pacing Threshold Pulse Width: 0.4 ms
Lead Channel Sensing Intrinsic Amplitude: 3.875 mV
Lead Channel Sensing Intrinsic Amplitude: 7.125 mV
Lead Channel Setting Pacing Amplitude: 1.5 V
Lead Channel Setting Pacing Amplitude: 6 V
Lead Channel Setting Pacing Pulse Width: 1 ms
Lead Channel Setting Sensing Sensitivity: 4 mV

## 2019-02-23 ENCOUNTER — Telehealth: Payer: Self-pay | Admitting: Cardiology

## 2019-02-23 NOTE — Telephone Encounter (Signed)
Virtual Visit Pre-Appointment Phone Call  "(Name), I am calling you today to discuss your upcoming appointment. We are currently trying to limit exposure to the virus that causes COVID-19 by seeing patients at home rather than in the office."  1. "What is the BEST phone number to call the day of the visit?" - include this in appointment notes  2. Do you have or have access to (through a family member/friend) a smartphone with video capability that we can use for your visit?" a. If yes - list this number in appt notes as cell (if different from BEST phone #) and list the appointment type as a VIDEO visit in appointment notes b. If no - list the appointment type as a PHONE visit in appointment notes  3. Confirm consent - "In the setting of the current Covid19 crisis, you are scheduled for a (phone or video) visit with your provider on (date) at (time).  Just as we do with many in-office visits, in order for you to participate in this visit, we must obtain consent.  If you'd like, I can send this to your mychart (if signed up) or email for you to review.  Otherwise, I can obtain your verbal consent now.  All virtual visits are billed to your insurance company just like a normal visit would be.  By agreeing to a virtual visit, we'd like you to understand that the technology does not allow for your provider to perform an examination, and thus may limit your provider's ability to fully assess your condition. If your provider identifies any concerns that need to be evaluated in person, we will make arrangements to do so.  Finally, though the technology is pretty good, we cannot assure that it will always work on either your or our end, and in the setting of a video visit, we may have to convert it to a phone-only visit.  In either situation, we cannot ensure that we have a secure connection.  Are you willing to proceed?" STAFF: Did the patient verbally acknowledge consent to telehealth visit? Document  YES/NO here: Yes  4. Advise patient to be prepared - "Two hours prior to your appointment, go ahead and check your blood pressure, pulse, oxygen saturation, and your weight (if you have the equipment to check those) and write them all down. When your visit starts, your provider will ask you for this information. If you have an Apple Watch or Kardia device, please plan to have heart rate information ready on the day of your appointment. Please have a pen and paper handy nearby the day of the visit as well."  5. Give patient instructions for MyChart download to smartphone OR [email or web address] as below if video visit (depending on what platform provider is using)  6. Inform patient they will receive a phone call 15 minutes prior to their appointment time (may be from unknown caller ID) so they should be prepared to answer    TELEPHONE CALL NOTE  Juan Hanson has been deemed a candidate for a follow-up tele-health visit to limit community exposure during the Covid-19 pandemic. I spoke with the patient via phone to ensure availability of phone/video source, confirm preferred email & phone number, and discuss instructions and expectations.  I reminded Juan Hanson to be prepared with any vital sign and/or heart rhythm information that could potentially be obtained via home monitoring, at the time of his visit. I reminded Juan Hanson to expect a phone call prior to  his visit.  Calla Kicks 02/23/2019 4:36 PM   FULL LENGTH CONSENT FOR TELE-HEALTH VISIT   I hereby voluntarily request, consent and authorize [facility] and its employed or contracted physicians, physician assistants, nurse practitioners or other licensed health care professionals (the Practitioner), to provide me with telemedicine health care services (the Services") as deemed necessary by the treating Practitioner. I acknowledge and consent to receive the Services by the Practitioner via telemedicine. I understand that the  telemedicine visit will involve communicating with the Practitioner through live audiovisual communication technology and the disclosure of certain medical information by electronic transmission. I acknowledge that I have been given the opportunity to request an in-person assessment or other available alternative prior to the telemedicine visit and am voluntarily participating in the telemedicine visit.  I understand that I have the right to withhold or withdraw my consent to the use of telemedicine in the course of my care at any time, without affecting my right to future care or treatment, and that the Practitioner or I may terminate the telemedicine visit at any time. I understand that I have the right to inspect all information obtained and/or recorded in the course of the telemedicine visit and may receive copies of available information for a reasonable fee.  I understand that some of the potential risks of receiving the Services via telemedicine include:   Delay or interruption in medical evaluation due to technological equipment failure or disruption;  Information transmitted may not be sufficient (e.g. poor resolution of images) to allow for appropriate medical decision making by the Practitioner; and/or   In rare instances, security protocols could fail, causing a breach of personal health information.  Furthermore, I acknowledge that it is my responsibility to provide information about my medical history, conditions and care that is complete and accurate to the best of my ability. I acknowledge that Practitioner's advice, recommendations, and/or decision may be based on factors not within their control, such as incomplete or inaccurate data provided by me or distortions of diagnostic images or specimens that may result from electronic transmissions. I understand that the practice of medicine is not an exact science and that Practitioner makes no warranties or guarantees regarding treatment  outcomes. I acknowledge that I will receive a copy of this consent concurrently upon execution via email to the email address I last provided but may also request a printed copy by calling the office of [place].    I understand that my insurance will be billed for this visit.   I have read or had this consent read to me.  I understand the contents of this consent, which adequately explains the benefits and risks of the Services being provided via telemedicine.   I have been provided ample opportunity to ask questions regarding this consent and the Services and have had my questions answered to my satisfaction.  I give my informed consent for the services to be provided through the use of telemedicine in my medical care  By participating in this telemedicine visit I agree to the above.

## 2019-02-25 ENCOUNTER — Other Ambulatory Visit: Payer: Self-pay

## 2019-02-25 ENCOUNTER — Telehealth (INDEPENDENT_AMBULATORY_CARE_PROVIDER_SITE_OTHER): Payer: 59 | Admitting: Cardiology

## 2019-02-25 ENCOUNTER — Encounter: Payer: Self-pay | Admitting: Cardiology

## 2019-02-25 VITALS — BP 119/76 | HR 68 | Ht 76.0 in | Wt 307.0 lb

## 2019-02-25 DIAGNOSIS — I10 Essential (primary) hypertension: Secondary | ICD-10-CM

## 2019-02-25 DIAGNOSIS — I441 Atrioventricular block, second degree: Secondary | ICD-10-CM | POA: Diagnosis not present

## 2019-02-25 DIAGNOSIS — Z950 Presence of cardiac pacemaker: Secondary | ICD-10-CM

## 2019-02-25 DIAGNOSIS — G4730 Sleep apnea, unspecified: Secondary | ICD-10-CM

## 2019-02-25 NOTE — Patient Instructions (Signed)

## 2019-02-25 NOTE — Progress Notes (Signed)
Virtual Visit via Video Note   This visit type was conducted due to national recommendations for restrictions regarding the COVID-19 Pandemic (e.g. social distancing) in an effort to limit this patient's exposure and mitigate transmission in our community.  Due to his co-morbid illnesses, this patient is at least at moderate risk for complications without adequate follow up.  This format is felt to be most appropriate for this patient at this time.  All issues noted in this document were discussed and addressed.  A limited physical exam was performed with this format.  Please refer to the patient's chart for his consent to telehealth for [facility].   Evaluation Performed:  Follow-up visit  Date:  02/25/2019   ID:  Juan Hanson, Juan Hanson 09-06-1956, MRN [id number]  Patient Location: Home Provider Location: Home  PCP:  Antony Contras, MD  Cardiologist:  No primary care provider on file.  Electrophysiologist:  None   Chief Complaint: Permanent pacemaker follow-up  History of Present Illness:    Juan Hanson is a 63 y.o. male with past medical history of essential hypertension, permanent pacemaker.  He leads a sedentary lifestyle and is morbidly obese.  He denies any chest pain orthopnea or PND.  At the time of my evaluation, the patient is alert awake oriented and in no distress.  The patient does not have symptoms concerning for COVID-19 infection (fever, chills, cough, or new shortness of breath).    Past Medical History:  Diagnosis Date  . AV block, 2nd degree    /notes 12/18/2016  . Headache    "had them when I woke up before getting my CPAP; never had migraines" (12/18/2016)  . HTN (hypertension)   . OSA on CPAP   . Pneumonia ~ 2012  . Presence of permanent cardiac pacemaker   . Right bundle branch block    /notes 12/18/2016   Past Surgical History:  Procedure Laterality Date  . COLONOSCOPY  X 2  . INSERT / REPLACE / REMOVE PACEMAKER  12/18/2016  . PACEMAKER IMPLANT  N/A 12/18/2016   Procedure: Pacemaker Implant;  Surgeon: Evans Lance, MD;  Location: [place] [facility];  Service: Cardiovascular;  Laterality: N/A;     Current Meds  Medication Sig  . amLODipine (NORVASC) 10 MG tablet Take 10 mg by mouth daily.  . hydrochlorothiazide (HYDRODIURIL) 25 MG tablet Take 1 tablet by mouth daily.  Marland Kitchen losartan (COZAAR) 100 MG tablet Take 1 tablet by mouth daily.     Allergies:   Patient has no known allergies.   Social History   Tobacco Use  . Smoking status: Never Smoker  . Smokeless tobacco: Former Systems developer    Types: Snuff  Substance Use Topics  . Alcohol use: Yes    Alcohol/week: 0.0 standard drinks    Comment: 12/18/2016 "<10 drinks/year"  . Drug use: No     Family Hx: The patient's family history includes Arthritis in his mother; Colon cancer in his father; Healthy in his brother and sister; Lung cancer in his father and mother.  ROS:   Please see the history of present illness.    No chest pain orthopnea or PND All other systems reviewed and are negative.   Prior CV studies:   The following studies were reviewed today:  Records of our electrophysiology colleague were reviewed at length  Labs/Other Tests and Data Reviewed:    EKG:  No ECG reviewed.  Recent Labs: No results found for requested labs within last 8760 hours.  Recent Lipid Panel No results found for: CHOL, TRIG, HDL, CHOLHDL, LDLCALC, LDLDIRECT  Wt Readings from Last 3 Encounters:  02/25/19 (!) 307 lb (139.3 kg)  01/01/19 (!) 320 lb 6.4 oz (145.3 kg)  09/30/18 (!) 321 lb (145.6 kg)     Objective:    Vital Signs:  BP 119/76 (BP Location: Left Arm, Patient Position: Sitting, Cuff Size: Normal)   Pulse 68   Ht 6\' 4"  (1.93 m)   Wt (!) 307 lb (139.3 kg)   BMI 37.37 kg/m    VITAL SIGNS:  reviewed  ASSESSMENT & PLAN:    1. Essential hypertension: Blood pressure stable.  Patient will continue current medications.  His primary care physician has done blood work  recently and has kept him posted about it. 2. Permanent pacemaker: His pacemaker numbers appear fine and he is under the monitor of her electrophysiology colleagues 3. Morbid obesity: Diet was discussed for obesity.  Risks of obesity explained.  Importance of compliance with diet medications and exercise stressed.  I told him and cautioned him against a sedentary lifestyle and he tells me that he will try to do better. 4.   COVID-19 Education: The signs and symptoms of COVID-19 were discussed with the patient and how to seek care for testing (follow up with PCP or arrange E-visit).  The importance of social distancing was discussed today.  Time:   Today, I have spent 25 minutes with the patient with telehealth technology discussing the above problems.     Medication Adjustments/Labs and Tests Ordered: Current medicines are reviewed at length with the patient today.  Concerns regarding medicines are outlined above.   Tests Ordered: No orders of the defined types were placed in this encounter.   Medication Changes: No orders of the defined types were placed in this encounter.   Disposition:  Follow up in 6 month(s)  Signed, Jenean Lindau, MD  02/25/2019 9:38 AM    [place]

## 2019-02-27 ENCOUNTER — Encounter: Payer: Self-pay | Admitting: Cardiology

## 2019-02-27 NOTE — Progress Notes (Signed)
Remote pacemaker transmission.   

## 2019-05-21 ENCOUNTER — Ambulatory Visit (INDEPENDENT_AMBULATORY_CARE_PROVIDER_SITE_OTHER): Payer: 59 | Admitting: *Deleted

## 2019-05-21 DIAGNOSIS — I441 Atrioventricular block, second degree: Secondary | ICD-10-CM

## 2019-05-22 LAB — CUP PACEART REMOTE DEVICE CHECK
Battery Remaining Longevity: 56 mo
Battery Voltage: 3.02 V
Brady Statistic AP VP Percent: 0.13 %
Brady Statistic AP VS Percent: 5.12 %
Brady Statistic AS VP Percent: 0.25 %
Brady Statistic AS VS Percent: 94.49 %
Brady Statistic RA Percent Paced: 5.18 %
Brady Statistic RV Percent Paced: 0.38 %
Date Time Interrogation Session: 20200723044532
Implantable Lead Implant Date: 20180220
Implantable Lead Implant Date: 20180220
Implantable Lead Location: 753859
Implantable Lead Location: 753860
Implantable Lead Model: 3830
Implantable Lead Model: 5076
Implantable Pulse Generator Implant Date: 20180220
Lead Channel Impedance Value: 304 Ohm
Lead Channel Impedance Value: 304 Ohm
Lead Channel Impedance Value: 418 Ohm
Lead Channel Impedance Value: 475 Ohm
Lead Channel Pacing Threshold Amplitude: 0.5 V
Lead Channel Pacing Threshold Amplitude: 2.5 V
Lead Channel Pacing Threshold Pulse Width: 0.4 ms
Lead Channel Pacing Threshold Pulse Width: 0.4 ms
Lead Channel Sensing Intrinsic Amplitude: 3.375 mV
Lead Channel Sensing Intrinsic Amplitude: 3.375 mV
Lead Channel Sensing Intrinsic Amplitude: 6.125 mV
Lead Channel Sensing Intrinsic Amplitude: 6.125 mV
Lead Channel Setting Pacing Amplitude: 1.5 V
Lead Channel Setting Pacing Amplitude: 6 V
Lead Channel Setting Pacing Pulse Width: 1 ms
Lead Channel Setting Sensing Sensitivity: 4 mV

## 2019-06-02 NOTE — Progress Notes (Signed)
Remote pacemaker transmission.   

## 2019-08-21 ENCOUNTER — Ambulatory Visit (INDEPENDENT_AMBULATORY_CARE_PROVIDER_SITE_OTHER): Payer: 59 | Admitting: *Deleted

## 2019-08-21 DIAGNOSIS — I441 Atrioventricular block, second degree: Secondary | ICD-10-CM

## 2019-08-23 LAB — CUP PACEART REMOTE DEVICE CHECK
Battery Remaining Longevity: 59 mo
Battery Voltage: 3.02 V
Brady Statistic AP VP Percent: 0.07 %
Brady Statistic AP VS Percent: 4.66 %
Brady Statistic AS VP Percent: 0.49 %
Brady Statistic AS VS Percent: 94.78 %
Brady Statistic RA Percent Paced: 4.69 %
Brady Statistic RV Percent Paced: 0.56 %
Date Time Interrogation Session: 20201023171314
Implantable Lead Implant Date: 20180220
Implantable Lead Implant Date: 20180220
Implantable Lead Location: 753859
Implantable Lead Location: 753860
Implantable Lead Model: 3830
Implantable Lead Model: 5076
Implantable Pulse Generator Implant Date: 20180220
Lead Channel Impedance Value: 323 Ohm
Lead Channel Impedance Value: 342 Ohm
Lead Channel Impedance Value: 437 Ohm
Lead Channel Impedance Value: 532 Ohm
Lead Channel Pacing Threshold Amplitude: 0.625 V
Lead Channel Pacing Threshold Amplitude: 2.5 V
Lead Channel Pacing Threshold Pulse Width: 0.4 ms
Lead Channel Pacing Threshold Pulse Width: 0.4 ms
Lead Channel Sensing Intrinsic Amplitude: 3.5 mV
Lead Channel Sensing Intrinsic Amplitude: 3.5 mV
Lead Channel Sensing Intrinsic Amplitude: 6.875 mV
Lead Channel Sensing Intrinsic Amplitude: 6.875 mV
Lead Channel Setting Pacing Amplitude: 1.5 V
Lead Channel Setting Pacing Amplitude: 6 V
Lead Channel Setting Pacing Pulse Width: 1 ms
Lead Channel Setting Sensing Sensitivity: 4 mV

## 2019-08-28 ENCOUNTER — Ambulatory Visit: Payer: 59 | Admitting: Internal Medicine

## 2019-08-28 ENCOUNTER — Encounter: Payer: Self-pay | Admitting: Internal Medicine

## 2019-08-28 ENCOUNTER — Other Ambulatory Visit: Payer: Self-pay

## 2019-08-28 VITALS — BP 126/74 | HR 67 | Ht 76.0 in | Wt 340.6 lb

## 2019-08-28 DIAGNOSIS — I10 Essential (primary) hypertension: Secondary | ICD-10-CM

## 2019-08-28 DIAGNOSIS — Z950 Presence of cardiac pacemaker: Secondary | ICD-10-CM

## 2019-08-28 DIAGNOSIS — I441 Atrioventricular block, second degree: Secondary | ICD-10-CM

## 2019-08-28 NOTE — Progress Notes (Signed)
HPI Juan Hanson returns today for followup. He is a pleasant 63 yo man with a h/o heart block and sinus node dysfunction and morbid obesity. He admits to dietary indiscretion and has gained 20 lbs in the last 7 months. He has not been active. No syncope.  No Known Allergies   Current Outpatient Medications  Medication Sig Dispense Refill  . amLODipine (NORVASC) 10 MG tablet Take 10 mg by mouth daily.    . hydrochlorothiazide (HYDRODIURIL) 25 MG tablet Take 1 tablet by mouth daily.    Marland Kitchen losartan (COZAAR) 100 MG tablet Take 1 tablet by mouth daily.     No current facility-administered medications for this visit.      Past Medical History:  Diagnosis Date  . AV block, 2nd degree    /notes 12/18/2016  . Headache    "had them when I woke up before getting my CPAP; never had migraines" (12/18/2016)  . HTN (hypertension)   . OSA on CPAP   . Pneumonia ~ 2012  . Presence of permanent cardiac pacemaker   . Right bundle branch block    /notes 12/18/2016    ROS:   All systems reviewed and negative except as noted in the HPI.   Past Surgical History:  Procedure Laterality Date  . COLONOSCOPY  X 2  . INSERT / REPLACE / REMOVE PACEMAKER  12/18/2016  . PACEMAKER IMPLANT N/A 12/18/2016   Procedure: Pacemaker Implant;  Surgeon: Evans Lance, MD;  Location: [place] [facility];  Service: Cardiovascular;  Laterality: N/A;     Family History  Problem Relation Age of Onset  . Lung cancer Mother   . Arthritis Mother   . Colon cancer Father   . Lung cancer Father   . Healthy Brother        x3  . Healthy Sister      Social History   Socioeconomic History  . Marital status: Divorced    Spouse name: Not on file  . Number of children: 2  . Years of education: Not on file  . Highest education level: Not on file  Occupational History  . Not on file  Social Needs  . Financial resource strain: Not on file  . Food insecurity    Worry: Not on file    Inability: Not on file  .  Transportation needs    Medical: Not on file    Non-medical: Not on file  Tobacco Use  . Smoking status: Never Smoker  . Smokeless tobacco: Former Systems developer    Types: Snuff  Substance and Sexual Activity  . Alcohol use: Yes    Alcohol/week: 0.0 standard drinks    Comment: 12/18/2016 "<10 drinks/year"  . Drug use: No  . Sexual activity: Not Currently  Lifestyle  . Physical activity    Days per week: Not on file    Minutes per session: Not on file  . Stress: Not on file  Relationships  . Social Herbalist on phone: Not on file    Gets together: Not on file    Attends religious service: Not on file    Active member of club or organization: Not on file    Attends meetings of clubs or organizations: Not on file    Relationship status: Not on file  . Intimate partner violence    Fear of current or ex partner: Not on file    Emotionally abused: Not on file    Physically abused:  Not on file    Forced sexual activity: Not on file  Other Topics Concern  . Not on file  Social History Narrative  . Not on file     BP 126/74   Pulse 67   Ht 6\' 4"  (1.93 m)   Wt (!) 340 lb 9.6 oz (154.5 kg)   SpO2 93%   BMI 41.46 kg/m   Physical Exam:  Well appearing NAD HEENT: Unremarkable Neck:  No JVD, no thyromegally Lymphatics:  No adenopathy Back:  No CVA tenderness Lungs:  Clear with no wheezes HEART:  Regular rate rhythm, no murmurs, no rubs, no clicks Abd:  soft, positive bowel sounds, no organomegally, no rebound, no guarding Ext:  2 plus pulses, no edema, no cyanosis, no clubbing Skin:  No rashes no nodules Neuro:  CN II through XII intact, motor grossly intact  EKG - nsr  DEVICE  Normal device function.  See PaceArt for details.   Assess/Plan: 1. Heart block - he is conducting today and his device is programmed to minimize RV pacing.  2. PPM - his RV threshold remains elevated. We will follow. 3.Obesity - he has gained 20 lbs in 7 months. I encouraged him to lose  weight. 4. HTN - his bp is fairly well controlled. No change in meds.   Juan Hanson.D.

## 2019-08-28 NOTE — Patient Instructions (Addendum)
Medication Instructions:  Your physician recommends that you continue on your current medications as directed. Please refer to the Current Medication list given to you today.  Labwork: None ordered.  Testing/Procedures: None ordered.  Follow-Up: Your physician wants you to follow-up in: 6 months with Dr. Lovena Le.   You will receive a reminder letter in the mail two months in advance. If you don't receive a letter, please call our office to schedule the follow-up appointment.  Remote monitoring is used to monitor your Pacemaker from home. This monitoring reduces the number of office visits required to check your device to one time per year. It allows [place] to keep an eye on the functioning of your device to ensure it is working properly. You are scheduled for a device check from home on 11/20/2019. You may send your transmission at any time that day. If you have a wireless device, the transmission will be sent automatically. After your physician reviews your transmission, you will receive a postcard with your next transmission date.  Any Other Special Instructions Will Be Listed Below (If Applicable).  If you need a refill on your cardiac medications before your next appointment, please call your pharmacy.

## 2019-08-31 NOTE — Progress Notes (Signed)
Remote pacemaker transmission.   

## 2019-09-14 ENCOUNTER — Telehealth: Payer: 59 | Admitting: Cardiology

## 2019-10-14 ENCOUNTER — Encounter: Payer: Self-pay | Admitting: Cardiology

## 2019-10-14 ENCOUNTER — Telehealth (INDEPENDENT_AMBULATORY_CARE_PROVIDER_SITE_OTHER): Payer: 59 | Admitting: Cardiology

## 2019-10-14 VITALS — BP 124/77 | HR 69 | Ht 76.0 in | Wt 324.0 lb

## 2019-10-14 DIAGNOSIS — E782 Mixed hyperlipidemia: Secondary | ICD-10-CM

## 2019-10-14 DIAGNOSIS — Z950 Presence of cardiac pacemaker: Secondary | ICD-10-CM

## 2019-10-14 DIAGNOSIS — G4730 Sleep apnea, unspecified: Secondary | ICD-10-CM

## 2019-10-14 DIAGNOSIS — I10 Essential (primary) hypertension: Secondary | ICD-10-CM | POA: Diagnosis not present

## 2019-10-14 DIAGNOSIS — I441 Atrioventricular block, second degree: Secondary | ICD-10-CM

## 2019-10-14 DIAGNOSIS — Z6839 Body mass index (BMI) 39.0-39.9, adult: Secondary | ICD-10-CM

## 2019-10-14 NOTE — Progress Notes (Signed)
Virtual Visit via Video Note   This visit type was conducted due to national recommendations for restrictions regarding the COVID-19 Pandemic (e.g. social distancing) in an effort to limit this patient's exposure and mitigate transmission in our community.  Due to his co-morbid illnesses, this patient is at least at moderate risk for complications without adequate follow up.  This format is felt to be most appropriate for this patient at this time.  All issues noted in this document were discussed and addressed.  A limited physical exam was performed with this format.  Please refer to the patient's chart for his consent to telehealth for [facility].   Date:  10/14/2019   ID:  Juan Hanson, DOB [birth year], MRN [id number]  Patient Location: Home Provider Location: Office  PCP:  Antony Contras, MD  Cardiologist:  No primary care provider on file.  Electrophysiologist:  None   Evaluation Performed:  Follow-Up Visit  Chief Complaint: Permanent pacemaker follow-up  History of Present Illness:    Juan Hanson is a 63 y.o. male with past medical history of essential hypertension, dyslipidemia and obesity.  He has permanent pacemaker.  He denies any problems at this time and takes care of activities of daily living.  No chest pain orthopnea or PND.  At the time of my evaluation, the patient is alert awake oriented and in no distress.  He is living a sedentary lifestyle and tells me that he has gained about 25 pounds in the past 6 to 7 months.  The patient does not have symptoms concerning for COVID-19 infection (fever, chills, cough, or new shortness of breath).    Past Medical History:  Diagnosis Date  . AV block, 2nd degree    /notes 12/18/2016  . Headache    "had them when I woke up before getting my CPAP; never had migraines" (12/18/2016)  . HTN (hypertension)   . OSA on CPAP   . Pneumonia ~ 2012  . Presence of permanent cardiac pacemaker   . Right bundle branch block    /notes 12/18/2016   Past Surgical History:  Procedure Laterality Date  . COLONOSCOPY  X 2  . INSERT / REPLACE / REMOVE PACEMAKER  12/18/2016  . PACEMAKER IMPLANT N/A 12/18/2016   Procedure: Pacemaker Implant;  Surgeon: Evans Lance, MD;  Location: [place] [facility];  Service: Cardiovascular;  Laterality: N/A;     Current Meds  Medication Sig  . amLODipine (NORVASC) 10 MG tablet Take 10 mg by mouth daily.  . hydrochlorothiazide (HYDRODIURIL) 25 MG tablet Take 1 tablet by mouth daily.  Marland Kitchen losartan (COZAAR) 100 MG tablet Take 1 tablet by mouth daily.     Allergies:   Patient has no known allergies.   Social History   Tobacco Use  . Smoking status: Never Smoker  . Smokeless tobacco: Former Systems developer    Types: Snuff  Substance Use Topics  . Alcohol use: Yes    Alcohol/week: 0.0 standard drinks    Comment: 12/18/2016 "<10 drinks/year"  . Drug use: No     Family Hx: The patient's family history includes Arthritis in his mother; Colon cancer in his father; Healthy in his brother and sister; Lung cancer in his father and mother.  ROS:   Please see the history of present illness.    As mentioned above All other systems reviewed and are negative.   Prior CV studies:   The following studies were reviewed today:  I reviewed my findings with the patient  at extensive length including pacemaker evaluation findings.  Electrophysiologist  Labs/Other Tests and Data Reviewed:    EKG:  EKG was unremarkable and this was done the last time and I reviewed it.  Recent Labs: No results found for requested labs within last 8760 hours.   Recent Lipid Panel No results found for: CHOL, TRIG, HDL, CHOLHDL, LDLCALC, LDLDIRECT  Wt Readings from Last 3 Encounters:  10/14/19 (!) 324 lb (147 kg)  08/28/19 (!) 340 lb 9.6 oz (154.5 kg)  02/25/19 (!) 307 lb (139.3 kg)     Objective:    Vital Signs:  BP 124/77   Pulse 69   Ht 6\' 4"  (1.93 m)   Wt (!) 324 lb (147 kg)   BMI 39.44 kg/m    VITAL  SIGNS:  reviewed  ASSESSMENT & PLAN:    1. Essential hypertension: Blood pressure stable.  Diet was discussed.  Salt intake issues were also discussed. Morbid obesity: Risks explained to the patient.  Diet and exercise stressed and he promises to do so. Mixed dyslipidemia: Lipids followed by primary care physician and is going have complete blood work done in the next few days Permanent pacemaker: Electrophysiology record review reveals satisfactory function Patient will be seen in follow-up appointment in 6 months or earlier if the patient has any concerns  COVID-19 Education: The signs and symptoms of COVID-19 were discussed with the patient and how to seek care for testing (follow up with PCP or arrange E-visit).  The importance of social distancing was discussed today.  Time:   Today, I have spent 15 minutes with the patient with telehealth technology discussing the above problems.     Medication Adjustments/Labs and Tests Ordered: Current medicines are reviewed at length with the patient today.  Concerns regarding medicines are outlined above.   Tests Ordered: No orders of the defined types were placed in this encounter.   Medication Changes: No orders of the defined types were placed in this encounter.   Follow Up:  Either In Person or Virtual in 6 month(s)  Signed, Jenean Lindau, MD  10/14/2019 10:54 AM    [place]

## 2019-10-14 NOTE — Patient Instructions (Signed)
Medication I nstructions:  Your physician recommends that you continue on your current medications as directed. Please refer to the Current Medication list given to you today.  *If you need a refill on your cardiac medications before your next appointment, please call your pharmacy*  Lab Work: None Ordered  Testing/Procedures: None Ordered   Follow-Up: At [facility], you and your health needs are our priority.  As part of our continuing mission to provide you with exceptional heart care, we have created designated Provider Care Teams.  These Care Teams include your primary Cardiologist (physician) and Advanced Practice Providers (APPs -  Physician Assistants and Nurse Practitioners) who all work together to provide you with the care you need, when you need it.  Your next appointment:   6 month(s)  The format for your next appointment:   In Person  Provider:   Rajan Revankar, MD  

## 2019-11-05 LAB — CUP PACEART INCLINIC DEVICE CHECK
Date Time Interrogation Session: 20201030113615
Implantable Lead Implant Date: 20180220
Implantable Lead Implant Date: 20180220
Implantable Lead Location: 753859
Implantable Lead Location: 753860
Implantable Lead Model: 3830
Implantable Lead Model: 5076
Implantable Pulse Generator Implant Date: 20180220

## 2019-11-20 ENCOUNTER — Ambulatory Visit (INDEPENDENT_AMBULATORY_CARE_PROVIDER_SITE_OTHER): Payer: 59 | Admitting: *Deleted

## 2019-11-20 DIAGNOSIS — I441 Atrioventricular block, second degree: Secondary | ICD-10-CM | POA: Diagnosis not present

## 2019-11-20 LAB — CUP PACEART REMOTE DEVICE CHECK
Battery Remaining Longevity: 53 mo
Battery Voltage: 2.94 V
Brady Statistic AP VP Percent: 0.75 %
Brady Statistic AP VS Percent: 1.63 %
Brady Statistic AS VP Percent: 50.83 %
Brady Statistic AS VS Percent: 46.79 %
Brady Statistic RA Percent Paced: 2.35 %
Brady Statistic RV Percent Paced: 51.58 %
Date Time Interrogation Session: 20210121231247
Implantable Lead Implant Date: 20180220
Implantable Lead Implant Date: 20180220
Implantable Lead Location: 753859
Implantable Lead Location: 753860
Implantable Lead Model: 3830
Implantable Lead Model: 5076
Implantable Pulse Generator Implant Date: 20180220
Lead Channel Impedance Value: 323 Ohm
Lead Channel Impedance Value: 342 Ohm
Lead Channel Impedance Value: 437 Ohm
Lead Channel Impedance Value: 532 Ohm
Lead Channel Pacing Threshold Amplitude: 0.75 V
Lead Channel Pacing Threshold Amplitude: 2.5 V
Lead Channel Pacing Threshold Pulse Width: 0.4 ms
Lead Channel Pacing Threshold Pulse Width: 0.4 ms
Lead Channel Sensing Intrinsic Amplitude: 3 mV
Lead Channel Sensing Intrinsic Amplitude: 3 mV
Lead Channel Sensing Intrinsic Amplitude: 7 mV
Lead Channel Sensing Intrinsic Amplitude: 7 mV
Lead Channel Setting Pacing Amplitude: 1.5 V
Lead Channel Setting Pacing Amplitude: 6 V
Lead Channel Setting Pacing Pulse Width: 1 ms
Lead Channel Setting Sensing Sensitivity: 4 mV

## 2020-02-19 ENCOUNTER — Ambulatory Visit (INDEPENDENT_AMBULATORY_CARE_PROVIDER_SITE_OTHER): Payer: 59 | Admitting: *Deleted

## 2020-02-19 DIAGNOSIS — I441 Atrioventricular block, second degree: Secondary | ICD-10-CM | POA: Diagnosis not present

## 2020-02-19 LAB — CUP PACEART REMOTE DEVICE CHECK
Battery Remaining Longevity: 42 mo
Battery Voltage: 2.94 V
Brady Statistic AP VP Percent: 2.35 %
Brady Statistic AP VS Percent: 0.92 %
Brady Statistic AS VP Percent: 85.21 %
Brady Statistic AS VS Percent: 11.52 %
Brady Statistic RA Percent Paced: 3.27 %
Brady Statistic RV Percent Paced: 87.56 %
Date Time Interrogation Session: 20210422224717
Implantable Lead Implant Date: 20180220
Implantable Lead Implant Date: 20180220
Implantable Lead Location: 753859
Implantable Lead Location: 753860
Implantable Lead Model: 3830
Implantable Lead Model: 5076
Implantable Pulse Generator Implant Date: 20180220
Lead Channel Impedance Value: 304 Ohm
Lead Channel Impedance Value: 323 Ohm
Lead Channel Impedance Value: 437 Ohm
Lead Channel Impedance Value: 437 Ohm
Lead Channel Pacing Threshold Amplitude: 0.5 V
Lead Channel Pacing Threshold Amplitude: 2.5 V
Lead Channel Pacing Threshold Pulse Width: 0.4 ms
Lead Channel Pacing Threshold Pulse Width: 0.4 ms
Lead Channel Sensing Intrinsic Amplitude: 3.125 mV
Lead Channel Sensing Intrinsic Amplitude: 3.125 mV
Lead Channel Sensing Intrinsic Amplitude: 6.125 mV
Lead Channel Sensing Intrinsic Amplitude: 6.125 mV
Lead Channel Setting Pacing Amplitude: 1.5 V
Lead Channel Setting Pacing Amplitude: 6 V
Lead Channel Setting Pacing Pulse Width: 1 ms
Lead Channel Setting Sensing Sensitivity: 4 mV

## 2020-02-19 NOTE — Progress Notes (Signed)
PPM Remote  

## 2020-05-12 ENCOUNTER — Encounter: Payer: Self-pay | Admitting: Internal Medicine

## 2020-05-12 ENCOUNTER — Ambulatory Visit: Payer: 59 | Admitting: Internal Medicine

## 2020-05-12 ENCOUNTER — Other Ambulatory Visit: Payer: Self-pay

## 2020-05-12 VITALS — BP 122/68 | HR 76 | Ht 76.0 in | Wt 335.0 lb

## 2020-05-12 DIAGNOSIS — I10 Essential (primary) hypertension: Secondary | ICD-10-CM

## 2020-05-12 DIAGNOSIS — Z950 Presence of cardiac pacemaker: Secondary | ICD-10-CM

## 2020-05-12 DIAGNOSIS — I441 Atrioventricular block, second degree: Secondary | ICD-10-CM

## 2020-05-12 NOTE — Patient Instructions (Addendum)
Medication Instructions:  Your physician recommends that you continue on your current medications as directed. Please refer to the Current Medication list given to you today.  Labwork: None ordered.  Testing/Procedures: None ordered.  Follow-Up: Your physician wants you to follow-up in: 6 months with Dr. Lovena Le.   You will receive a reminder letter in the mail two months in advance. If you don't receive a letter, please call our office to schedule the follow-up appointment.  Remote monitoring is used to monitor your Pacemaker from home. This monitoring reduces the number of office visits required to check your device to one time per year. It allows [place] to keep an eye on the functioning of your device to ensure it is working properly. You are scheduled for a device check from home on 05/20/2020. You may send your transmission at any time that day. If you have a wireless device, the transmission will be sent automatically. After your physician reviews your transmission, you will receive a postcard with your next transmission date.  Any Other Special Instructions Will Be Listed Below (If Applicable).  If you need a refill on your cardiac medications before your next appointment, please call your pharmacy.

## 2020-05-12 NOTE — Progress Notes (Signed)
HPI Mr. Schooler returns today for followup. He is a pleasant 64 yo man with mobitz 2, second degree AV block and morbid obesity, s/p PPM insertion. He has lost about 5 lbs. He denies chest pain. No dyspnea. He has mild peripheral edema but has been on norvasc. He admits to dietary indiscretion with sodium. He wonders why his oxygen saturation is 92%. No cough or hemoptysis or leg pain.  No Known Allergies   Current Outpatient Medications  Medication Sig Dispense Refill  . amLODipine (NORVASC) 10 MG tablet Take 10 mg by mouth daily.    . hydrochlorothiazide (HYDRODIURIL) 25 MG tablet Take 1 tablet by mouth daily.    Marland Kitchen losartan (COZAAR) 100 MG tablet Take 1 tablet by mouth daily.     No current facility-administered medications for this visit.     Past Medical History:  Diagnosis Date  . AV block, 2nd degree    /notes 12/18/2016  . Headache    "had them when I woke up before getting my CPAP; never had migraines" (12/18/2016)  . HTN (hypertension)   . OSA on CPAP   . Pneumonia ~ 2012  . Presence of permanent cardiac pacemaker   . Right bundle branch block    /notes 12/18/2016    ROS:   All systems reviewed and negative except as noted in the HPI.   Past Surgical History:  Procedure Laterality Date  . COLONOSCOPY  X 2  . INSERT / REPLACE / REMOVE PACEMAKER  12/18/2016  . PACEMAKER IMPLANT N/A 12/18/2016   Procedure: Pacemaker Implant;  Surgeon: Evans Lance, MD;  Location: [place] [facility];  Service: Cardiovascular;  Laterality: N/A;     Family History  Problem Relation Age of Onset  . Lung cancer Mother   . Arthritis Mother   . Colon cancer Father   . Lung cancer Father   . Healthy Brother        x3  . Healthy Sister      Social History   Socioeconomic History  . Marital status: Divorced    Spouse name: Not on file  . Number of children: 2  . Years of education: Not on file  . Highest education level: Not on file  Occupational History  . Not on  file  Tobacco Use  . Smoking status: Never Smoker  . Smokeless tobacco: Former Systems developer    Types: Snuff  Vaping Use  . Vaping Use: Never used  Substance and Sexual Activity  . Alcohol use: Yes    Alcohol/week: 0.0 standard drinks    Comment: 12/18/2016 "<10 drinks/year"  . Drug use: No  . Sexual activity: Not Currently  Other Topics Concern  . Not on file  Social History Narrative  . Not on file   Social Determinants of Health   Financial Resource Strain:   . Difficulty of Paying Living Expenses:   Food Insecurity:   . Worried About Charity fundraiser in the Last Year:   . Arboriculturist in the Last Year:   Transportation Needs:   . Film/video editor (Medical):   Marland Kitchen Lack of Transportation (Non-Medical):   Physical Activity:   . Days of Exercise per Week:   . Minutes of Exercise per Session:   Stress:   . Feeling of Stress :   Social Connections:   . Frequency of Communication with Friends and Family:   . Frequency of Social Gatherings with Friends and Family:   .  Attends Religious Services:   . Active Member of Clubs or Organizations:   . Attends Archivist Meetings:   Marland Kitchen Marital Status:   Intimate Partner Violence:   . Fear of Current or Ex-Partner:   . Emotionally Abused:   Marland Kitchen Physically Abused:   . Sexually Abused:      BP 122/68   Pulse 76   Ht 6\' 4"  (1.93 m)   Wt (!) 335 lb (152 kg)   SpO2 92%   BMI 40.78 kg/m   Physical Exam:  Well appearing NAD HEENT: Unremarkable Neck:  No JVD, no thyromegally Lymphatics:  No adenopathy Back:  No CVA tenderness Lungs:  Clear with no wheezes HEART:  Regular rate rhythm, no murmurs, no rubs, no clicks Abd:  soft, positive bowel sounds, no organomegally, no rebound, no guarding Ext:  2 plus pulses, no edema, no cyanosis, no clubbing Skin:  No rashes no nodules Neuro:  CN II through XII intact, motor grossly intact  EKG - nsr with RBBB  DEVICE  Normal device function.  See PaceArt for details.  Chronically elevated RV threshold.   Assess/Plan: 1. Mobitz 2, second degree AV block - he is asymptomatic, s/p PPM insertion.  2. PPM - he has chronically elevated but currently stable pacing thresholds. I would anticipate a lead revision at the time of PM change out. 3. Obesity - we discussed the importance of weight loss.  4. Chronic diastolic heart failure - Again I encouraged weight loss and a low sodium diet. He will continue his current meds. He does have some peripheral edema and I offered to stop the amlodipine which is contributing but he prefers to continue for now.  Salome Spotted.

## 2020-05-20 ENCOUNTER — Ambulatory Visit (INDEPENDENT_AMBULATORY_CARE_PROVIDER_SITE_OTHER): Payer: 59 | Admitting: *Deleted

## 2020-05-20 DIAGNOSIS — I441 Atrioventricular block, second degree: Secondary | ICD-10-CM

## 2020-05-20 LAB — CUP PACEART REMOTE DEVICE CHECK
Battery Remaining Longevity: 45 mo
Battery Voltage: 3 V
Brady Statistic AP VP Percent: 0.45 %
Brady Statistic AP VS Percent: 8.16 %
Brady Statistic AS VP Percent: 0.07 %
Brady Statistic AS VS Percent: 91.31 %
Brady Statistic RA Percent Paced: 8.27 %
Brady Statistic RV Percent Paced: 0.52 %
Date Time Interrogation Session: 20210723072404
Implantable Lead Implant Date: 20180220
Implantable Lead Implant Date: 20180220
Implantable Lead Location: 753859
Implantable Lead Location: 753860
Implantable Lead Model: 3830
Implantable Lead Model: 5076
Implantable Pulse Generator Implant Date: 20180220
Lead Channel Impedance Value: 304 Ohm
Lead Channel Impedance Value: 323 Ohm
Lead Channel Impedance Value: 399 Ohm
Lead Channel Impedance Value: 418 Ohm
Lead Channel Pacing Threshold Amplitude: 0.5 V
Lead Channel Pacing Threshold Amplitude: 2.5 V
Lead Channel Pacing Threshold Pulse Width: 0.4 ms
Lead Channel Pacing Threshold Pulse Width: 0.4 ms
Lead Channel Sensing Intrinsic Amplitude: 3.5 mV
Lead Channel Sensing Intrinsic Amplitude: 3.5 mV
Lead Channel Sensing Intrinsic Amplitude: 5.5 mV
Lead Channel Sensing Intrinsic Amplitude: 5.5 mV
Lead Channel Setting Pacing Amplitude: 1.5 V
Lead Channel Setting Pacing Amplitude: 6 V
Lead Channel Setting Pacing Pulse Width: 1 ms
Lead Channel Setting Sensing Sensitivity: 4 mV

## 2020-05-23 NOTE — Progress Notes (Signed)
Remote pacemaker transmission.   

## 2020-05-25 ENCOUNTER — Ambulatory Visit: Payer: 59 | Admitting: Cardiology

## 2020-08-19 ENCOUNTER — Ambulatory Visit (INDEPENDENT_AMBULATORY_CARE_PROVIDER_SITE_OTHER): Payer: 59

## 2020-08-19 DIAGNOSIS — I441 Atrioventricular block, second degree: Secondary | ICD-10-CM | POA: Diagnosis not present

## 2020-08-20 LAB — CUP PACEART REMOTE DEVICE CHECK
Battery Remaining Longevity: 33 mo
Battery Voltage: 2.92 V
Brady Statistic AP VP Percent: 0.68 %
Brady Statistic AP VS Percent: 1.84 %
Brady Statistic AS VP Percent: 54.94 %
Brady Statistic AS VS Percent: 42.54 %
Brady Statistic RA Percent Paced: 2.49 %
Brady Statistic RV Percent Paced: 55.62 %
Date Time Interrogation Session: 20211021235348
Implantable Lead Implant Date: 20180220
Implantable Lead Implant Date: 20180220
Implantable Lead Location: 753859
Implantable Lead Location: 753860
Implantable Lead Model: 3830
Implantable Lead Model: 5076
Implantable Pulse Generator Implant Date: 20180220
Lead Channel Impedance Value: 323 Ohm
Lead Channel Impedance Value: 323 Ohm
Lead Channel Impedance Value: 418 Ohm
Lead Channel Impedance Value: 456 Ohm
Lead Channel Pacing Threshold Amplitude: 0.5 V
Lead Channel Pacing Threshold Amplitude: 2.5 V
Lead Channel Pacing Threshold Pulse Width: 0.4 ms
Lead Channel Pacing Threshold Pulse Width: 0.4 ms
Lead Channel Sensing Intrinsic Amplitude: 3.625 mV
Lead Channel Sensing Intrinsic Amplitude: 3.625 mV
Lead Channel Sensing Intrinsic Amplitude: 5.625 mV
Lead Channel Sensing Intrinsic Amplitude: 5.625 mV
Lead Channel Setting Pacing Amplitude: 1.5 V
Lead Channel Setting Pacing Amplitude: 6 V
Lead Channel Setting Pacing Pulse Width: 1 ms
Lead Channel Setting Sensing Sensitivity: 4 mV

## 2020-08-24 NOTE — Progress Notes (Signed)
Remote pacemaker transmission.   

## 2020-10-29 HISTORY — PX: COLONOSCOPY: SHX174

## 2020-11-18 ENCOUNTER — Ambulatory Visit (INDEPENDENT_AMBULATORY_CARE_PROVIDER_SITE_OTHER): Payer: 59

## 2020-11-18 DIAGNOSIS — I441 Atrioventricular block, second degree: Secondary | ICD-10-CM

## 2020-11-18 LAB — CUP PACEART REMOTE DEVICE CHECK
Battery Remaining Longevity: 24 mo
Battery Voltage: 2.91 V
Brady Statistic AP VP Percent: 0.74 %
Brady Statistic AP VS Percent: 0.08 %
Brady Statistic AS VP Percent: 92.15 %
Brady Statistic AS VS Percent: 7.02 %
Brady Statistic RA Percent Paced: 0.93 %
Brady Statistic RV Percent Paced: 92.89 %
Date Time Interrogation Session: 20220121000703
Implantable Lead Implant Date: 20180220
Implantable Lead Implant Date: 20180220
Implantable Lead Location: 753859
Implantable Lead Location: 753860
Implantable Lead Model: 3830
Implantable Lead Model: 5076
Implantable Pulse Generator Implant Date: 20180220
Lead Channel Impedance Value: 323 Ohm
Lead Channel Impedance Value: 323 Ohm
Lead Channel Impedance Value: 399 Ohm
Lead Channel Impedance Value: 437 Ohm
Lead Channel Pacing Threshold Amplitude: 0.5 V
Lead Channel Pacing Threshold Amplitude: 2.5 V
Lead Channel Pacing Threshold Pulse Width: 0.4 ms
Lead Channel Pacing Threshold Pulse Width: 0.4 ms
Lead Channel Sensing Intrinsic Amplitude: 3.625 mV
Lead Channel Sensing Intrinsic Amplitude: 3.625 mV
Lead Channel Sensing Intrinsic Amplitude: 4.625 mV
Lead Channel Sensing Intrinsic Amplitude: 4.625 mV
Lead Channel Setting Pacing Amplitude: 1.5 V
Lead Channel Setting Pacing Amplitude: 6 V
Lead Channel Setting Pacing Pulse Width: 1 ms
Lead Channel Setting Sensing Sensitivity: 4 mV

## 2020-11-30 NOTE — Progress Notes (Signed)
Remote pacemaker transmission.   

## 2021-02-17 ENCOUNTER — Ambulatory Visit (INDEPENDENT_AMBULATORY_CARE_PROVIDER_SITE_OTHER): Payer: 59

## 2021-02-17 DIAGNOSIS — I441 Atrioventricular block, second degree: Secondary | ICD-10-CM

## 2021-02-18 LAB — CUP PACEART REMOTE DEVICE CHECK
Battery Remaining Longevity: 16 mo
Battery Voltage: 2.9 V
Brady Statistic AP VP Percent: 0.47 %
Brady Statistic AP VS Percent: 0.3 %
Brady Statistic AS VP Percent: 75.64 %
Brady Statistic AS VS Percent: 23.59 %
Brady Statistic RA Percent Paced: 0.74 %
Brady Statistic RV Percent Paced: 76.11 %
Date Time Interrogation Session: 20220421235034
Implantable Lead Implant Date: 20180220
Implantable Lead Implant Date: 20180220
Implantable Lead Location: 753859
Implantable Lead Location: 753860
Implantable Lead Model: 3830
Implantable Lead Model: 5076
Implantable Pulse Generator Implant Date: 20180220
Lead Channel Impedance Value: 323 Ohm
Lead Channel Impedance Value: 323 Ohm
Lead Channel Impedance Value: 380 Ohm
Lead Channel Impedance Value: 475 Ohm
Lead Channel Pacing Threshold Amplitude: 0.5 V
Lead Channel Pacing Threshold Amplitude: 2.5 V
Lead Channel Pacing Threshold Pulse Width: 0.4 ms
Lead Channel Pacing Threshold Pulse Width: 0.4 ms
Lead Channel Sensing Intrinsic Amplitude: 3.25 mV
Lead Channel Sensing Intrinsic Amplitude: 3.25 mV
Lead Channel Sensing Intrinsic Amplitude: 5.25 mV
Lead Channel Sensing Intrinsic Amplitude: 5.25 mV
Lead Channel Setting Pacing Amplitude: 1.5 V
Lead Channel Setting Pacing Amplitude: 6 V
Lead Channel Setting Pacing Pulse Width: 1 ms
Lead Channel Setting Sensing Sensitivity: 4 mV

## 2021-03-08 NOTE — Progress Notes (Signed)
Remote pacemaker transmission.   

## 2021-05-19 ENCOUNTER — Ambulatory Visit (INDEPENDENT_AMBULATORY_CARE_PROVIDER_SITE_OTHER): Payer: 59

## 2021-05-19 DIAGNOSIS — I441 Atrioventricular block, second degree: Secondary | ICD-10-CM | POA: Diagnosis not present

## 2021-05-22 LAB — CUP PACEART REMOTE DEVICE CHECK
Battery Remaining Longevity: 12 mo
Battery Voltage: 2.88 V
Brady Statistic AP VP Percent: 0.57 %
Brady Statistic AP VS Percent: 0.03 %
Brady Statistic AS VP Percent: 91.57 %
Brady Statistic AS VS Percent: 7.82 %
Brady Statistic RA Percent Paced: 0.68 %
Brady Statistic RV Percent Paced: 92.15 %
Date Time Interrogation Session: 20220721204844
Implantable Lead Implant Date: 20180220
Implantable Lead Implant Date: 20180220
Implantable Lead Location: 753859
Implantable Lead Location: 753860
Implantable Lead Model: 3830
Implantable Lead Model: 5076
Implantable Pulse Generator Implant Date: 20180220
Lead Channel Impedance Value: 323 Ohm
Lead Channel Impedance Value: 342 Ohm
Lead Channel Impedance Value: 380 Ohm
Lead Channel Impedance Value: 475 Ohm
Lead Channel Pacing Threshold Amplitude: 0.5 V
Lead Channel Pacing Threshold Amplitude: 2.5 V
Lead Channel Pacing Threshold Pulse Width: 0.4 ms
Lead Channel Pacing Threshold Pulse Width: 0.4 ms
Lead Channel Sensing Intrinsic Amplitude: 3.125 mV
Lead Channel Sensing Intrinsic Amplitude: 3.125 mV
Lead Channel Sensing Intrinsic Amplitude: 4.625 mV
Lead Channel Sensing Intrinsic Amplitude: 4.625 mV
Lead Channel Setting Pacing Amplitude: 1.5 V
Lead Channel Setting Pacing Amplitude: 6 V
Lead Channel Setting Pacing Pulse Width: 1 ms
Lead Channel Setting Sensing Sensitivity: 4 mV

## 2021-06-12 NOTE — Progress Notes (Signed)
Remote pacemaker transmission.   

## 2021-08-18 ENCOUNTER — Ambulatory Visit (INDEPENDENT_AMBULATORY_CARE_PROVIDER_SITE_OTHER): Payer: 59

## 2021-08-18 DIAGNOSIS — I441 Atrioventricular block, second degree: Secondary | ICD-10-CM

## 2021-08-20 LAB — CUP PACEART REMOTE DEVICE CHECK
Battery Remaining Longevity: 20 mo
Battery Voltage: 2.89 V
Brady Statistic AP VP Percent: 0.99 %
Brady Statistic AP VS Percent: 0.21 %
Brady Statistic AS VP Percent: 83.49 %
Brady Statistic AS VS Percent: 15.31 %
Brady Statistic RA Percent Paced: 1.19 %
Brady Statistic RV Percent Paced: 84.47 %
Date Time Interrogation Session: 20221021003153
Implantable Lead Implant Date: 20180220
Implantable Lead Implant Date: 20180220
Implantable Lead Location: 753859
Implantable Lead Location: 753860
Implantable Lead Model: 3830
Implantable Lead Model: 5076
Implantable Pulse Generator Implant Date: 20180220
Lead Channel Impedance Value: 304 Ohm
Lead Channel Impedance Value: 342 Ohm
Lead Channel Impedance Value: 361 Ohm
Lead Channel Impedance Value: 456 Ohm
Lead Channel Pacing Threshold Amplitude: 0.375 V
Lead Channel Pacing Threshold Amplitude: 2.5 V
Lead Channel Pacing Threshold Pulse Width: 0.4 ms
Lead Channel Pacing Threshold Pulse Width: 0.4 ms
Lead Channel Sensing Intrinsic Amplitude: 3.875 mV
Lead Channel Sensing Intrinsic Amplitude: 3.875 mV
Lead Channel Sensing Intrinsic Amplitude: 5.5 mV
Lead Channel Sensing Intrinsic Amplitude: 5.5 mV
Lead Channel Setting Pacing Amplitude: 1.5 V
Lead Channel Setting Pacing Amplitude: 6 V
Lead Channel Setting Pacing Pulse Width: 1 ms
Lead Channel Setting Sensing Sensitivity: 4 mV

## 2021-08-28 NOTE — Progress Notes (Signed)
Remote pacemaker transmission.   

## 2021-11-17 ENCOUNTER — Telehealth: Payer: Self-pay

## 2021-11-17 ENCOUNTER — Ambulatory Visit (INDEPENDENT_AMBULATORY_CARE_PROVIDER_SITE_OTHER): Payer: 59

## 2021-11-17 DIAGNOSIS — I441 Atrioventricular block, second degree: Secondary | ICD-10-CM

## 2021-11-17 LAB — CUP PACEART REMOTE DEVICE CHECK
Battery Remaining Longevity: 5 mo
Battery Voltage: 2.82 V
Brady Statistic AP VP Percent: 0.78 %
Brady Statistic AP VS Percent: 0.1 %
Brady Statistic AS VP Percent: 93.7 %
Brady Statistic AS VS Percent: 5.42 %
Brady Statistic RA Percent Paced: 0.9 %
Brady Statistic RV Percent Paced: 94.48 %
Date Time Interrogation Session: 20230120081041
Implantable Lead Implant Date: 20180220
Implantable Lead Implant Date: 20180220
Implantable Lead Location: 753859
Implantable Lead Location: 753860
Implantable Lead Model: 3830
Implantable Lead Model: 5076
Implantable Pulse Generator Implant Date: 20180220
Lead Channel Impedance Value: 304 Ohm
Lead Channel Impedance Value: 342 Ohm
Lead Channel Impedance Value: 380 Ohm
Lead Channel Impedance Value: 399 Ohm
Lead Channel Pacing Threshold Amplitude: 0.5 V
Lead Channel Pacing Threshold Amplitude: 2.5 V
Lead Channel Pacing Threshold Pulse Width: 0.4 ms
Lead Channel Pacing Threshold Pulse Width: 0.4 ms
Lead Channel Sensing Intrinsic Amplitude: 4.125 mV
Lead Channel Sensing Intrinsic Amplitude: 4.125 mV
Lead Channel Sensing Intrinsic Amplitude: 6 mV
Lead Channel Sensing Intrinsic Amplitude: 6 mV
Lead Channel Setting Pacing Amplitude: 1.5 V
Lead Channel Setting Pacing Amplitude: 6 V
Lead Channel Setting Pacing Pulse Width: 1 ms
Lead Channel Setting Sensing Sensitivity: 4 mV

## 2021-11-17 NOTE — Telephone Encounter (Signed)
LVM informing patient that he had about 5 months on [name] until elective replacement interval (ERI) also informed him we would be scheduling monthly battery checks, direct number to device clinic given for questions.

## 2021-11-30 NOTE — Progress Notes (Signed)
Remote pacemaker transmission.   

## 2021-12-18 ENCOUNTER — Ambulatory Visit (INDEPENDENT_AMBULATORY_CARE_PROVIDER_SITE_OTHER): Payer: 59

## 2021-12-18 DIAGNOSIS — I441 Atrioventricular block, second degree: Secondary | ICD-10-CM

## 2021-12-19 LAB — CUP PACEART REMOTE DEVICE CHECK
Battery Remaining Longevity: 3 mo
Battery Voltage: 2.79 V
Brady Statistic AP VP Percent: 0.38 %
Brady Statistic AP VS Percent: 0 %
Brady Statistic AS VP Percent: 99.32 %
Brady Statistic AS VS Percent: 0.3 %
Brady Statistic RA Percent Paced: 0.43 %
Brady Statistic RV Percent Paced: 99.7 %
Date Time Interrogation Session: 20230219224549
Implantable Lead Implant Date: 20180220
Implantable Lead Implant Date: 20180220
Implantable Lead Location: 753859
Implantable Lead Location: 753860
Implantable Lead Model: 3830
Implantable Lead Model: 5076
Implantable Pulse Generator Implant Date: 20180220
Lead Channel Impedance Value: 304 Ohm
Lead Channel Impedance Value: 323 Ohm
Lead Channel Impedance Value: 361 Ohm
Lead Channel Impedance Value: 399 Ohm
Lead Channel Pacing Threshold Amplitude: 0.5 V
Lead Channel Pacing Threshold Amplitude: 2.5 V
Lead Channel Pacing Threshold Pulse Width: 0.4 ms
Lead Channel Pacing Threshold Pulse Width: 0.4 ms
Lead Channel Sensing Intrinsic Amplitude: 3.375 mV
Lead Channel Sensing Intrinsic Amplitude: 3.375 mV
Lead Channel Sensing Intrinsic Amplitude: 6 mV
Lead Channel Sensing Intrinsic Amplitude: 6 mV
Lead Channel Setting Pacing Amplitude: 1.5 V
Lead Channel Setting Pacing Amplitude: 6 V
Lead Channel Setting Pacing Pulse Width: 1 ms
Lead Channel Setting Sensing Sensitivity: 4 mV

## 2021-12-21 NOTE — Addendum Note (Signed)
Addended by: Douglass Rivers D on: 12/21/2021 11:58 AM   Modules accepted: Level of Service

## 2021-12-21 NOTE — Progress Notes (Signed)
Remote pacemaker transmission.   

## 2021-12-29 ENCOUNTER — Ambulatory Visit: Payer: 59 | Admitting: Internal Medicine

## 2021-12-29 ENCOUNTER — Other Ambulatory Visit: Payer: Self-pay

## 2021-12-29 ENCOUNTER — Telehealth: Payer: Self-pay

## 2021-12-29 ENCOUNTER — Encounter: Payer: Self-pay | Admitting: Internal Medicine

## 2021-12-29 VITALS — BP 110/76 | HR 84 | Ht 76.0 in | Wt 354.0 lb

## 2021-12-29 DIAGNOSIS — I10 Essential (primary) hypertension: Secondary | ICD-10-CM

## 2021-12-29 DIAGNOSIS — I441 Atrioventricular block, second degree: Secondary | ICD-10-CM

## 2021-12-29 DIAGNOSIS — T82110D Breakdown (mechanical) of cardiac electrode, subsequent encounter: Secondary | ICD-10-CM

## 2021-12-29 DIAGNOSIS — Z950 Presence of cardiac pacemaker: Secondary | ICD-10-CM | POA: Diagnosis not present

## 2021-12-29 NOTE — Progress Notes (Signed)
? ? ? ? ?HPI ?Juan Hanson returns today for followup. He is a pleasant 66 yo man with mobitz 2, second degree AV block and morbid obesity, s/p PPM insertion. l He denies chest pain. No dyspnea. He has mild peripheral edema but has been on norvasc. He admits to dietary indiscretion with sodium. His weight is up almost 20 lbs. ?No Known Allergies ? ? ?Current Outpatient Medications  ?Medication Sig Dispense Refill  ? amLODipine (NORVASC) 10 MG tablet Take 10 mg by mouth daily.    ? hydrochlorothiazide (HYDRODIURIL) 25 MG tablet Take 1 tablet by mouth daily.    ? losartan (COZAAR) 100 MG tablet Take 1 tablet by mouth daily.    ? ?No current facility-administered medications for this visit.  ? ? ? ?Past Medical History:  ?Diagnosis Date  ? AV block, 2nd degree   ? /notes 12/18/2016  ? Headache   ? "had them when I woke up before getting my CPAP; never had migraines" (12/18/2016)  ? HTN (hypertension)   ? OSA on CPAP   ? Pneumonia ~ 2012  ? Presence of permanent cardiac pacemaker   ? Right bundle branch block   ? /notes 12/18/2016  ? ? ?ROS: ? ? All systems reviewed and negative except as noted in the HPI. ? ? ?Past Surgical History:  ?Procedure Laterality Date  ? COLONOSCOPY  X 2  ? INSERT / REPLACE / REMOVE PACEMAKER  12/18/2016  ? PACEMAKER IMPLANT N/A 12/18/2016  ? Procedure: Pacemaker Implant;  Surgeon: Evans Lance, MD;  Location: [facility];  Service: Cardiovascular;  Laterality: N/A;  ? ? ? ?Family History  ?Problem Relation Age of Onset  ? Lung cancer Mother   ? Arthritis Mother   ? Colon cancer Father   ? Lung cancer Father   ? Healthy Brother   ?     x3  ? Healthy Sister   ? ? ? ?Social History  ? ?Socioeconomic History  ? Marital status: Divorced  ?  Spouse name: Not on file  ? Number of children: 2  ? Years of education: Not on file  ? Highest education level: Not on file  ?Occupational History  ? Not on file  ?Tobacco Use  ? Smoking status: Never  ? Smokeless tobacco: Former  ?  Types: Snuff  ?  Quit  date: 06/11/2016  ?Vaping Use  ? Vaping Use: Never used  ?Substance and Sexual Activity  ? Alcohol use: Yes  ?  Alcohol/week: 0.0 standard drinks  ?  Comment: 12/18/2016 "<10 drinks/year"  ? Drug use: No  ? Sexual activity: Not Currently  ?Other Topics Concern  ? Not on file  ?Social History Narrative  ? Not on file  ? ?Social Determinants of Health  ? ?Financial Resource Strain: Not on file  ?Food Insecurity: Not on file  ?Transportation Needs: Not on file  ?Physical Activity: Not on file  ?Stress: Not on file  ?Social Connections: Not on file  ?Intimate Partner Violence: Not on file  ? ? ? ?BP 110/76   Pulse 84   Ht 6\' 4"  (1.93 m)   Wt (!) 354 lb (160.6 kg)   SpO2 95%   BMI 43.09 kg/m?  ? ?Physical Exam: ? ?Morbidly obese appearing NAD ?HEENT: Unremarkable ?Neck:  No JVD, no thyromegally ?Lymphatics:  No adenopathy ?Back:  No CVA tenderness ?Lungs:  Clear with no wheezes ?HEART:  Regular rate rhythm, no murmurs, no rubs, no clicks ?Abd:  soft, positive bowel  sounds, no organomegally, no rebound, no guarding ?Ext:  2 plus pulses, no edema, no cyanosis, no clubbing ?Skin:  No rashes no nodules ?Neuro:  CN II through XII intact, motor grossly intact ? ?EKG - NSR with ventricular pacing ? ?DEVICE  ?Normal device function.  See PaceArt for details. Pacing thresholds are reduced in the RV ? ?Assess/Plan:  ?1. Mobitz 2, second degree AV block - he is now in CHB.  ?2. PPM - he has chronically elevated. He is approaching ERI. We will get him in sooner as his outputs are increased. I have discussed the treatment options. I have recommended removal of the current lead and insertion of a new RV lead in the left bundle area rather than the His region. I have discussed the indications/risks/;benefits/goals/expectations and he would like to proceed. ?3. Obesity - we discussed the importance of weight loss.  ?4. Chronic diastolic heart failure - Again I encouraged weight loss and a low sodium diet. He will continue his current  meds. He will continue his current meds. ?  ?Salome Spotted. ?

## 2021-12-29 NOTE — Patient Instructions (Signed)
Medication Instructions:  ?Your physician recommends that you continue on your current medications as directed. Please refer to the Current Medication list given to you today. ? ?Labwork: ?None ordered. ? ?Testing/Procedures: ?None ordered. ? ?Follow-Up: ?Your physician wants you to follow-up in: one year with [name], MD or one of the following Advanced Practice Providers on your designated Care Team:   ?Tommye Standard, PA-C ?Legrand Como "Jonni Sanger" [place], PA-C ? ?Remote monitoring is used to monitor your Pacemaker from home. This monitoring reduces the number of office visits required to check your device to one time per year. It allows [place] to keep an eye on the functioning of your device to ensure it is working properly. You are scheduled for a device check from home on 01/15/2022. You may send your transmission at any time that day. If you have a wireless device, the transmission will be sent automatically. After your physician reviews your transmission, you will receive a postcard with your next transmission date. ? ?Any Other Special Instructions Will Be Listed Below (If Applicable). ? ?If you need a refill on your cardiac medications before your next appointment, please call your pharmacy.  ? ? ? ? ?

## 2022-01-02 NOTE — Telephone Encounter (Signed)
Sent [employer].  Also left detailed message advising of dates coming up for potential procedure. ? ?Requested communication back from Pt. ?

## 2022-01-02 NOTE — Telephone Encounter (Signed)
Pt scheduled for PPM gen change with RV lead extraction/replacement on April 18. ? ? ?

## 2022-01-15 ENCOUNTER — Ambulatory Visit (INDEPENDENT_AMBULATORY_CARE_PROVIDER_SITE_OTHER): Payer: Self-pay

## 2022-01-15 DIAGNOSIS — I441 Atrioventricular block, second degree: Secondary | ICD-10-CM

## 2022-01-16 LAB — CUP PACEART REMOTE DEVICE CHECK
Battery Remaining Longevity: 2 mo
Battery Voltage: 2.75 V
Brady Statistic AP VP Percent: 0.31 %
Brady Statistic AP VS Percent: 0 %
Brady Statistic AS VP Percent: 99.67 %
Brady Statistic AS VS Percent: 0.02 %
Brady Statistic RA Percent Paced: 0.31 %
Brady Statistic RV Percent Paced: 99.98 %
Date Time Interrogation Session: 20230320000100
Implantable Lead Implant Date: 20180220
Implantable Lead Implant Date: 20180220
Implantable Lead Location: 753859
Implantable Lead Location: 753860
Implantable Lead Model: 3830
Implantable Lead Model: 5076
Implantable Pulse Generator Implant Date: 20180220
Lead Channel Impedance Value: 304 Ohm
Lead Channel Impedance Value: 323 Ohm
Lead Channel Impedance Value: 361 Ohm
Lead Channel Impedance Value: 418 Ohm
Lead Channel Pacing Threshold Amplitude: 0.5 V
Lead Channel Pacing Threshold Amplitude: 2.5 V
Lead Channel Pacing Threshold Pulse Width: 0.4 ms
Lead Channel Pacing Threshold Pulse Width: 0.4 ms
Lead Channel Sensing Intrinsic Amplitude: 3.375 mV
Lead Channel Sensing Intrinsic Amplitude: 3.375 mV
Lead Channel Sensing Intrinsic Amplitude: 6 mV
Lead Channel Sensing Intrinsic Amplitude: 6 mV
Lead Channel Setting Pacing Amplitude: 1.5 V
Lead Channel Setting Pacing Amplitude: 6 V
Lead Channel Setting Pacing Pulse Width: 1 ms
Lead Channel Setting Sensing Sensitivity: 4 mV

## 2022-01-22 NOTE — Telephone Encounter (Signed)
Confirmed extraction case date/time. ? ?Instruction letter completed and sent via mychart. ? ?Pt scheduled for lab work April 5. ? ?Work up complete ?

## 2022-01-30 NOTE — Progress Notes (Signed)
Remote pacemaker transmission.   

## 2022-01-31 ENCOUNTER — Other Ambulatory Visit: Payer: 59

## 2022-02-02 NOTE — Addendum Note (Signed)
Addended by: Douglass Rivers D on: 02/02/2022 02:17 PM ? ? Modules accepted: Level of Service ? ?

## 2022-02-05 ENCOUNTER — Telehealth: Payer: Self-pay

## 2022-02-12 ENCOUNTER — Encounter (HOSPITAL_COMMUNITY): Payer: Self-pay | Admitting: Internal Medicine

## 2022-02-12 ENCOUNTER — Other Ambulatory Visit: Payer: Self-pay

## 2022-02-12 NOTE — Pre-Procedure Instructions (Signed)
?[address], [address] ?[place] [address] ?Phone: [phone] Fax: [phone] ? ? ?PCP - Dr. Antony Contras  ?Cardiologist - Dr. Crissie Sickles ? ?PPM/ICD - Medtronic ?Device Orders - N/A ?Rep Notified Leanna Sato emailed on 02/11/22 ? ?EKG - 12/29/21 ?ECHO - 12/14/16 ? ?CPAP - Will bring machine ? ?ERAS Protcol - NPO ? ?COVID TEST- DOS ? ?Anesthesia review: Y ? ?Patient verbally denies any shortness of breath, fever, cough and chest pain during phone call ? ? ?-------------  SDW INSTRUCTIONS given: ? ?Your procedure is scheduled on 02/13/22. ? Report to [facility]" at [facility], and check in at the Admitting office. ? Call this number if you have problems the morning of surgery: ? [phone] ? ? Remember: ? Do not eat after midnight the night before your surgery ?  ? Take these medicines the morning of surgery with A SIP OF WATER  ?amLODipine (NORVASC)  ?losartan (COZAAR)  ? ? ?As of today, STOP taking any Aspirin (unless otherwise instructed by your surgeon) Aleve, Naproxen, Ibuprofen, Motrin, Advil, Goody's, BC's, all herbal medications, fish oil, and all vitamins. ? ?         ?           Do not wear jewelry, make up, or nail polish ?           Do not wear lotions, powders, perfumes/colognes, or deodorant. ?           Do not shave 48 hours prior to surgery.  Men may shave face and neck. ?           Do not bring valuables to the hospital. ?           [facility] is not responsible for any belongings or valuables. ? ?Do NOT Smoke (Tobacco/Vaping) 24 hours prior to your procedure ?If you use a CPAP at night, you may bring all equipment for your overnight stay. ?  ?Contacts, glasses, dentures or bridgework may not be worn into surgery.    ?  ?For patients admitted to the hospital, discharge time will be determined by your treatment team. ?  ?Patients discharged the day of surgery will not be allowed to drive home, and someone  needs to stay with them for 24 hours. ? ? ? ?Special instructions:   ?[facility]- Preparing For Surgery ? ?Before surgery, you can play an important role. Because skin is not sterile, your skin needs to be as free of germs as possible. You can reduce the number of germs on your skin by washing with CHG (chlorahexidine gluconate) Soap before surgery.  CHG is an antiseptic cleaner which kills germs and bonds with the skin to continue killing germs even after washing.   ? ?Oral Hygiene is also important to reduce your risk of infection.  Remember - BRUSH YOUR TEETH THE MORNING OF SURGERY WITH YOUR REGULAR TOOTHPASTE ? ?Please do not use if you have an allergy to CHG or antibacterial soaps. If your skin becomes reddened/irritated stop using the CHG.  ?Do not shave (including legs and underarms) for at least 48 hours prior to first CHG shower. It is OK to shave your face. ? ?Please follow these instructions carefully. ?  ?Shower the NIGHT BEFORE SURGERY and the MORNING OF SURGERY with DIAL Soap.  ? ?Pat yourself dry with a CLEAN TOWEL. ? ?Wear CLEAN PAJAMAS to bed the night before  surgery ? ?Place CLEAN SHEETS on your bed the night of your first shower and DO NOT SLEEP WITH PETS. ? ? ?Day of Surgery: ?Please shower morning of surgery  ?Wear Clean/Comfortable clothing the morning of surgery ?Do not apply any deodorants/lotions.   ?Remember to brush your teeth WITH YOUR REGULAR TOOTHPASTE. ?  ?Questions were answered. Patient verbalized understanding of instructions.  ? ? ?   ? ?

## 2022-02-13 ENCOUNTER — Inpatient Hospital Stay (HOSPITAL_COMMUNITY): Payer: 59

## 2022-02-13 ENCOUNTER — Encounter (HOSPITAL_COMMUNITY): Payer: Self-pay | Admitting: Internal Medicine

## 2022-02-13 ENCOUNTER — Encounter (HOSPITAL_COMMUNITY)
Admission: RE | Disposition: A | Discharge status: Home / Self Care | Payer: 59 | Source: Home / Self Care | Attending: Internal Medicine

## 2022-02-13 ENCOUNTER — Inpatient Hospital Stay (HOSPITAL_COMMUNITY): Payer: 59 | Admitting: Physician Assistant

## 2022-02-13 ENCOUNTER — Inpatient Hospital Stay (HOSPITAL_COMMUNITY)
Admission: RE | Admit: 2022-02-13 | Discharge: 2022-02-14 | DRG: 243 | Disposition: A | Discharge status: Home / Self Care | Payer: 59 | Attending: Internal Medicine | Admitting: Internal Medicine

## 2022-02-13 ENCOUNTER — Other Ambulatory Visit: Payer: Self-pay

## 2022-02-13 DIAGNOSIS — Z87891 Personal history of nicotine dependence: Secondary | ICD-10-CM

## 2022-02-13 DIAGNOSIS — Z6841 Body Mass Index (BMI) 40.0 and over, adult: Secondary | ICD-10-CM

## 2022-02-13 DIAGNOSIS — Z79899 Other long term (current) drug therapy: Secondary | ICD-10-CM | POA: Diagnosis not present

## 2022-02-13 DIAGNOSIS — T82110A Breakdown (mechanical) of cardiac electrode, initial encounter: Principal | ICD-10-CM | POA: Diagnosis present

## 2022-02-13 DIAGNOSIS — I442 Atrioventricular block, complete: Secondary | ICD-10-CM | POA: Diagnosis present

## 2022-02-13 DIAGNOSIS — T82118A Breakdown (mechanical) of other cardiac electronic device, initial encounter: Secondary | ICD-10-CM

## 2022-02-13 DIAGNOSIS — I10 Essential (primary) hypertension: Secondary | ICD-10-CM | POA: Diagnosis not present

## 2022-02-13 DIAGNOSIS — Y838 Other surgical procedures as the cause of abnormal reaction of the patient, or of later complication, without mention of misadventure at the time of the procedure: Secondary | ICD-10-CM | POA: Diagnosis present

## 2022-02-13 DIAGNOSIS — I110 Hypertensive heart disease with heart failure: Secondary | ICD-10-CM | POA: Diagnosis not present

## 2022-02-13 DIAGNOSIS — T82110S Breakdown (mechanical) of cardiac electrode, sequela: Principal | ICD-10-CM

## 2022-02-13 DIAGNOSIS — J189 Pneumonia, unspecified organism: Secondary | ICD-10-CM

## 2022-02-13 DIAGNOSIS — Z45010 Encounter for checking and testing of cardiac pacemaker pulse generator [battery]: Secondary | ICD-10-CM

## 2022-02-13 DIAGNOSIS — Z20822 Contact with and (suspected) exposure to covid-19: Secondary | ICD-10-CM | POA: Diagnosis present

## 2022-02-13 DIAGNOSIS — Z713 Dietary counseling and surveillance: Secondary | ICD-10-CM | POA: Diagnosis not present

## 2022-02-13 DIAGNOSIS — I5032 Chronic diastolic (congestive) heart failure: Secondary | ICD-10-CM | POA: Diagnosis present

## 2022-02-13 DIAGNOSIS — G4733 Obstructive sleep apnea (adult) (pediatric): Secondary | ICD-10-CM | POA: Diagnosis not present

## 2022-02-13 HISTORY — PX: PACEMAKER LEAD REMOVAL: SHX5064

## 2022-02-13 LAB — SARS CORONAVIRUS 2 BY RT PCR (HOSPITAL ORDER, PERFORMED IN ~~LOC~~ HOSPITAL LAB): SARS Coronavirus 2: NEGATIVE

## 2022-02-13 LAB — ABO/RH: ABO/RH(D): O POS

## 2022-02-13 LAB — PREPARE RBC (CROSSMATCH)

## 2022-02-13 SURGERY — REMOVAL, ELECTRODE LEAD, CARDIAC PACEMAKER, WITHOUT REPLACEMENT
Anesthesia: General | Site: Chest

## 2022-02-13 MED ORDER — LIDOCAINE 2% (20 MG/ML) 5 ML SYRINGE
INTRAMUSCULAR | Status: DC | PRN
  Administered 2022-02-13: 100 mg via INTRAVENOUS

## 2022-02-13 MED ORDER — LACTATED RINGERS IV SOLN
INTRAVENOUS | Status: DC
Start: 2022-02-13 — End: 2022-02-14

## 2022-02-13 MED ORDER — PHENYLEPHRINE HCL-NACL 20-0.9 MG/250ML-% IV SOLN
INTRAVENOUS | Status: DC | PRN
  Administered 2022-02-13: 25 ug/min via INTRAVENOUS

## 2022-02-13 MED ORDER — POVIDONE-IODINE 10 % EX SWAB
2.0000 | Freq: Once | CUTANEOUS | Status: AC
Start: 2022-02-13 — End: 2022-02-13
  Administered 2022-02-13: 2 via TOPICAL

## 2022-02-13 MED ORDER — ACETAMINOPHEN 325 MG PO TABS: 325.0000 mg | ORAL_TABLET | ORAL | Status: DC | PRN

## 2022-02-13 MED ORDER — ONDANSETRON HCL 4 MG/2ML IJ SOLN: 4.0000 mg | Freq: Four times a day (QID) | INTRAMUSCULAR | Status: DC | PRN

## 2022-02-13 MED ORDER — MIDAZOLAM HCL 2 MG/2ML IJ SOLN
INTRAMUSCULAR | Status: AC
  Filled 2022-02-13: qty 2

## 2022-02-13 MED ORDER — SUGAMMADEX SODIUM 200 MG/2ML IV SOLN
INTRAVENOUS | Status: DC | PRN
  Administered 2022-02-13: 200 mg via INTRAVENOUS

## 2022-02-13 MED ORDER — CHLORHEXIDINE GLUCONATE 4 % EX LIQD: 4.0000 "application " | Freq: Once | CUTANEOUS | Status: DC

## 2022-02-13 MED ORDER — FENTANYL CITRATE (PF) 100 MCG/2ML IJ SOLN
INTRAMUSCULAR | Status: AC
  Filled 2022-02-13: qty 2

## 2022-02-13 MED ORDER — PHENYLEPHRINE 80 MCG/ML (10ML) SYRINGE FOR IV PUSH (FOR BLOOD PRESSURE SUPPORT)
PREFILLED_SYRINGE | INTRAVENOUS | Status: DC | PRN
  Administered 2022-02-13: 40 ug via INTRAVENOUS

## 2022-02-13 MED ORDER — SODIUM CHLORIDE 0.9 % IV SOLN: INTRAVENOUS | Status: DC

## 2022-02-13 MED ORDER — FENTANYL CITRATE (PF) 250 MCG/5ML IJ SOLN
INTRAMUSCULAR | Status: AC
  Filled 2022-02-13: qty 5

## 2022-02-13 MED ORDER — SODIUM CHLORIDE 0.9 % IV SOLN: 80.0000 mg | INTRAVENOUS | Status: DC

## 2022-02-13 MED ORDER — CEFAZOLIN SODIUM-DEXTROSE 2-4 GM/100ML-% IV SOLN
2.0000 g | Freq: Four times a day (QID) | INTRAVENOUS | Status: DC
  Administered 2022-02-13 – 2022-02-14 (×2): 2 g via INTRAVENOUS
  Filled 2022-02-13 (×3): qty 100

## 2022-02-13 MED ORDER — MIDAZOLAM HCL 2 MG/2ML IJ SOLN
INTRAMUSCULAR | Status: DC | PRN
  Administered 2022-02-13: 2 mg via INTRAVENOUS

## 2022-02-13 MED ORDER — PROPOFOL 10 MG/ML IV BOLUS
INTRAVENOUS | Status: AC
  Filled 2022-02-13: qty 20

## 2022-02-13 MED ORDER — AMLODIPINE BESYLATE 10 MG PO TABS: 10.0000 mg | ORAL_TABLET | Freq: Every day | ORAL | Status: DC

## 2022-02-13 MED ORDER — GENTAMICIN SULFATE 40 MG/ML IJ SOLN
INTRAMUSCULAR | Status: DC | PRN
  Administered 2022-02-13: 80 mg

## 2022-02-13 MED ORDER — CHLORHEXIDINE GLUCONATE 0.12 % MT SOLN
OROMUCOSAL | Status: AC
  Administered 2022-02-13: 15 mL
  Filled 2022-02-13: qty 15

## 2022-02-13 MED ORDER — ONDANSETRON HCL 4 MG/2ML IJ SOLN
INTRAMUSCULAR | Status: DC | PRN
  Administered 2022-02-13: 4 mg via INTRAVENOUS

## 2022-02-13 MED ORDER — CEFAZOLIN IN SODIUM CHLORIDE 3-0.9 GM/100ML-% IV SOLN
3.0000 g | INTRAVENOUS | Status: AC
  Administered 2022-02-13: 3 g via INTRAVENOUS
  Filled 2022-02-13: qty 100

## 2022-02-13 MED ORDER — SODIUM CHLORIDE 0.9 % IV SOLN
INTRAVENOUS | Status: AC
  Filled 2022-02-13: qty 2

## 2022-02-13 MED ORDER — SODIUM CHLORIDE 0.9% IV SOLUTION: Freq: Once | INTRAVENOUS | Status: DC

## 2022-02-13 MED ORDER — ROCURONIUM BROMIDE 10 MG/ML (PF) SYRINGE
PREFILLED_SYRINGE | INTRAVENOUS | Status: DC | PRN
  Administered 2022-02-13: 60 mg via INTRAVENOUS

## 2022-02-13 MED ORDER — FENTANYL CITRATE (PF) 250 MCG/5ML IJ SOLN
INTRAMUSCULAR | Status: DC | PRN
  Administered 2022-02-13: 100 ug via INTRAVENOUS
  Administered 2022-02-13 (×2): 25 ug via INTRAVENOUS

## 2022-02-13 MED ORDER — FENTANYL CITRATE (PF) 100 MCG/2ML IJ SOLN
25.0000 ug | INTRAMUSCULAR | Status: DC | PRN
  Administered 2022-02-13: 50 ug via INTRAVENOUS
  Filled 2022-02-13: qty 2

## 2022-02-13 MED ORDER — PROPOFOL 10 MG/ML IV BOLUS
INTRAVENOUS | Status: DC | PRN
  Administered 2022-02-13: 200 mg via INTRAVENOUS

## 2022-02-13 SURGICAL SUPPLY — 42 items
BAG BANDED W/RUBBER/TAPE 36X54 (MISCELLANEOUS) ×2 IMPLANT
BAG COUNTER SPONGE SURGICOUNT (BAG) ×2 IMPLANT
BLADE OSCILLATING /SAGITTAL (BLADE) IMPLANT
BLADE STERNUM SYSTEM 6 (BLADE) ×2 IMPLANT
BLADE SURG 10 STRL SS (BLADE) ×1 IMPLANT
BNDG COHESIVE 4X5 WHT NS (GAUZE/BANDAGES/DRESSINGS) IMPLANT
CABLE SURG 12 DISP A/V CHANNEL (MISCELLANEOUS) ×1 IMPLANT
CANISTER SUCT 3000ML PPV (MISCELLANEOUS) ×2 IMPLANT
COVER BACK TABLE 60X90IN (DRAPES) ×2 IMPLANT
DRAPE CARDIOVASCULAR INCISE (DRAPES) ×2
DRAPE SRG 135X102X78XABS (DRAPES) ×1 IMPLANT
DRSG OPSITE 6X11 MED (GAUZE/BANDAGES/DRESSINGS) IMPLANT
DRSG TEGADERM 4X4.75 (GAUZE/BANDAGES/DRESSINGS) ×2 IMPLANT
ELECT REM PT RETURN 9FT ADLT (ELECTROSURGICAL) ×4
ELECTRODE REM PT RTRN 9FT ADLT (ELECTROSURGICAL) ×2 IMPLANT
GAUZE 4X4 16PLY ~~LOC~~+RFID DBL (SPONGE) IMPLANT
GAUZE SPONGE 4X4 12PLY STRL (GAUZE/BANDAGES/DRESSINGS) ×1 IMPLANT
GLOVE BIOGEL PI IND STRL 7.5 (GLOVE) ×1 IMPLANT
GLOVE BIOGEL PI INDICATOR 7.5 (GLOVE) ×1
GLOVE ECLIPSE 8.0 STRL XLNG CF (GLOVE) ×2 IMPLANT
GOWN STRL REUS W/ TWL LRG LVL3 (GOWN DISPOSABLE) IMPLANT
GOWN STRL REUS W/ TWL XL LVL3 (GOWN DISPOSABLE) ×1 IMPLANT
GOWN STRL REUS W/TWL LRG LVL3 (GOWN DISPOSABLE)
GOWN STRL REUS W/TWL XL LVL3 (GOWN DISPOSABLE) ×2
IPG PACE AZUR XT DR MRI W1DR01 (Pacemaker) IMPLANT
KIT TURNOVER KIT B (KITS) ×2 IMPLANT
KIT WRENCH (KITS) ×1 IMPLANT
LEAD CAPSURE NOVUS 5076-58CM (Lead) ×1 IMPLANT
PACE AZURE XT DR MRI W1DR01 (Pacemaker) ×2 IMPLANT
PAD ARMBOARD 7.5X6 YLW CONV (MISCELLANEOUS) ×4 IMPLANT
PAD ELECT DEFIB RADIOL ZOLL (MISCELLANEOUS) ×2 IMPLANT
POUCH AIGIS-R ANTIBACT PPM (Mesh General) ×2 IMPLANT
POUCH AIGIS-R ANTIBACT PPM MED (Mesh General) IMPLANT
SHEATH EVOLUTION SHORTIE RL 9F (SHEATH) ×1 IMPLANT
SLING SWATHE LG 5X54 UNIV (SOFTGOODS) ×1 IMPLANT
SUT PROLENE 2 0 SH DA (SUTURE) IMPLANT
TAPE CLOTH SURG 4X10 WHT LF (GAUZE/BANDAGES/DRESSINGS) ×1 IMPLANT
TOWEL GREEN STERILE (TOWEL DISPOSABLE) ×4 IMPLANT
TOWEL GREEN STERILE FF (TOWEL DISPOSABLE) ×4 IMPLANT
TRAY FOLEY SLVR 16FR TEMP STAT (SET/KITS/TRAYS/PACK) ×2 IMPLANT
TUBE CONNECTING 12X1/4 (SUCTIONS) IMPLANT
YANKAUER SUCT BULB TIP NO VENT (SUCTIONS) IMPLANT

## 2022-02-13 NOTE — Anesthesia Procedure Notes (Signed)
Arterial Line Insertion ?Start/End4/18/2023 1:25 PM, 02/13/2022 1:30 PM ?Performed by: Belinda Block, MD, Vonna Drafts, CRNA, CRNA ? Patient location: Pre-op. ?Preanesthetic checklist: patient identified, IV checked, site marked, risks and benefits discussed, surgical consent, monitors and equipment checked, pre-op evaluation, timeout performed and anesthesia consent ?Lidocaine 1% used for infiltration ?Right, radial was placed ?Catheter size: 20 G ?Hand hygiene performed  and maximum sterile barriers used  ? ?Attempts: 1 ?Procedure performed without using ultrasound guided technique. ?Following insertion, dressing applied and Biopatch. ?Post procedure assessment: normal ? ?Patient tolerated the procedure well with no immediate complications. ? ? ?

## 2022-02-13 NOTE — Anesthesia Preprocedure Evaluation (Addendum)
Anesthesia Evaluation  ?Patient identified by MRN, date of birth, ID band ?Patient awake ? ? ? ?Reviewed: ?Allergy & Precautions, NPO status , Patient's Chart, lab work & pertinent test results ? ?Airway ?Mallampati: II ? ?TM Distance: >3 FB ? ? ? ? Dental ?  ?Pulmonary ?sleep apnea , pneumonia,  ?  ?breath sounds clear to auscultation ? ? ? ? ? ? Cardiovascular ?hypertension, + dysrhythmias + pacemaker  ?Rhythm:Regular Rate:Normal ? ? ?  ?Neuro/Psych ? Headaches,   ? GI/Hepatic ?negative GI ROS, Neg liver ROS,   ?Endo/Other  ?negative endocrine ROS ? Renal/GU ?negative Renal ROS  ? ?  ?Musculoskeletal ? ? Abdominal ?  ?Peds ? Hematology ?  ?Anesthesia Other Findings ? ? Reproductive/Obstetrics ? ?  ? ? ? ? ? ? ? ? ? ? ? ? ? ?  ?  ? ? ? ? ? ? ? ? ?Anesthesia Physical ?Anesthesia Plan ? ?ASA: 3 ? ?Anesthesia Plan: General  ? ?Post-op Pain Management:   ? ?Induction: Intravenous ? ?PONV Risk Score and Plan: 2 and Ondansetron and Treatment may vary due to age or medical condition ? ?Airway Management Planned: Oral ETT and Video Laryngoscope Planned ? ?Additional Equipment:  ? ?Intra-op Plan:  ? ?Post-operative Plan: Possible Post-op intubation/ventilation ? ?Informed Consent: I have reviewed the patients History and Physical, chart, labs and discussed the procedure including the risks, benefits and alternatives for the proposed anesthesia with the patient or authorized representative who has indicated his/her understanding and acceptance.  ? ? ? ?Dental advisory given ? ?Plan Discussed with: CRNA and Anesthesiologist ? ?Anesthesia Plan Comments:   ? ? ? ? ? ?Anesthesia Quick Evaluation ? ?

## 2022-02-13 NOTE — Anesthesia Postprocedure Evaluation (Signed)
Anesthesia Post Note ? ?Patient: Juan Hanson ? ?Procedure(s) Performed: PACEMAKER LEAD REMOVAL, PACEMAKER LEAD INCERTION, PACEMAKER GENERATOR CHANGE (Chest) ? ?  ? ?Patient location during evaluation: PACU ?Anesthesia Type: General ?Level of consciousness: awake and alert ?Pain management: pain level controlled ?Vital Signs Assessment: post-procedure vital signs reviewed and stable ?Respiratory status: spontaneous breathing, nonlabored ventilation, respiratory function stable and patient connected to nasal cannula oxygen ?Cardiovascular status: blood pressure returned to baseline and stable ?Postop Assessment: no apparent nausea or vomiting ?Anesthetic complications: no ? ? ?No notable events documented. ? ?Last Vitals:  ?Vitals:  ? 02/13/22 2030 02/13/22 2048  ?BP: 133/80 (!) 148/83  ?Pulse: 61 (!) 57  ?Resp: 18 14  ?Temp: 36.8 ?C 36.5 ?C  ?SpO2: 95% 93%  ?  ?Last Pain:  ?Vitals:  ? 02/13/22 2048  ?TempSrc: Oral  ?PainSc:   ? ? ?  ?  ?  ?  ?  ?  ? ?Anadalay Macdonell ? ? ? ? ?

## 2022-02-13 NOTE — Op Note (Signed)
EP procedure note ? ?Procedure performed: Extraction of a previously implanted right ventricular pacing lead which had failed due to exit block and insertion of a new active-fixation pacing lead with removal of a previously implanted dual-chamber pacemaker which had reached elective replacement, and insertion of a new dual-chamber pacemaker ? ?Preoperative diagnosis: Complete heart block status post pacemaker insertion with failure of the right ventricular pacing lead secondary to high pacing thresholds.  The patient has developed ERI. ? ?Postoperative diagnosis: Same as preoperative diagnosis ? ?Description of the procedure: After informed consent was obtained, the patient was taken to the operating room in the fasting state.  The anesthesia service was utilized to provide general endotracheal anesthesia and invasive arterial hemodynamic monitoring.  After the usual preparation and draping, attention was first turned to the placement of a right femoral venous line which was carried out without difficulty.  At this point the decision was made to place a temporary pacing wire through the right femoral vein.  This was carried out without difficulty.  The pacing threshold was less than a volt.  There was satisfactory sensing. ? ?Attention was then turned to the pacemaker.  A 6 cm incision was carried out over the left infraclavicular region.  Electrocautery was utilized to dissect down to the pacemaker generator.  A combination of electrocautery and blunt dissection was utilized to free up the dense fibrous scar tissue around the pacing leads.  It should be noted that in doing so the patient's ventricular lead quit working.  The temporary pacing lead however works satisfactorily.  After the leads were freed up of the dense fibrous scar tissue the atrial lead was evaluated and found to be working satisfactorily.  There was no capture in the old ventricular lead.  The left subclavian vein was then punctured and a  Medtronic 5076 active-fixation pacing lead, serial number [id number], was advanced into the right ventricle.  Paced R waves were 8.  The lead was actively fixed.  The pacing impedance was 800 ohms and the threshold was less than a volt at 0.5 ms.  The lead was secured to the fascia with silk suture.  The sewing sleeve was also secured with silk suture.  At this point attention was turned to removal of the fractured ventricular pacing lead.  The Medtronic 8030-lead was cut.  A silk suture was used to tie the proximal end of the lead.  The silk suture was fed through a Cook 9 French short RL extraction sheath and traction was placed on the lead.  A combination of traction and counterpressure was placed on the lead itself and the lead was removed in total with no hemodynamic sequelae.  The lead had been in place almost 5 years.  Hemostasis was assured without difficulty.  The pocket was irrigated with antibiotic irrigation and debridement appropriately.  The new Medtronic dual-chamber pacemaker, serial numberRNB [id number], was connected to the new right ventricular lead and the old atrial lead and the new pacemaker was enveloped in a Medtronic antibiotic pouch.  The pocket was irrigated with antibiotic irrigation.  Electrocautery was used to assure hemostasis.  The pocket was closed with 2 layers of Vicryl suture.  Benzoin and Steri-Strips were painted on the skin.  The temporary pacing wire which had previously been placed was removed.  The femoral venous sheath was removed and hemostasis assured.  The patient was returned to recovery room in stable condition. ? ?Complications: There were no immediate procedure complications ? ?Conclusion: Successful extraction  of a previously implanted right ventricular pacing lead which had developed pacemaker lead failure to capture requiring very high pacing outputs, removal of the previously implanted dual-chamber pacemaker which had reached elective replacement, and insertion of  a new dual-chamber pacemaker.  A temporary transvenous pacemaker was placed to assure backup pacing capability in this patient with complete heart block. ? ?[name], MD ?

## 2022-02-13 NOTE — Progress Notes (Signed)
Patients home CPAP inspected, plugged in,  and set up for patient at bedside. Patient able to place self on/off as needed.  ?

## 2022-02-13 NOTE — Transfer of Care (Signed)
Immediate Anesthesia Transfer of Care Note ? ?Patient: Juan Hanson ? ?Procedure(s) Performed: PACEMAKER LEAD REMOVAL, PACEMAKER LEAD INCERTION, PACEMAKER GENERATOR CHANGE (Chest) ? ?Patient Location: PACU ? ?Anesthesia Type:General ? ?Level of Consciousness: drowsy ? ?Airway & Oxygen Therapy: Patient Spontanous Breathing and Patient connected to face mask oxygen ? ?Post-op Assessment: Report given to RN and Post -op Vital signs reviewed and stable ? ?Post vital signs: Reviewed and stable ? ?Last Vitals:  ?Vitals Value Taken Time  ?BP    ?Temp    ?Pulse 59 02/13/22 1932  ?Resp 14 02/13/22 1932  ?SpO2 99 % 02/13/22 1932  ?Vitals shown include unvalidated device data. ? ?Last Pain:  ?Vitals:  ? 02/13/22 1300  ?TempSrc: Oral  ?PainSc: 0-No pain  ?   ? ?  ? ?Complications: No notable events documented. ?

## 2022-02-13 NOTE — H&P (Signed)
?  ?  ?  ?  ?HPI ?Mr. Swider returns today for followup. He is a pleasant 66 yo man with mobitz 2, second degree AV block and morbid obesity, s/p PPM insertion. l He denies chest pain. No dyspnea. He has mild peripheral edema but has been on norvasc. He admits to dietary indiscretion with sodium. His weight is up almost 20 lbs. ?No Known Allergies ?  ?  ?      ?Current Outpatient Medications  ?Medication Sig Dispense Refill  ? amLODipine (NORVASC) 10 MG tablet Take 10 mg by mouth daily.      ? hydrochlorothiazide (HYDRODIURIL) 25 MG tablet Take 1 tablet by mouth daily.      ? losartan (COZAAR) 100 MG tablet Take 1 tablet by mouth daily.      ?  ?No current facility-administered medications for this visit.  ?  ?  ?  ?    ?Past Medical History:  ?Diagnosis Date  ? AV block, 2nd degree    ?  /notes 12/18/2016  ? Headache    ?  "had them when I woke up before getting my CPAP; never had migraines" (12/18/2016)  ? HTN (hypertension)    ? OSA on CPAP    ? Pneumonia ~ 2012  ? Presence of permanent cardiac pacemaker    ? Right bundle branch block    ?  /notes 12/18/2016  ?  ?  ?ROS: ?  ? All systems reviewed and negative except as noted in the HPI. ?  ?  ?     ?Past Surgical History:  ?Procedure Laterality Date  ? COLONOSCOPY   X 2  ? INSERT / REPLACE / REMOVE PACEMAKER   12/18/2016  ? PACEMAKER IMPLANT N/A 12/18/2016  ?  Procedure: Pacemaker Implant;  Surgeon: Evans Lance, MD;  Location: [facility];  Service: Cardiovascular;  Laterality: N/A;  ?  ?  ?  ?     ?Family History  ?Problem Relation Age of Onset  ? Lung cancer Mother    ? Arthritis Mother    ? Colon cancer Father    ? Lung cancer Father    ? Healthy Brother    ?      x3  ? Healthy Sister    ?  ?  ?  ?Social History  ?  ?     ?Socioeconomic History  ? Marital status: Divorced  ?    Spouse name: Not on file  ? Number of children: 2  ? Years of education: Not on file  ? Highest education level: Not on file  ?Occupational History  ? Not on file  ?Tobacco Use  ?  Smoking status: Never  ? Smokeless tobacco: Former  ?    Types: Snuff  ?    Quit date: 06/11/2016  ?Vaping Use  ? Vaping Use: Never used  ?Substance and Sexual Activity  ? Alcohol use: Yes  ?    Alcohol/week: 0.0 standard drinks  ?    Comment: 12/18/2016 "<10 drinks/year"  ? Drug use: No  ? Sexual activity: Not Currently  ?Other Topics Concern  ? Not on file  ?Social History Narrative  ? Not on file  ?  ?Social Determinants of Health  ?  ?Financial Resource Strain: Not on file  ?Food Insecurity: Not on file  ?Transportation Needs: Not on file  ?Physical Activity: Not on file  ?Stress: Not on file  ?Social Connections: Not on file  ?Intimate Partner Violence: Not  on file  ?  ?  ?  ?BP 110/76   Pulse 84   Ht '6\' 4"'$  (1.93 m)   Wt (!) 354 lb (160.6 kg)   SpO2 95%   BMI 43.09 kg/m?  ?  ?Physical Exam: ?  ?Morbidly obese appearing NAD ?HEENT: Unremarkable ?Neck:  No JVD, no thyromegally ?Lymphatics:  No adenopathy ?Back:  No CVA tenderness ?Lungs:  Clear with no wheezes ?HEART:  Regular rate rhythm, no murmurs, no rubs, no clicks ?Abd:  soft, positive bowel sounds, no organomegally, no rebound, no guarding ?Ext:  2 plus pulses, no edema, no cyanosis, no clubbing ?Skin:  No rashes no nodules ?Neuro:  CN II through XII intact, motor grossly intact ?  ?EKG - NSR with ventricular pacing ?  ?DEVICE  ?Normal device function.  See PaceArt for details. Pacing thresholds are reduced in the RV ?  ?Assess/Plan:  ?1. Mobitz 2, second degree AV block - he is now in CHB.  ?2. PPM - he has chronically elevated. He is approaching ERI. We will get him in sooner as his outputs are increased. I have discussed the treatment options. I have recommended removal of the current lead and insertion of a new RV lead in the left bundle area rather than the His region. I have discussed the indications/risks/;benefits/goals/expectations and he would like to proceed. ?3. Obesity - we discussed the importance of weight loss.  ?4. Chronic diastolic  heart failure - Again I encouraged weight loss and a low sodium diet. He will continue his current meds. He will continue his current meds. ?  ?Salome Spotted. ?

## 2022-02-14 ENCOUNTER — Inpatient Hospital Stay (HOSPITAL_COMMUNITY): Payer: 59

## 2022-02-14 DIAGNOSIS — T82110S Breakdown (mechanical) of cardiac electrode, sequela: Secondary | ICD-10-CM

## 2022-02-14 MED ORDER — ACETAMINOPHEN 325 MG PO TABS
325.0000 mg | ORAL_TABLET | ORAL | Status: DC | PRN
Start: 2022-02-14 — End: 2024-06-02

## 2022-02-14 NOTE — TOC Progression Note (Signed)
Transition of Care (TOC) - Progression Note  ? ? ?Patient Details  ?Name: Juan Hanson ?MRN: [id number] ?Date of Birth: 1956/04/09 ? ?Transition of Care (TOC) CM/SW Contact  ?Angelita Ingles, RN ?Phone Number:[phone] ? ?02/14/2022, 10:05 AM ? ?Clinical Narrative:    ? ?Transition of Care (TOC) Screening Note ? ? ?Patient Details  ?Name: Juan Hanson ?Date of Birth: [birth year] ? ? ?Transition of Care (TOC) CM/SW Contact:    ?Angelita Ingles, RN ?Phone Number: ?02/14/2022, 10:05 AM ? ? ? ?Transition of Care Department [facility]) has reviewed patient and no TOC needs have been identified at this time. We will continue to monitor patient advancement through interdisciplinary progression rounds. If new patient transition needs arise, please place a TOC consult. ? ? ? ? ?  ?  ? ?Expected Discharge Plan and Services ?  ?  ?  ?  ?  ?Expected Discharge Date: 02/14/22               ?  ?  ?  ?  ?  ?  ?  ?  ?  ?  ? ? ?Social Determinants of Health (SDOH) Interventions ?  ? ?Readmission Risk Interventions ?   ? View : No data to display.  ?  ?  ?  ? ? ?

## 2022-02-14 NOTE — Discharge Summary (Addendum)
? ? ? ?ELECTROPHYSIOLOGY PROCEDURE DISCHARGE SUMMARY  ? ? ?Patient ID: Juan Hanson,  ?MRN: [id number], DOB/AGE: 02/20/1956 66 y.o. ? ?Admit date: 02/13/2022 ?Discharge date: 02/14/2022 ? ?Primary Care Physician: Antony Contras, MD  ?Primary Cardiologist: None  ?Electrophysiologist: Dr. Lovena Le ? ?Primary Discharge Diagnosis:  ?PPM lead malfunction ?Mobitz II bradycardia status post pacemaker implantation this admission ? ?Secondary Discharge Diagnosis:  ?Chronic diastolic CHF ? ?No Known Allergies ? ? ?Procedures This Admission:  ?1.  Implantation of a Medtronic dual chamber PPM on 02/13/2022 by Dr. Lovena Le. The patient received a new MDT Generator and new 5076 RV lead.  There were no immediate post procedure complications. ?2. Extraction of a HIS bundle MDT lead that had lost capture.  ?2.  CXR on 02/14/22 demonstrated no pneumothorax status post device implantation.  ? ?Brief HPI: ?Juan Hanson is a 66 y.o. male was followed electrophysiology in the outpatient setting for PPM that showed worsening threshold in the RV.  Past medical history includes above.  The patient had an RV lead with gradually increasing threshold and likely intermittent LOC.  Risks, benefits, and alternatives to PPM lead extraction and revision were reviewed with the patient who wished to proceed.  ? ?Hospital Course:  ?The patient was admitted and underwent extraction of an old MDT RV lead that had malfunction, and implantation of a new active fixation 5076 RV lead and generator change. See op note for further details. He was monitored on telemetry overnight which demonstrated appropriate pacing.  Left chest was without hematoma or ecchymosis.  The device was interrogated and found to be functioning normally.  CXR was obtained and demonstrated no pneumothorax status post device implantation.  Wound care, arm mobility, and restrictions were reviewed with the patient.  The patient was examined and considered stable for discharge to home.    ? ?Physical Exam: ?Vitals:  ? 02/13/22 2300 02/14/22 0005 02/14/22 0400 02/14/22 0744  ?BP: 128/71  124/69 114/69  ?Pulse: 62 60 60 66  ?Resp: 19 (!) '21 18 17  '$ ?Temp: 98 ?F (36.7 ?C)  98 ?F (36.7 ?C) 98.6 ?F (37 ?C)  ?TempSrc: Oral  Oral Oral  ?SpO2: 96% (!) 86% 92% 94%  ?Weight:      ?Height:      ? ? ?GEN- The patient is well appearing, alert and oriented x 3 today.   ?HEENT: normocephalic, atraumatic; sclera clear, conjunctiva pink; hearing intact; oropharynx clear; neck supple, no JVP ?Lymph- no cervical lymphadenopathy ?Lungs- Clear to ausculation bilaterally, normal work of breathing.  No wheezes, rales, rhonchi ?Heart- Regular rate and rhythm, no murmurs, rubs or gallops, PMI not laterally displaced ?GI- soft, non-tender, non-distended, bowel sounds present, no hepatosplenomegaly ?Extremities- no clubbing, cyanosis, or edema; DP/PT/radial pulses 2+ bilaterally ?MS- no significant deformity or atrophy ?Skin- warm and dry, no rash or lesion, left chest without hematoma/ecchymosis ?Psych- euthymic mood, full affect ?Neuro- strength and sensation are intact ? ? ?Labs: ?  ?Lab Results  ?Component Value Date  ? WBC 9.8 12/12/2016  ? HGB 15.9 12/12/2016  ? HCT 45.0 12/12/2016  ? MCV 81 12/12/2016  ? PLT 224 12/12/2016  ? No results for input(s): NA, K, CL, CO2, BUN, CREATININE, CALCIUM, PROT, BILITOT, ALKPHOS, ALT, AST, GLUCOSE in the last 168 hours. ? ?Invalid input(s): LABALBU ? ?Discharge Medications:  ?Allergies as of 02/14/2022   ?No Known Allergies ?  ? ?  ?Medication List  ?  ? ?TAKE these medications   ? ?acetaminophen 325 MG tablet ?Commonly known  as: TYLENOL ?Take 1-2 tablets (325-650 mg total) by mouth every 4 (four) hours as needed for mild pain. ?  ?amLODipine 10 MG tablet ?Commonly known as: NORVASC ?Take 10 mg by mouth daily. ?  ?Fish Oil 1000 MG Cpdr ?Take 1,000 mg by mouth daily. ?  ?hydrochlorothiazide 25 MG tablet ?Commonly known as: HYDRODIURIL ?Take 25 mg by mouth daily. ?  ?losartan 100 MG  tablet ?Commonly known as: COZAAR ?Take 100 mg by mouth daily. ?  ? ?  ? ? ?Disposition:  ? ? Follow-up Information   ? ? [facility].   ?Why: on 5/3 at noon for post PPM check ?Contact information: ?[address] ?[place] [address] ?[phone] ? ?  ?  ? ?  ?  ? ?  ? ? ?Duration of Discharge Encounter: Greater than 30 minutes including physician time. ? ?Signed, ?Shirley Friar, PA-C  ?02/14/2022 ?9:21 AM ? ?EP Attending ? ?Patient seen and examined. Agree with the findings as noted above. The patient is doing well after undergoing PM lead extraction and insertion of a new RV pacing lead along with PM gen change . DC home with usual followup as an outpatient. ? ?Juan Hanson ? ? ?

## 2022-02-14 NOTE — Discharge Instructions (Signed)
After Your Pacemaker ? ? ?You have a Medtronic Pacemaker ? ?ACTIVITY ?Do not lift your arm above shoulder height for 1 week after your procedure. After 7 days, you may progress as below.  ?You should remove your sling 24 hours after your procedure, unless otherwise instructed by your provider.  ? ? ? Wednesday February 21, 2022  Thursday February 22, 2022 Friday February 23, 2022 Saturday February 24, 2022  ? ?Do not lift, push, pull, or carry anything over 10 pounds with the affected arm until 6 weeks (Wednesday Mar 28, 2022 ) after your procedure.  ? ?You may drive AFTER your wound check, unless you have been told otherwise by your provider.  ? ?Ask your healthcare provider when you can go back to work ? ? ?INCISION/Dressing ?If you are on a blood thinner such as Coumadin, Xarelto, Eliquis, Plavix, or Pradaxa please confirm with your provider when this should be resumed.  ? ?If large square, outer bandage is left in place, this can be removed after 24 hours from your procedure. Do not remove steri-strips or glue as below.  ? ?Monitor your Pacemaker site for redness, swelling, and drainage. Call the device clinic at [phone] if you experience these symptoms or fever/chills. ? ?If your incision is sealed with Steri-strips or staples, you may shower 10 days after your procedure or when told by your provider. Do not remove the steri-strips or let the shower hit directly on your site. You may wash around your site with soap and water.   ? ?If you were discharged in a sling, please do not wear this during the day more than 48 hours after your surgery unless otherwise instructed. This may increase the risk of stiffness and soreness in your shoulder.  ? ?Avoid lotions, ointments, or perfumes over your incision until it is well-healed. ? ?You may use a hot tub or a pool AFTER your wound check appointment if the incision is completely closed. ? ?Pacemaker Alerts:  Some alerts are vibratory and others beep. These are NOT  emergencies. Please call our office to let us know. If this occurs at night or on weekends, it can wait until the next business day. Send a remote transmission. ? ?If your device is capable of reading fluid status (for heart failure), you will be offered monthly monitoring to review this with you.  ? ?DEVICE MANAGEMENT ?Remote monitoring is used to monitor your pacemaker from home. This monitoring is scheduled every 91 days by our office. It allows [place] to keep an eye on the functioning of your device to ensure it is working properly. You will routinely see your Electrophysiologist annually (more often if necessary).  ? ?You should receive your ID card for your new device in 4-8 weeks. Keep this card with you at all times once received. Consider wearing a medical alert bracelet or necklace. ? ?Your Pacemaker may be MRI compatible. This will be discussed at your next office visit/wound check.  You should avoid contact with strong electric or magnetic fields.  ? ?Do not use amateur (ham) radio equipment or electric (arc) welding torches. MP3 player headphones with magnets should not be used. Some devices are safe to use if held at least 12 inches (30 cm) from your Pacemaker. These include power tools, lawn mowers, and speakers. If you are unsure if something is safe to use, ask your health care provider. ? ?When using your cell phone, hold it to the ear that is on the opposite side from the  Pacemaker. Do not leave your cell phone in a pocket over the Pacemaker. ? ?You may safely use electric blankets, heating pads, computers, and microwave ovens. ? ?Call the office right away if: ?You have chest pain. ?You feel more short of breath than you have felt before. ?You feel more light-headed than you have felt before. ?Your incision starts to open up. ? ?This information is not intended to replace advice given to you by your health care provider. Make sure you discuss any questions you have with your health care provider.  ?

## 2022-02-14 NOTE — Progress Notes (Signed)
Discharge instructions provided to patient. No questions or concerns at this time.  ?

## 2022-02-15 ENCOUNTER — Encounter (HOSPITAL_COMMUNITY): Payer: Self-pay | Admitting: Internal Medicine

## 2022-02-15 LAB — TYPE AND SCREEN
ABO/RH(D): O POS
Antibody Screen: NEGATIVE
Unit division: 0
Unit division: 0

## 2022-02-15 LAB — BPAM RBC
Blood Product Expiration Date: 202305262359
Blood Product Expiration Date: 202305262359
Unit Type and Rh: 5100
Unit Type and Rh: 5100

## 2022-02-28 ENCOUNTER — Ambulatory Visit (INDEPENDENT_AMBULATORY_CARE_PROVIDER_SITE_OTHER): Payer: 59

## 2022-02-28 DIAGNOSIS — I441 Atrioventricular block, second degree: Secondary | ICD-10-CM | POA: Diagnosis not present

## 2022-02-28 NOTE — Patient Instructions (Signed)

## 2022-03-05 LAB — CUP PACEART INCLINIC DEVICE CHECK
Battery Remaining Longevity: 139 mo
Battery Voltage: 3.21 V
Brady Statistic AP VP Percent: 39.53 %
Brady Statistic AP VS Percent: 0.01 %
Brady Statistic AS VP Percent: 55.99 %
Brady Statistic AS VS Percent: 4.47 %
Brady Statistic RA Percent Paced: 42.91 %
Brady Statistic RV Percent Paced: 95.52 %
Date Time Interrogation Session: 20230503120400
Implantable Lead Implant Date: 20180220
Implantable Lead Implant Date: 20230418
Implantable Lead Location: 753859
Implantable Lead Location: 753860
Implantable Lead Model: 5076
Implantable Lead Model: 5076
Implantable Pulse Generator Implant Date: 20230418
Lead Channel Impedance Value: 342 Ohm
Lead Channel Impedance Value: 380 Ohm
Lead Channel Impedance Value: 399 Ohm
Lead Channel Impedance Value: 513 Ohm
Lead Channel Pacing Threshold Amplitude: 0.5 V
Lead Channel Pacing Threshold Amplitude: 0.875 V
Lead Channel Pacing Threshold Pulse Width: 0.4 ms
Lead Channel Pacing Threshold Pulse Width: 0.4 ms
Lead Channel Sensing Intrinsic Amplitude: 17.75 mV
Lead Channel Sensing Intrinsic Amplitude: 4.625 mV
Lead Channel Sensing Intrinsic Amplitude: 5.375 mV
Lead Channel Setting Pacing Amplitude: 1.5 V
Lead Channel Setting Pacing Amplitude: 3.5 V
Lead Channel Setting Pacing Pulse Width: 0.4 ms
Lead Channel Setting Sensing Sensitivity: 4 mV

## 2022-03-05 NOTE — Progress Notes (Signed)

## 2022-04-06 DIAGNOSIS — R972 Elevated prostate specific antigen [PSA]: Secondary | ICD-10-CM | POA: Diagnosis not present

## 2022-04-13 DIAGNOSIS — R972 Elevated prostate specific antigen [PSA]: Secondary | ICD-10-CM | POA: Diagnosis not present

## 2022-04-13 DIAGNOSIS — N400 Enlarged prostate without lower urinary tract symptoms: Secondary | ICD-10-CM | POA: Diagnosis not present

## 2022-05-17 ENCOUNTER — Ambulatory Visit (INDEPENDENT_AMBULATORY_CARE_PROVIDER_SITE_OTHER): Payer: Medicare Other

## 2022-05-17 DIAGNOSIS — I441 Atrioventricular block, second degree: Secondary | ICD-10-CM | POA: Diagnosis not present

## 2022-05-17 LAB — CUP PACEART REMOTE DEVICE CHECK
Battery Remaining Longevity: 150 mo
Battery Voltage: 3.19 V
Brady Statistic AP VP Percent: 21.88 %
Brady Statistic AP VS Percent: 0 %
Brady Statistic AS VP Percent: 76.63 %
Brady Statistic AS VS Percent: 1.49 %
Brady Statistic RA Percent Paced: 22.92 %
Brady Statistic RV Percent Paced: 98.51 %
Date Time Interrogation Session: 20230719200515
Implantable Lead Implant Date: 20180220
Implantable Lead Implant Date: 20230418
Implantable Lead Location: 753859
Implantable Lead Location: 753860
Implantable Lead Model: 5076
Implantable Lead Model: 5076
Implantable Pulse Generator Implant Date: 20230418
Lead Channel Impedance Value: 304 Ohm
Lead Channel Impedance Value: 361 Ohm
Lead Channel Impedance Value: 418 Ohm
Lead Channel Impedance Value: 532 Ohm
Lead Channel Pacing Threshold Amplitude: 0.5 V
Lead Channel Pacing Threshold Amplitude: 0.875 V
Lead Channel Pacing Threshold Pulse Width: 0.4 ms
Lead Channel Pacing Threshold Pulse Width: 0.4 ms
Lead Channel Sensing Intrinsic Amplitude: 17.75 mV
Lead Channel Sensing Intrinsic Amplitude: 2.875 mV
Lead Channel Sensing Intrinsic Amplitude: 2.875 mV
Lead Channel Setting Pacing Amplitude: 1.5 V
Lead Channel Setting Pacing Amplitude: 2 V
Lead Channel Setting Pacing Pulse Width: 0.4 ms
Lead Channel Setting Sensing Sensitivity: 4 mV

## 2022-05-21 DIAGNOSIS — G4733 Obstructive sleep apnea (adult) (pediatric): Secondary | ICD-10-CM | POA: Diagnosis not present

## 2022-05-22 DIAGNOSIS — Z23 Encounter for immunization: Secondary | ICD-10-CM | POA: Diagnosis not present

## 2022-05-29 ENCOUNTER — Ambulatory Visit: Payer: Medicare Other | Admitting: Internal Medicine

## 2022-05-29 ENCOUNTER — Encounter: Payer: Self-pay | Admitting: Internal Medicine

## 2022-05-29 VITALS — BP 119/78 | HR 91 | Ht 76.0 in | Wt 355.0 lb

## 2022-05-29 DIAGNOSIS — Z950 Presence of cardiac pacemaker: Secondary | ICD-10-CM

## 2022-05-29 DIAGNOSIS — I441 Atrioventricular block, second degree: Secondary | ICD-10-CM | POA: Diagnosis not present

## 2022-05-29 NOTE — Progress Notes (Signed)
HPI Juan Hanson returns today for followup. He is a pleasant 66 yo man with mobitz 2, second degree AV block which progressed to CHB, and morbid obesity, s/p PPM insertion. He developed PM lead disfunction and exit block and underwent extraction and insertion of a new ventricular pacing lead. He denies chest pain. No dyspnea. He has mild peripheral edema but has been on norvasc. He admits to dietary indiscretion with sodium. His weight remains elevated. No Known Allergies   Current Outpatient Medications  Medication Sig Dispense Refill   acetaminophen (TYLENOL) 325 MG tablet Take 1-2 tablets (325-650 mg total) by mouth every 4 (four) hours as needed for mild pain.     amLODipine (NORVASC) 10 MG tablet Take 10 mg by mouth daily.     hydrochlorothiazide (HYDRODIURIL) 25 MG tablet Take 25 mg by mouth daily.     losartan (COZAAR) 100 MG tablet Take 100 mg by mouth daily.     Omega-3 Fatty Acids (FISH OIL) 1000 MG CPDR Take 1,000 mg by mouth daily.     No current facility-administered medications for this visit.     Past Medical History:  Diagnosis Date   AV block, 2nd degree    /notes 12/18/2016   Headache    "had them when I woke up before getting my CPAP; never had migraines" (12/18/2016)   HTN (hypertension)    OSA on CPAP    Pneumonia ~ 2012   Presence of permanent cardiac pacemaker    Right bundle branch block    /notes 12/18/2016    ROS:   All systems reviewed and negative except as noted in the HPI.   Past Surgical History:  Procedure Laterality Date   COLONOSCOPY  X 2   INSERT / REPLACE / REMOVE PACEMAKER  12/18/2016   PACEMAKER IMPLANT N/A 12/18/2016   Procedure: Pacemaker Implant;  Surgeon: Evans Lance, MD;  Location: [facility];  Service: Cardiovascular;  Laterality: N/A;   PACEMAKER LEAD REMOVAL N/A 02/13/2022   Procedure: PACEMAKER LEAD REMOVAL, PACEMAKER LEAD INCERTION, PACEMAKER GENERATOR CHANGE;  Surgeon: Evans Lance, MD;  Location: [facility] OR;   Service: Cardiovascular;  Laterality: N/A;     Family History  Problem Relation Age of Onset   Lung cancer Mother    Arthritis Mother    Colon cancer Father    Lung cancer Father    Healthy Brother        x3   Healthy Sister      Social History   Socioeconomic History   Marital status: Divorced    Spouse name: Not on file   Number of children: 2   Years of education: Not on file   Highest education level: Not on file  Occupational History   Not on file  Tobacco Use   Smoking status: Never   Smokeless tobacco: Former    Types: Snuff    Quit date: 06/11/2016  Vaping Use   Vaping Use: Never used  Substance and Sexual Activity   Alcohol use: Yes    Alcohol/week: 0.0 standard drinks of alcohol    Comment: 12/18/2016 "<10 drinks/year"   Drug use: No   Sexual activity: Not Currently  Other Topics Concern   Not on file  Social History Narrative   Not on file   Social Determinants of Health   Financial Resource Strain: Not on file  Food Insecurity: Not on file  Transportation Needs: Not on file  Physical Activity: Not on file  Stress: Not on file  Social Connections: Not on file  Intimate Partner Violence: Not on file     BP 119/78   Pulse 91   Ht '6\' 4"'$  (1.93 m)   Wt (!) 355 lb (161 kg)   BMI 43.21 kg/m   Physical Exam:  Well appearing NAD HEENT: Unremarkable Neck:  No JVD, no thyromegally Lymphatics:  No adenopathy Back:  No CVA tenderness Lungs:  Clear with no wheezes HEART:  Regular rate rhythm, no murmurs, no rubs, no clicks Abd:  soft, positive bowel sounds, no organomegally, no rebound, no guarding Ext:  2 plus pulses, no edema, no cyanosis, no clubbing Skin:  No rashes no nodules Neuro:  CN II through XII intact, motor grossly intact  EKG - nsr with ventricular pacing  DEVICE  Normal device function.  See PaceArt for details.   Assess/Plan:   1. Mobitz 2, second degree AV block - he is now in CHB. he is asymptomatic s/p PPM  insertion.  2. PPM - he is s/p PPM revision and insertion of a new DDD PM system. His leads and generator are working normally. 3. Obesity - we discussed the importance of weight loss.  4. Chronic diastolic heart failure - Again I encouraged weight loss and a low sodium diet. He will continue his current meds.   Juan Hanson.

## 2022-05-29 NOTE — Patient Instructions (Signed)
Medication Instructions:  Your physician recommends that you continue on your current medications as directed. Please refer to the Current Medication list given to you today.  *If you need a refill on your cardiac medications before your next appointment, please call your pharmacy*  Lab Work: None ordered.  If you have labs (blood work) drawn today and your tests are completely normal, you will receive your results only by: [place] (if you have MyChart) OR A paper copy in the mail If you have any lab test that is abnormal or we need to change your treatment, we will call you to review the results.  Testing/Procedures: None ordered.  Follow-Up: At [facility], you and your health needs are our priority.  As part of our continuing mission to provide you with exceptional heart care, we have created designated Provider Care Teams.  These Care Teams include your primary Cardiologist (physician) and Advanced Practice Providers (APPs -  Physician Assistants and Nurse Practitioners) who all work together to provide you with the care you need, when you need it.  We recommend signing up for the patient portal called "MyChart".  Sign up information is provided on this After Visit Summary.  MyChart is used to connect with patients for Virtual Visits (Telemedicine).  Patients are able to view lab/test results, encounter notes, upcoming appointments, etc.  Non-urgent messages can be sent to your provider as well.   To learn more about what you can do with MyChart, go to [email or web address].    Your next appointment:   1 year(s)  The format for your next appointment:   In Person  Provider:   [name], MD{or one of the following Advanced Practice Providers on your designated Care Team:   Tommye Standard, [place] Legrand Como "Jonni Sanger" Chalmers Cater, [place]  Remote monitoring is used to monitor your Pacemaker from home. This monitoring reduces the number of office visits required to check your device to  one time per year. It allows [place] to keep an eye on the functioning of your device to ensure it is working properly. You are scheduled for a device check from home on 08/16/2022. You may send your transmission at any time that day. If you have a wireless device, the transmission will be sent automatically. After your physician reviews your transmission, you will receive a postcard with your next transmission date.  Important Information About Sugar

## 2022-06-08 NOTE — Progress Notes (Signed)
Remote pacemaker transmission.   

## 2022-06-17 IMAGING — DX DG CHEST 2V
2 series · 2 of 2 positions shown · non-contrast
Comparison: Chest radiograph 12/19/2016

CLINICAL DATA: Pacemaker lead failure

EXAM:
CHEST - 2 VIEW

[chest lat]
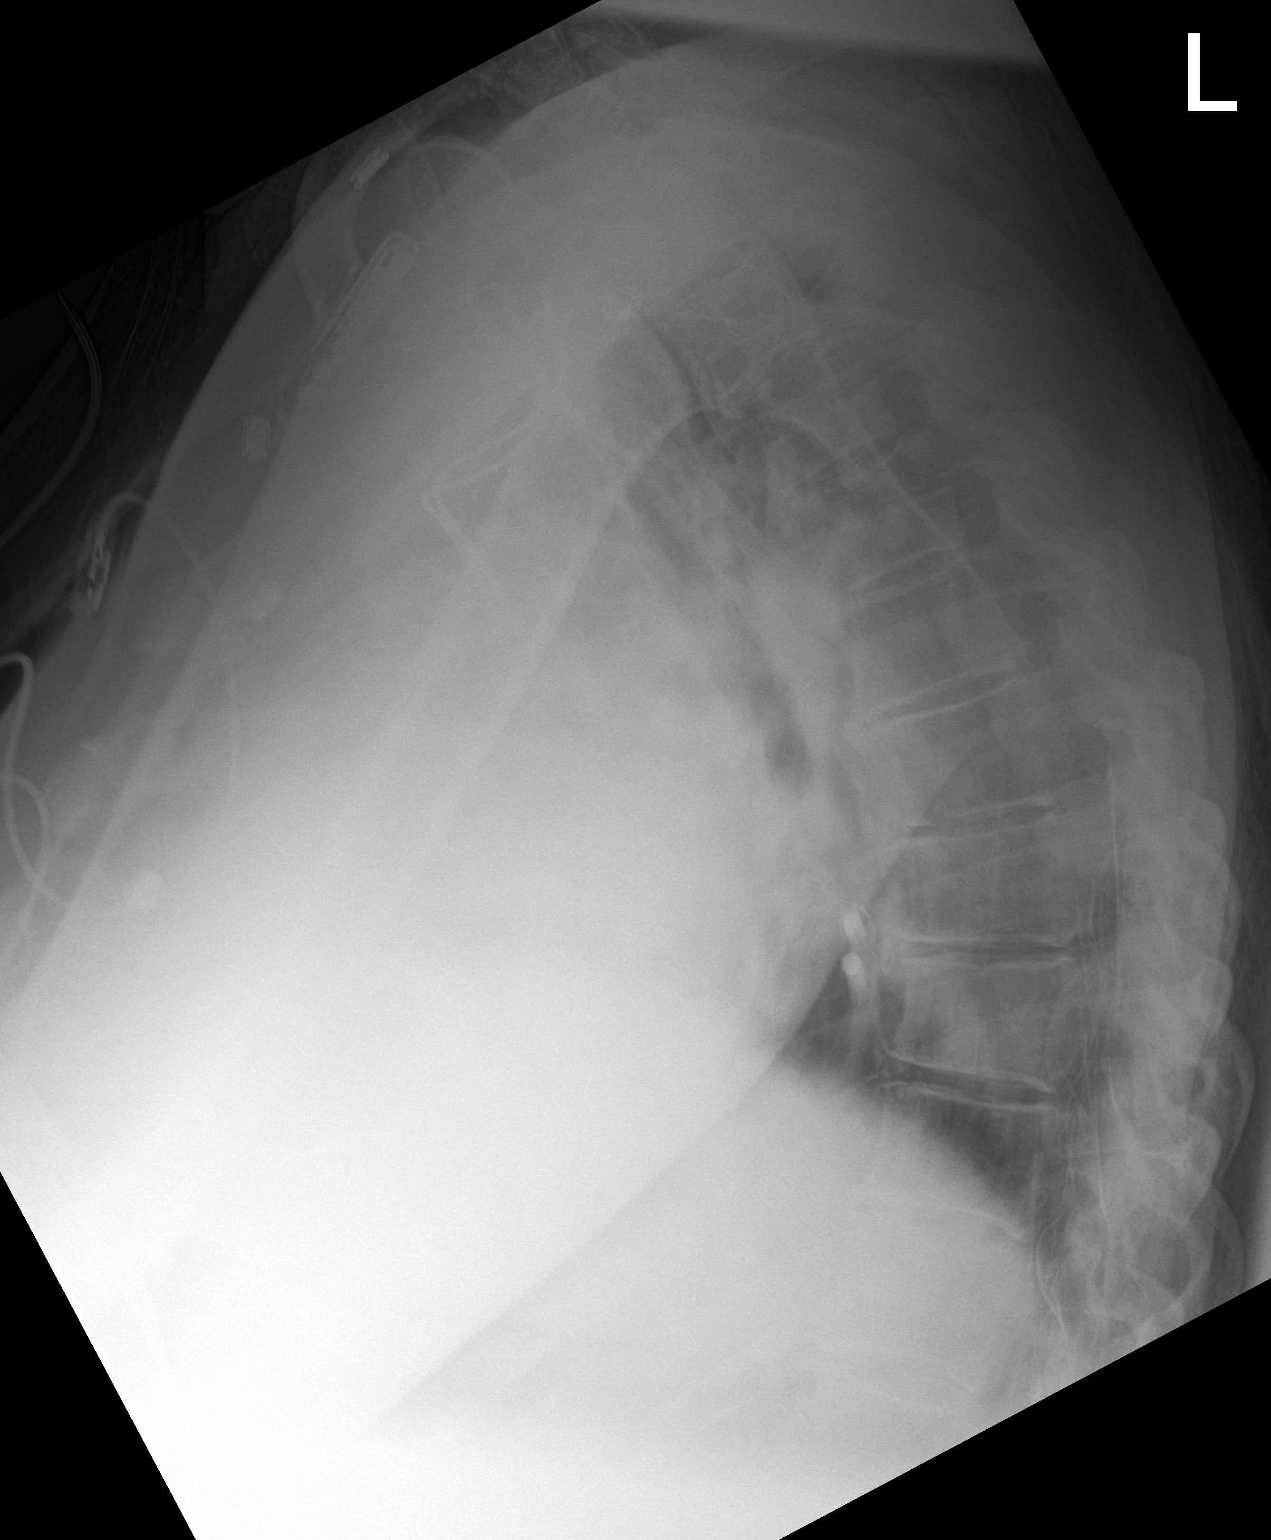

[chest ap]
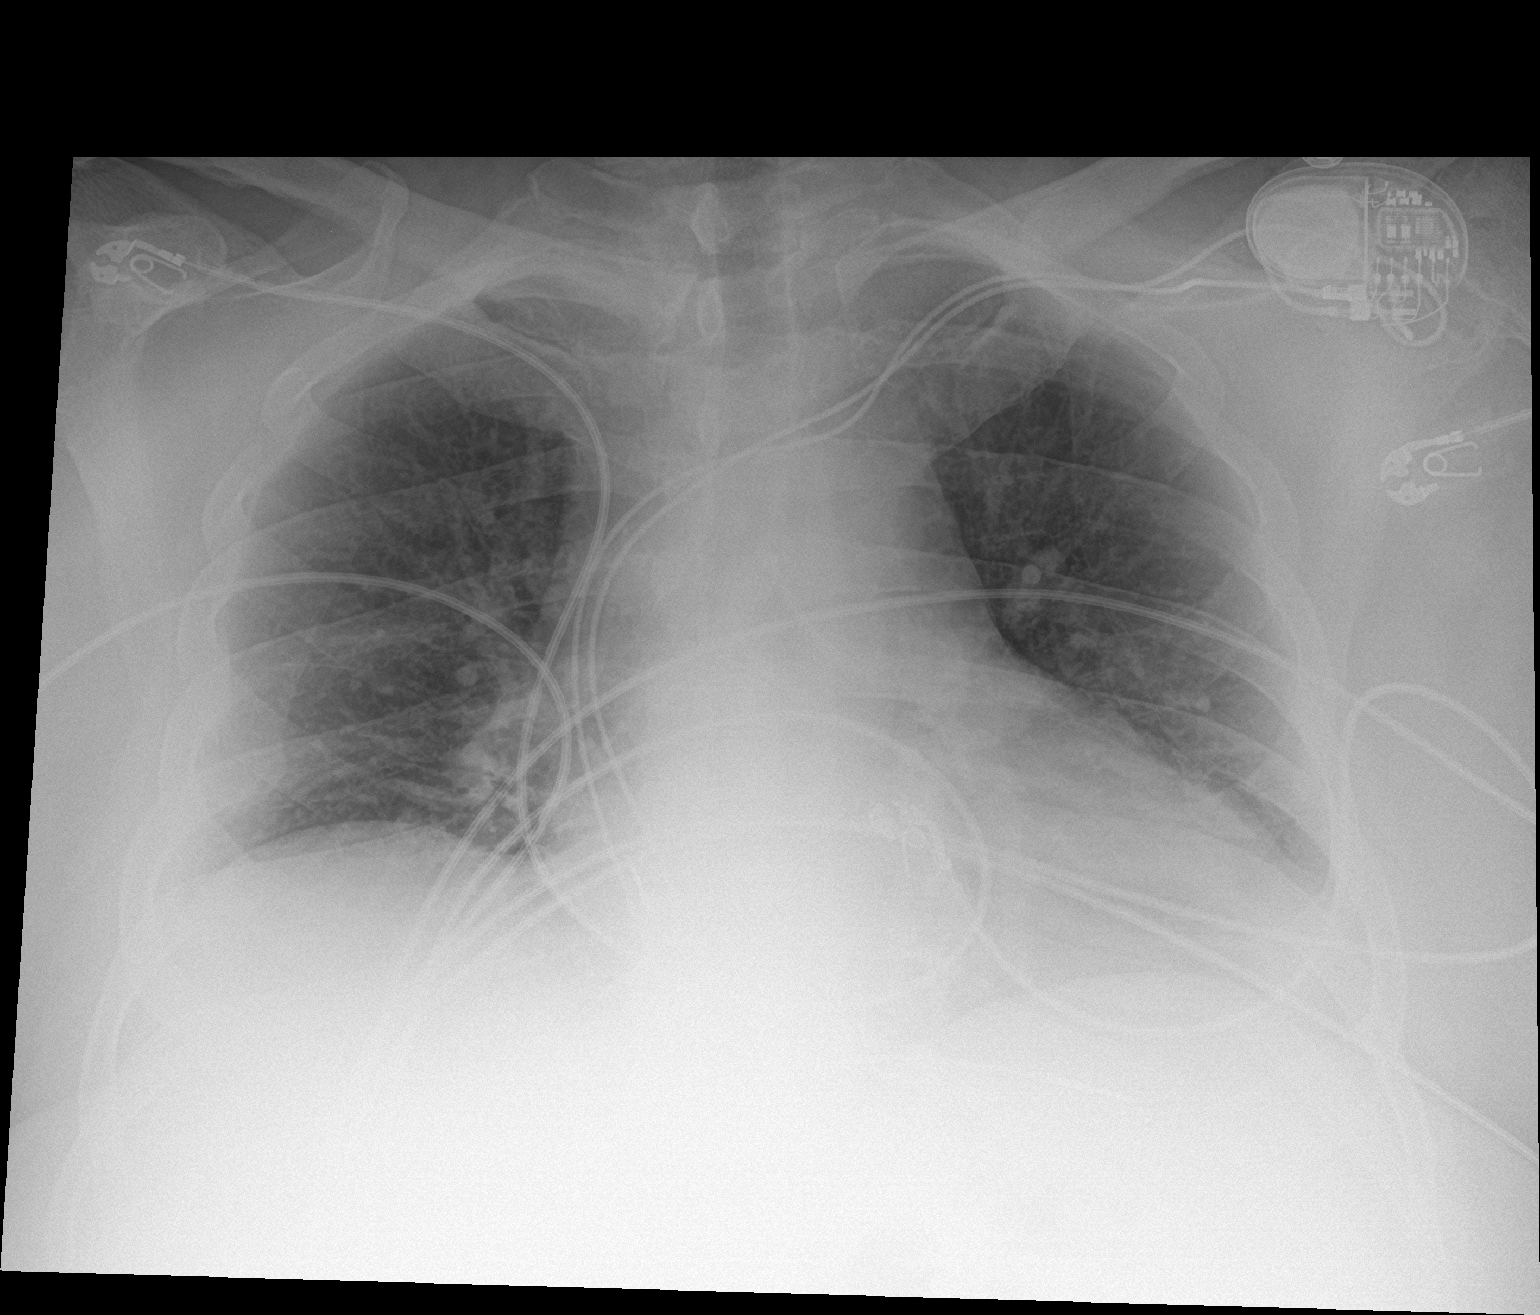

[2 of 2 positions shown; findings below may reference images not displayed]

FINDINGS: A left chest wall cardiac device is in place with leads terminating
over the expected locations of the right atrium and right ventricle.
The leads are intact.

The heart is enlarged, unchanged. The upper mediastinal contours are
prominent, exaggerated by AP technique and low lung volumes

Lung volumes are low. There is no focal consolidation or pulmonary
edema. There is no pleural effusion or pneumothorax

There is no acute osseous abnormality.
IMPRESSION: Dual lead left chest wall cardiac device in place without evidence
of complication. No pneumothorax.

## 2022-08-16 ENCOUNTER — Ambulatory Visit (INDEPENDENT_AMBULATORY_CARE_PROVIDER_SITE_OTHER): Payer: Medicare Other

## 2022-08-16 DIAGNOSIS — I441 Atrioventricular block, second degree: Secondary | ICD-10-CM

## 2022-08-16 LAB — CUP PACEART REMOTE DEVICE CHECK
Battery Remaining Longevity: 145 mo
Battery Voltage: 3.15 V
Brady Statistic AP VP Percent: 23.1 %
Brady Statistic AP VS Percent: 0 %
Brady Statistic AS VP Percent: 76.69 %
Brady Statistic AS VS Percent: 0.21 %
Brady Statistic RA Percent Paced: 23.22 %
Brady Statistic RV Percent Paced: 99.79 %
Date Time Interrogation Session: 20231019035958
Implantable Lead Implant Date: 20180220
Implantable Lead Implant Date: 20230418
Implantable Lead Location: 753859
Implantable Lead Location: 753860
Implantable Lead Model: 5076
Implantable Lead Model: 5076
Implantable Pulse Generator Implant Date: 20230418
Lead Channel Impedance Value: 323 Ohm
Lead Channel Impedance Value: 361 Ohm
Lead Channel Impedance Value: 399 Ohm
Lead Channel Impedance Value: 475 Ohm
Lead Channel Pacing Threshold Amplitude: 0.5 V
Lead Channel Pacing Threshold Amplitude: 0.75 V
Lead Channel Pacing Threshold Pulse Width: 0.4 ms
Lead Channel Pacing Threshold Pulse Width: 0.4 ms
Lead Channel Sensing Intrinsic Amplitude: 17.75 mV
Lead Channel Sensing Intrinsic Amplitude: 2.875 mV
Lead Channel Sensing Intrinsic Amplitude: 2.875 mV
Lead Channel Setting Pacing Amplitude: 1.5 V
Lead Channel Setting Pacing Amplitude: 2 V
Lead Channel Setting Pacing Pulse Width: 0.4 ms
Lead Channel Setting Sensing Sensitivity: 4 mV

## 2022-08-28 NOTE — Progress Notes (Signed)
Remote pacemaker transmission.   

## 2022-08-30 DIAGNOSIS — H2513 Age-related nuclear cataract, bilateral: Secondary | ICD-10-CM | POA: Diagnosis not present

## 2022-10-05 DIAGNOSIS — R972 Elevated prostate specific antigen [PSA]: Secondary | ICD-10-CM | POA: Diagnosis not present

## 2022-10-12 DIAGNOSIS — R972 Elevated prostate specific antigen [PSA]: Secondary | ICD-10-CM | POA: Diagnosis not present

## 2022-10-12 DIAGNOSIS — Z8042 Family history of malignant neoplasm of prostate: Secondary | ICD-10-CM | POA: Diagnosis not present

## 2022-10-12 DIAGNOSIS — N400 Enlarged prostate without lower urinary tract symptoms: Secondary | ICD-10-CM | POA: Diagnosis not present

## 2022-11-15 ENCOUNTER — Ambulatory Visit: Payer: Medicare Other | Attending: Internal Medicine

## 2022-11-15 DIAGNOSIS — I441 Atrioventricular block, second degree: Secondary | ICD-10-CM | POA: Diagnosis not present

## 2022-11-15 LAB — CUP PACEART REMOTE DEVICE CHECK
Battery Remaining Longevity: 143 mo
Battery Voltage: 3.11 V
Brady Statistic AP VP Percent: 14.25 %
Brady Statistic AP VS Percent: 0 %
Brady Statistic AS VP Percent: 85.53 %
Brady Statistic AS VS Percent: 0.22 %
Brady Statistic RA Percent Paced: 14.37 %
Brady Statistic RV Percent Paced: 99.78 %
Date Time Interrogation Session: 20240117214648
Implantable Lead Connection Status: 753985
Implantable Lead Connection Status: 753985
Implantable Lead Implant Date: 20180220
Implantable Lead Implant Date: 20230418
Implantable Lead Location: 753859
Implantable Lead Location: 753860
Implantable Lead Model: 5076
Implantable Lead Model: 5076
Implantable Pulse Generator Implant Date: 20230418
Lead Channel Impedance Value: 304 Ohm
Lead Channel Impedance Value: 361 Ohm
Lead Channel Impedance Value: 399 Ohm
Lead Channel Impedance Value: 494 Ohm
Lead Channel Pacing Threshold Amplitude: 0.5 V
Lead Channel Pacing Threshold Amplitude: 0.875 V
Lead Channel Pacing Threshold Pulse Width: 0.4 ms
Lead Channel Pacing Threshold Pulse Width: 0.4 ms
Lead Channel Sensing Intrinsic Amplitude: 17.75 mV
Lead Channel Sensing Intrinsic Amplitude: 3.125 mV
Lead Channel Sensing Intrinsic Amplitude: 3.125 mV
Lead Channel Setting Pacing Amplitude: 1.5 V
Lead Channel Setting Pacing Amplitude: 2 V
Lead Channel Setting Pacing Pulse Width: 0.4 ms
Lead Channel Setting Sensing Sensitivity: 4 mV
Zone Setting Status: 755011
Zone Setting Status: 755011

## 2022-12-06 NOTE — Progress Notes (Signed)
Remote pacemaker transmission.   

## 2023-02-04 DIAGNOSIS — Z6841 Body Mass Index (BMI) 40.0 and over, adult: Secondary | ICD-10-CM | POA: Diagnosis not present

## 2023-02-04 DIAGNOSIS — Z0000 Encounter for general adult medical examination without abnormal findings: Secondary | ICD-10-CM | POA: Diagnosis not present

## 2023-02-04 DIAGNOSIS — I10 Essential (primary) hypertension: Secondary | ICD-10-CM | POA: Diagnosis not present

## 2023-02-04 DIAGNOSIS — Z950 Presence of cardiac pacemaker: Secondary | ICD-10-CM | POA: Diagnosis not present

## 2023-02-04 DIAGNOSIS — Z8669 Personal history of other diseases of the nervous system and sense organs: Secondary | ICD-10-CM | POA: Diagnosis not present

## 2023-02-14 ENCOUNTER — Ambulatory Visit (INDEPENDENT_AMBULATORY_CARE_PROVIDER_SITE_OTHER): Payer: Medicare Other

## 2023-02-14 DIAGNOSIS — I441 Atrioventricular block, second degree: Secondary | ICD-10-CM

## 2023-02-14 LAB — CUP PACEART REMOTE DEVICE CHECK
Battery Remaining Longevity: 141 mo
Battery Voltage: 3.06 V
Brady Statistic AP VP Percent: 12.68 %
Brady Statistic AP VS Percent: 0 %
Brady Statistic AS VP Percent: 87.04 %
Brady Statistic AS VS Percent: 0.28 %
Brady Statistic RA Percent Paced: 12.8 %
Brady Statistic RV Percent Paced: 99.72 %
Date Time Interrogation Session: 20240417223343
Implantable Lead Connection Status: 753985
Implantable Lead Connection Status: 753985
Implantable Lead Implant Date: 20180220
Implantable Lead Implant Date: 20230418
Implantable Lead Location: 753859
Implantable Lead Location: 753860
Implantable Lead Model: 5076
Implantable Lead Model: 5076
Implantable Pulse Generator Implant Date: 20230418
Lead Channel Impedance Value: 304 Ohm
Lead Channel Impedance Value: 361 Ohm
Lead Channel Impedance Value: 418 Ohm
Lead Channel Impedance Value: 513 Ohm
Lead Channel Pacing Threshold Amplitude: 0.5 V
Lead Channel Pacing Threshold Amplitude: 0.75 V
Lead Channel Pacing Threshold Pulse Width: 0.4 ms
Lead Channel Pacing Threshold Pulse Width: 0.4 ms
Lead Channel Sensing Intrinsic Amplitude: 17.75 mV
Lead Channel Sensing Intrinsic Amplitude: 2.5 mV
Lead Channel Sensing Intrinsic Amplitude: 2.5 mV
Lead Channel Setting Pacing Amplitude: 1.5 V
Lead Channel Setting Pacing Amplitude: 2 V
Lead Channel Setting Pacing Pulse Width: 0.4 ms
Lead Channel Setting Sensing Sensitivity: 4 mV
Zone Setting Status: 755011
Zone Setting Status: 755011

## 2023-03-18 NOTE — Progress Notes (Signed)
Remote pacemaker transmission.   

## 2023-04-10 DIAGNOSIS — R972 Elevated prostate specific antigen [PSA]: Secondary | ICD-10-CM | POA: Diagnosis not present

## 2023-04-17 DIAGNOSIS — R972 Elevated prostate specific antigen [PSA]: Secondary | ICD-10-CM | POA: Diagnosis not present

## 2023-04-17 DIAGNOSIS — N400 Enlarged prostate without lower urinary tract symptoms: Secondary | ICD-10-CM | POA: Diagnosis not present

## 2023-05-16 ENCOUNTER — Ambulatory Visit (INDEPENDENT_AMBULATORY_CARE_PROVIDER_SITE_OTHER): Payer: Medicare Other

## 2023-05-16 DIAGNOSIS — I441 Atrioventricular block, second degree: Secondary | ICD-10-CM | POA: Diagnosis not present

## 2023-05-16 DIAGNOSIS — D122 Benign neoplasm of ascending colon: Secondary | ICD-10-CM | POA: Diagnosis not present

## 2023-05-16 DIAGNOSIS — Z09 Encounter for follow-up examination after completed treatment for conditions other than malignant neoplasm: Secondary | ICD-10-CM | POA: Diagnosis not present

## 2023-05-16 DIAGNOSIS — D128 Benign neoplasm of rectum: Secondary | ICD-10-CM | POA: Diagnosis not present

## 2023-05-16 DIAGNOSIS — K648 Other hemorrhoids: Secondary | ICD-10-CM | POA: Diagnosis not present

## 2023-05-16 DIAGNOSIS — Z86010 Personal history of colonic polyps: Secondary | ICD-10-CM | POA: Diagnosis not present

## 2023-05-16 DIAGNOSIS — D124 Benign neoplasm of descending colon: Secondary | ICD-10-CM | POA: Diagnosis not present

## 2023-05-16 LAB — CUP PACEART REMOTE DEVICE CHECK
Battery Remaining Longevity: 138 mo
Battery Voltage: 3.04 V
Brady Statistic AP VP Percent: 14.21 %
Brady Statistic AP VS Percent: 0 %
Brady Statistic AS VP Percent: 85.48 %
Brady Statistic AS VS Percent: 0.31 %
Brady Statistic RA Percent Paced: 14.34 %
Brady Statistic RV Percent Paced: 99.69 %
Date Time Interrogation Session: 20240717213559
Implantable Lead Connection Status: 753985
Implantable Lead Connection Status: 753985
Implantable Lead Implant Date: 20180220
Implantable Lead Implant Date: 20230418
Implantable Lead Location: 753859
Implantable Lead Location: 753860
Implantable Lead Model: 5076
Implantable Lead Model: 5076
Implantable Pulse Generator Implant Date: 20230418
Lead Channel Impedance Value: 304 Ohm
Lead Channel Impedance Value: 361 Ohm
Lead Channel Impedance Value: 418 Ohm
Lead Channel Impedance Value: 494 Ohm
Lead Channel Pacing Threshold Amplitude: 0.5 V
Lead Channel Pacing Threshold Amplitude: 0.75 V
Lead Channel Pacing Threshold Pulse Width: 0.4 ms
Lead Channel Pacing Threshold Pulse Width: 0.4 ms
Lead Channel Sensing Intrinsic Amplitude: 17.75 mV
Lead Channel Sensing Intrinsic Amplitude: 2.75 mV
Lead Channel Sensing Intrinsic Amplitude: 2.75 mV
Lead Channel Setting Pacing Amplitude: 1.5 V
Lead Channel Setting Pacing Amplitude: 2 V
Lead Channel Setting Pacing Pulse Width: 0.4 ms
Lead Channel Setting Sensing Sensitivity: 4 mV
Zone Setting Status: 755011
Zone Setting Status: 755011

## 2023-05-20 DIAGNOSIS — D128 Benign neoplasm of rectum: Secondary | ICD-10-CM | POA: Diagnosis not present

## 2023-05-27 DIAGNOSIS — I10 Essential (primary) hypertension: Secondary | ICD-10-CM | POA: Diagnosis not present

## 2023-05-27 DIAGNOSIS — G4733 Obstructive sleep apnea (adult) (pediatric): Secondary | ICD-10-CM | POA: Diagnosis not present

## 2023-05-30 NOTE — Progress Notes (Signed)
Remote pacemaker transmission.   

## 2023-07-16 ENCOUNTER — Ambulatory Visit: Payer: Medicare Other | Attending: Internal Medicine | Admitting: Internal Medicine

## 2023-07-16 ENCOUNTER — Encounter: Payer: Self-pay | Admitting: Internal Medicine

## 2023-07-16 VITALS — BP 128/80 | HR 84 | Ht 76.0 in | Wt 370.0 lb

## 2023-07-16 DIAGNOSIS — I441 Atrioventricular block, second degree: Secondary | ICD-10-CM | POA: Diagnosis not present

## 2023-07-16 DIAGNOSIS — Z950 Presence of cardiac pacemaker: Secondary | ICD-10-CM | POA: Diagnosis not present

## 2023-07-16 LAB — CUP PACEART INCLINIC DEVICE CHECK
Date Time Interrogation Session: 20240917165231
Implantable Lead Connection Status: 753985
Implantable Lead Connection Status: 753985
Implantable Lead Implant Date: 20180220
Implantable Lead Implant Date: 20230418
Implantable Lead Location: 753859
Implantable Lead Location: 753860
Implantable Lead Model: 5076
Implantable Lead Model: 5076
Implantable Pulse Generator Implant Date: 20230418

## 2023-07-16 NOTE — Progress Notes (Signed)
HPI [name] returns today for followup. He is a pleasant 67 yo man with progressive heart block, now complete, and morbid obesity, s/p PPM insertion. He developed PM lead disfunction and exit block and underwent extraction and insertion of a new ventricular pacing lead. He denies chest pain. No dyspnea. He has mild peripheral edema but has been on norvasc. He admits to dietary indiscretion with sodium. His weight remains elevated. In the interim he notes that he gained weight but has now lost it back. He has started using a rowing machine.  No Known Allergies   Current Outpatient Medications  Medication Sig Dispense Refill   acetaminophen (TYLENOL) 325 MG tablet Take 1-2 tablets (325-650 mg total) by mouth every 4 (four) hours as needed for mild pain.     amLODipine (NORVASC) 10 MG tablet Take 10 mg by mouth daily.     hydrochlorothiazide (HYDRODIURIL) 25 MG tablet Take 25 mg by mouth daily.     losartan (COZAAR) 100 MG tablet Take 100 mg by mouth daily.     No current facility-administered medications for this visit.     Past Medical History:  Diagnosis Date   AV block, 2nd degree    /notes 12/18/2016   Headache    "had them when I woke up before getting my CPAP; never had migraines" (12/18/2016)   HTN (hypertension)    OSA on CPAP    Pneumonia ~ 2012   Presence of permanent cardiac pacemaker    Right bundle branch block    /notes 12/18/2016    ROS:   All systems reviewed and negative except as noted in the HPI.   Past Surgical History:  Procedure Laterality Date   COLONOSCOPY  X 2   INSERT / REPLACE / REMOVE PACEMAKER  12/18/2016   PACEMAKER IMPLANT N/A 12/18/2016   Procedure: Pacemaker Implant;  Surgeon: Marinus Maw, MD;  Location: [facility] INVASIVE CV LAB;  Service: Cardiovascular;  Laterality: N/A;   PACEMAKER LEAD REMOVAL N/A 02/13/2022   Procedure: PACEMAKER LEAD REMOVAL, PACEMAKER LEAD INCERTION, PACEMAKER GENERATOR CHANGE;  Surgeon: Marinus Maw, MD;   Location: [facility] OR;  Service: Cardiovascular;  Laterality: N/A;     Family History  Problem Relation Age of Onset   Lung cancer Mother    Arthritis Mother    Colon cancer Father    Lung cancer Father    Healthy Brother        x3   Healthy Sister      Social History   Socioeconomic History   Marital status: Divorced    Spouse name: Not on file   Number of children: 2   Years of education: Not on file   Highest education level: Not on file  Occupational History   Not on file  Tobacco Use   Smoking status: Never   Smokeless tobacco: Former    Types: Snuff    Quit date: 06/11/2016  Vaping Use   Vaping status: Never Used  Substance and Sexual Activity   Alcohol use: Yes    Alcohol/week: 0.0 standard drinks of alcohol    Comment: 12/18/2016 "<10 drinks/year"   Drug use: No   Sexual activity: Not Currently  Other Topics Concern   Not on file  Social History Narrative   Not on file   Social Determinants of Health   Financial Resource Strain: Not on file  Food Insecurity: Not on file  Transportation Needs: Not on file  Physical Activity: Not on file  Stress: Not on file  Social Connections: Not on file  Intimate Partner Violence: Not on file     BP 128/80   Pulse 84   Ht 6\' 4"  (1.93 m)   Wt (!) 370 lb (167.8 kg)   SpO2 96%   BMI 45.04 kg/m   Physical Exam:  Morbidly obese appearing NAD HEENT: Unremarkable Neck:  No JVD, no thyromegally Lymphatics:  No adenopathy Back:  No CVA tenderness Lungs:  Clear with no wheezes HEART:  Regular rate rhythm, no murmurs, no rubs, no clicks Abd:  soft, positive bowel sounds, no organomegally, no rebound, no guarding Ext:  2 plus pulses, no edema, no cyanosis, no clubbing Skin:  No rashes no nodules Neuro:  CN II through XII intact, motor grossly intact  EKG -P synchronous ventricular pacing  DEVICE  Normal device function.  See PaceArt for details.   Assess/Plan:   1. CHB - he is asymptomatic s/p PPM  insertion.   2. PPM - he is s/p PPM revision and insertion of a new DDD PM system. His leads and generator are working normally. 3. Obesity - we discussed the importance of weight loss.  4. Chronic diastolic heart failure - Again I encouraged weight loss and a low sodium diet. He will continue his current meds.   Dorathy Daft.

## 2023-07-16 NOTE — Patient Instructions (Signed)
Medication Instructions:  Your physician recommends that you continue on your current medications as directed. Please refer to the Current Medication list given to you today.  *If you need a refill on your cardiac medications before your next appointment, please call your pharmacy*  Lab Work: None ordered.  If you have labs (blood work) drawn today and your tests are completely normal, you will receive your results only by: MyChart Message (if you have MyChart) OR A paper copy in the mail If you have any lab test that is abnormal or we need to change your treatment, we will call you to review the results.  Testing/Procedures: None ordered.  Follow-Up: At [facility], you and your health needs are our priority.  As part of our continuing mission to provide you with exceptional heart care, we have created designated Provider Care Teams.  These Care Teams include your primary Cardiologist (physician) and Advanced Practice Providers (APPs -  Physician Assistants and Nurse Practitioners) who all work together to provide you with the care you need, when you need it.  Your next appointment:   1 year(s)  The format for your next appointment:   In Person  Provider:   Lewayne Bunting, MD{or one of the following Advanced Practice Providers on your designated Care Team:   Francis Dowse, [place] Casimiro Needle "Mardelle Matte" [place], [place] Earnest Rosier, NP    Important Information About Sugar

## 2023-07-22 DIAGNOSIS — K080 Exfoliation of teeth due to systemic causes: Secondary | ICD-10-CM | POA: Diagnosis not present

## 2023-08-15 ENCOUNTER — Ambulatory Visit (INDEPENDENT_AMBULATORY_CARE_PROVIDER_SITE_OTHER): Payer: Medicare Other

## 2023-08-15 DIAGNOSIS — I441 Atrioventricular block, second degree: Secondary | ICD-10-CM

## 2023-08-16 LAB — CUP PACEART REMOTE DEVICE CHECK
Battery Remaining Longevity: 135 mo
Battery Voltage: 3.03 V
Brady Statistic AP VP Percent: 26.59 %
Brady Statistic AP VS Percent: 0 %
Brady Statistic AS VP Percent: 72.99 %
Brady Statistic AS VS Percent: 0.42 %
Brady Statistic RA Percent Paced: 26.74 %
Brady Statistic RV Percent Paced: 99.58 %
Date Time Interrogation Session: 20241017010040
Implantable Lead Connection Status: 753985
Implantable Lead Connection Status: 753985
Implantable Lead Implant Date: 20180220
Implantable Lead Implant Date: 20230418
Implantable Lead Location: 753859
Implantable Lead Location: 753860
Implantable Lead Model: 5076
Implantable Lead Model: 5076
Implantable Pulse Generator Implant Date: 20230418
Lead Channel Impedance Value: 323 Ohm
Lead Channel Impedance Value: 361 Ohm
Lead Channel Impedance Value: 418 Ohm
Lead Channel Impedance Value: 494 Ohm
Lead Channel Pacing Threshold Amplitude: 0.5 V
Lead Channel Pacing Threshold Amplitude: 0.625 V
Lead Channel Pacing Threshold Pulse Width: 0.4 ms
Lead Channel Pacing Threshold Pulse Width: 0.4 ms
Lead Channel Sensing Intrinsic Amplitude: 17.75 mV
Lead Channel Sensing Intrinsic Amplitude: 2.25 mV
Lead Channel Sensing Intrinsic Amplitude: 2.25 mV
Lead Channel Setting Pacing Amplitude: 1.5 V
Lead Channel Setting Pacing Amplitude: 2 V
Lead Channel Setting Pacing Pulse Width: 0.4 ms
Lead Channel Setting Sensing Sensitivity: 4 mV
Zone Setting Status: 755011
Zone Setting Status: 755011

## 2023-09-04 NOTE — Progress Notes (Signed)
Remote pacemaker transmission.   

## 2023-09-11 DIAGNOSIS — K080 Exfoliation of teeth due to systemic causes: Secondary | ICD-10-CM | POA: Diagnosis not present

## 2023-09-16 DIAGNOSIS — H5201 Hypermetropia, right eye: Secondary | ICD-10-CM | POA: Diagnosis not present

## 2023-10-25 ENCOUNTER — Other Ambulatory Visit (HOSPITAL_COMMUNITY): Payer: Self-pay

## 2023-10-25 DIAGNOSIS — R972 Elevated prostate specific antigen [PSA]: Secondary | ICD-10-CM | POA: Diagnosis not present

## 2023-10-25 MED ORDER — AREXVY 120 MCG/0.5ML IM SUSR
INTRAMUSCULAR | 0 refills | Status: DC
  Filled 2023-10-25: qty 0.5, 1d supply, fill #0

## 2023-10-26 ENCOUNTER — Other Ambulatory Visit: Payer: Self-pay

## 2023-10-26 ENCOUNTER — Emergency Department (HOSPITAL_COMMUNITY)
Admission: EM | Admit: 2023-10-26 | Discharge: 2023-10-26 | Disposition: A | Discharge status: Home / Self Care | Payer: Medicare Other | Attending: Emergency Medicine | Admitting: Emergency Medicine

## 2023-10-26 ENCOUNTER — Emergency Department (HOSPITAL_COMMUNITY): Payer: Medicare Other

## 2023-10-26 DIAGNOSIS — R0989 Other specified symptoms and signs involving the circulatory and respiratory systems: Secondary | ICD-10-CM | POA: Diagnosis not present

## 2023-10-26 DIAGNOSIS — I10 Essential (primary) hypertension: Secondary | ICD-10-CM | POA: Insufficient documentation

## 2023-10-26 DIAGNOSIS — Z79899 Other long term (current) drug therapy: Secondary | ICD-10-CM | POA: Insufficient documentation

## 2023-10-26 DIAGNOSIS — K760 Fatty (change of) liver, not elsewhere classified: Secondary | ICD-10-CM | POA: Diagnosis not present

## 2023-10-26 DIAGNOSIS — R059 Cough, unspecified: Secondary | ICD-10-CM | POA: Diagnosis not present

## 2023-10-26 DIAGNOSIS — J9811 Atelectasis: Secondary | ICD-10-CM | POA: Diagnosis not present

## 2023-10-26 DIAGNOSIS — R058 Other specified cough: Secondary | ICD-10-CM | POA: Diagnosis not present

## 2023-10-26 DIAGNOSIS — U071 COVID-19: Secondary | ICD-10-CM | POA: Diagnosis not present

## 2023-10-26 DIAGNOSIS — R0602 Shortness of breath: Secondary | ICD-10-CM | POA: Diagnosis not present

## 2023-10-26 DIAGNOSIS — R918 Other nonspecific abnormal finding of lung field: Secondary | ICD-10-CM | POA: Diagnosis not present

## 2023-10-26 LAB — CBC WITH DIFFERENTIAL/PLATELET
Abs Immature Granulocytes: 0.05 10*3/uL (ref 0.00–0.07)
Basophils Absolute: 0 10*3/uL (ref 0.0–0.1)
Basophils Relative: 1 %
Eosinophils Absolute: 0 10*3/uL (ref 0.0–0.5)
Eosinophils Relative: 0 %
HCT: 40.7 % (ref 39.0–52.0)
Hemoglobin: 13.6 g/dL (ref 13.0–17.0)
Immature Granulocytes: 1 %
Lymphocytes Relative: 6 %
Lymphs Abs: 0.4 10*3/uL — ABNORMAL LOW (ref 0.7–4.0)
MCH: 28.2 pg (ref 26.0–34.0)
MCHC: 33.4 g/dL (ref 30.0–36.0)
MCV: 84.3 fL (ref 80.0–100.0)
Monocytes Absolute: 0.9 10*3/uL (ref 0.1–1.0)
Monocytes Relative: 15 %
Neutro Abs: 4.9 10*3/uL (ref 1.7–7.7)
Neutrophils Relative %: 77 %
Platelets: 182 10*3/uL (ref 150–400)
RBC: 4.83 MIL/uL (ref 4.22–5.81)
RDW: 13.3 % (ref 11.5–15.5)
WBC: 6.3 10*3/uL (ref 4.0–10.5)
nRBC: 0 % (ref 0.0–0.2)

## 2023-10-26 LAB — COMPREHENSIVE METABOLIC PANEL
ALT: 31 U/L (ref 0–44)
AST: 28 U/L (ref 15–41)
Albumin: 3.4 g/dL — ABNORMAL LOW (ref 3.5–5.0)
Alkaline Phosphatase: 62 U/L (ref 38–126)
Anion gap: 10 (ref 5–15)
BUN: 19 mg/dL (ref 8–23)
CO2: 23 mmol/L (ref 22–32)
Calcium: 8.3 mg/dL — ABNORMAL LOW (ref 8.9–10.3)
Chloride: 101 mmol/L (ref 98–111)
Creatinine, Ser: 1.05 mg/dL (ref 0.61–1.24)
GFR, Estimated: >60 mL/min (ref >60)
Glucose, Bld: 180 mg/dL — ABNORMAL HIGH (ref 70–99)
Potassium: 3.7 mmol/L (ref 3.5–5.1)
Sodium: 134 mmol/L — ABNORMAL LOW (ref 135–145)
Total Bilirubin: 0.2 mg/dL (ref <1.2)
Total Protein: 6.9 g/dL (ref 6.5–8.1)

## 2023-10-26 LAB — RESP PANEL BY RT-PCR (RSV, FLU A&B, COVID)  RVPGX2
Influenza A by PCR: NEGATIVE
Influenza B by PCR: NEGATIVE
Resp Syncytial Virus by PCR: NEGATIVE
SARS Coronavirus 2 by RT PCR: POSITIVE — AB

## 2023-10-26 LAB — BRAIN NATRIURETIC PEPTIDE: B Natriuretic Peptide: 80 pg/mL (ref 0.0–100.0)

## 2023-10-26 MED ORDER — METHYLPREDNISOLONE SODIUM SUCC 125 MG IJ SOLR
125.0000 mg | Freq: Once | INTRAMUSCULAR | Status: AC
  Administered 2023-10-26: 125 mg via INTRAVENOUS
  Filled 2023-10-26: qty 2

## 2023-10-26 MED ORDER — IOHEXOL 350 MG/ML SOLN
75.0000 mL | Freq: Once | INTRAVENOUS | Status: AC | PRN
  Administered 2023-10-26: 75 mL via INTRAVENOUS

## 2023-10-26 MED ORDER — ALBUTEROL SULFATE (2.5 MG/3ML) 0.083% IN NEBU
2.5000 mg | INHALATION_SOLUTION | Freq: Once | RESPIRATORY_TRACT | Status: AC
  Administered 2023-10-26: 2.5 mg via RESPIRATORY_TRACT
  Filled 2023-10-26: qty 3

## 2023-10-26 MED ORDER — IPRATROPIUM-ALBUTEROL 0.5-2.5 (3) MG/3ML IN SOLN
3.0000 mL | RESPIRATORY_TRACT | Status: AC
  Administered 2023-10-26 (×3): 3 mL via RESPIRATORY_TRACT
  Filled 2023-10-26: qty 9

## 2023-10-26 NOTE — ED Triage Notes (Signed)
Patient reports SOB with productive cough , fever and chest congestion onset last night . Received RSV vaccine yesterday .

## 2023-10-26 NOTE — ED Provider Notes (Addendum)
[place] EMERGENCY DEPARTMENT AT [facility] Provider Note   CSN: [id number] Arrival date & time: 10/26/23  0865     History  Chief Complaint  Patient presents with   Shortness of Breath    Juan Hanson is a 67 y.o. male.   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  Patient has had roughly 2 days of symptoms.  He is tolerating oral intake.  He denies chest pain. He underwent RSV vaccination yesterday and then his symptoms started up. He endorses chills, productive cough.  Home Medications Prior to Admission medications   Medication Sig Start Date End Date Taking? Authorizing Provider  acetaminophen (TYLENOL) 325 MG tablet Take 1-2 tablets (325-650 mg total) by mouth every 4 (four) hours as needed for mild pain. 02/14/22   Graciella Freer, PA-C  amLODipine (NORVASC) 10 MG tablet Take 10 mg by mouth daily. 01/03/18   [provider]  hydrochlorothiazide (HYDRODIURIL) 25 MG tablet Take 25 mg by mouth daily. 09/09/18   [provider]  losartan (COZAAR) 100 MG tablet Take 100 mg by mouth daily. 09/09/18   [provider]  RSV vaccine recomb adjuvanted (AREXVY) 120 MCG/0.5ML injection Inject into the muscle. 10/25/23         Allergies    Patient has no known allergies.    Review of Systems   Review of Systems  Respiratory:  Positive for cough and shortness of breath.   Cardiovascular:  Negative for chest pain.  All other systems reviewed and are negative.   Physical Exam Updated Vital Signs BP (!) 142/86 (BP Location: Right Arm)   Pulse 98   Temp 98.4 F (36.9 C)   Resp (!) 22   SpO2 96%  Physical Exam Vitals and  nursing note reviewed.  Constitutional:      Appearance: He is well-developed.  HENT:     Head: Normocephalic and atraumatic.  Eyes:     Conjunctiva/sclera: Conjunctivae normal.  Cardiovascular:     Rate and Rhythm: Normal rate and regular rhythm.  Pulmonary:     Effort: Pulmonary effort is normal. No respiratory distress.     Breath sounds: Wheezing present.  Abdominal:     Palpations: Abdomen is soft.     Tenderness: There is no abdominal tenderness.  Musculoskeletal:        General: No swelling.     Cervical back: Neck supple.  Skin:    General: Skin is warm and dry.     Capillary Refill: Capillary refill takes less than 2 seconds.  Neurological:     Mental Status: He is alert.  Psychiatric:        Mood and Affect: Mood normal.     ED Results / Procedures / Treatments   Labs (all labs ordered are listed, but only abnormal results are displayed) Labs Reviewed  RESP PANEL BY RT-PCR (RSV, FLU A&B, COVID)  RVPGX2 - Abnormal; Notable for the following components:      Result Value   SARS Coronavirus 2 by RT PCR POSITIVE (*)    All other components within normal limits  CBC WITH DIFFERENTIAL/PLATELET - Abnormal; Notable for the following components:   Lymphs Abs 0.4 (*)    All  other components within normal limits  COMPREHENSIVE METABOLIC PANEL - Abnormal; Notable for the following components:   Sodium 134 (*)    Glucose, Bld 180 (*)    Calcium 8.3 (*)    Albumin 3.4 (*)    All other components within normal limits  BRAIN NATRIURETIC PEPTIDE    EKG EKG Interpretation Date/Time:  Saturday October 26 2023 09:55:00 EST Ventricular Rate:  91 PR Interval:  147 QRS Duration:  123 QT Interval:  366 QTC Calculation: 448 R Axis:   265  Text Interpretation: Sinus rhythm Consider left atrial enlargement Right bundle branch block Confirmed by Ernie Avena ([id number]) on 10/26/2023 10:12:38 AM  Radiology CT Angio Chest PE W and/or Wo Contrast Result Date:  10/26/2023 CLINICAL DATA:  Shortness of breath, productive cough. EXAM: CT ANGIOGRAPHY CHEST WITH CONTRAST TECHNIQUE: Multidetector CT imaging of the chest was performed using the standard protocol during bolus administration of intravenous contrast. Multiplanar CT image reconstructions and MIPs were obtained to evaluate the vascular anatomy. RADIATION DOSE REDUCTION: This exam was performed according to the departmental dose-optimization program which includes automated exposure control, adjustment of the mA and/or kV according to patient size and/or use of iterative reconstruction technique. CONTRAST:  75mL OMNIPAQUE IOHEXOL 350 MG/ML SOLN COMPARISON:  None Available. FINDINGS: Cardiovascular: There is no evidence of large central pulmonary embolus seen in the main pulmonary artery or main portions of the left and right pulmonary arteries. However, due to significant respiratory motion artifact, the lower lobe branches of both pulmonary arteries are not well visualized, and therefore smaller and more peripheral pulmonary emboli cannot be excluded on the basis of this exam. Normal cardiac size. No pericardial effusion. Mediastinum/Nodes: No enlarged mediastinal, hilar, or axillary lymph nodes. Thyroid gland, trachea, and esophagus demonstrate no significant findings. Lungs/Pleura: No pneumothorax or pleural effusion is noted. Minimal bibasilar subsegmental atelectasis is noted. Upper Abdomen: Hepatic steatosis. Musculoskeletal: No chest wall abnormality. No acute or significant osseous findings. Review of the MIP images confirms the above findings. IMPRESSION: No definite evidence of large central pulmonary embolus seen in the main pulmonary artery or main portions of the left and right pulmonary arteries. However, due to significant respiratory motion artifact, the lower lobe branches of both pulmonary arteries are not well visualized, and therefore smaller and more peripheral pulmonary emboli cannot be excluded  on the basis of this exam. Hepatic steatosis. Electronically Signed   By: Lupita Raider M.D.   On: 10/26/2023 10:05   DG Chest 2 View Result Date: 10/26/2023 CLINICAL DATA:  Shortness of breath, cough. EXAM: CHEST - 2 VIEW COMPARISON:  February 14, 2022. FINDINGS: Stable cardiomegaly with mild central pulmonary vascular congestion. Left-sided pacemaker is unchanged. Probable small loculated right pleural effusion or pleural thickening is noted. Bony thorax is unremarkable. IMPRESSION: Stable cardiomegaly with mild central pulmonary vascular congestion. Probable small loculated right pleural effusion or pleural thickening is noted; CT scan is recommended to rule out pleural based mass. Electronically Signed   By: Lupita Raider M.D.   On: 10/26/2023 06:19    Procedures Procedures    Medications Ordered in ED Medications  albuterol (PROVENTIL) (2.5 MG/3ML) 0.083% nebulizer solution 2.5 mg (2.5 mg Nebulization Given 10/26/23 0529)  ipratropium-albuterol (DUONEB) 0.5-2.5 (3) MG/3ML nebulizer solution 3 mL (3 mLs Nebulization Given 10/26/23 0832)  methylPREDNISolone sodium succinate (SOLU-MEDROL) 125 mg/2 mL injection 125 mg (125 mg Intravenous Given 10/26/23 0830)  iohexol (OMNIPAQUE) 350 MG/ML injection 75 mL (75 mLs Intravenous Contrast Given 10/26/23 0940)  ED Course/ Medical Decision Making/ A&P Clinical Course as of 10/26/23 1024  Sat Oct 26, 2023  0956 SARS Coronavirus 2 by RT PCR(!): POSITIVE [JL]    Clinical Course User Index [JL] Ernie Avena, MD                                 Medical Decision Making Amount and/or Complexity of Data Reviewed Labs: ordered. Decision-making details documented in ED Course. Radiology: ordered.  Risk Prescription drug management.    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  Patient has had roughly 2 days of symptoms.  He is tolerating oral intake.  He denies chest pain. He underwent RSV vaccination yesterday and then his symptoms started up. He endorses chills, productive cough.  On arrival, the patient was afebrile, temperature 99.5, not tachycardic or tachypneic, heart rate 103, RR 21, BP 143/84, saturating 94% on room air.  Patient presenting with likely viral syndrome, consider developing bacterial pneumonia, considered PE.  Patient with mild wheezing on exam, administered nebulizer treatment, Solu-Medrol with improvement.  A chest x-ray was performed which revealed stable cardiomegaly with potential mild central pulmonary vascular congestion and a probable small loculated right pleural effusion or pleural thickening was noted.  CT scan was ordered recommended to rule out a pleural-based mass.  EKG revealed a paced rhythm, no acute changes noted.  Screening laboratory evaluation revealed CBC without a leukocytosis or anemia, BMP normal, CMP generally unremarkable with mild hyperglycemia to 180, normal bicarbonate at 23, COVID-19, influenza, RSV PCR testing was collected and resulted positive for COVID-19.  Patient on day 2 of symptoms.    CTA imaging:  IMPRESSION:  No definite evidence of large central pulmonary embolus seen in the  main pulmonary artery or main portions of the left and right  pulmonary arteries. However, due to significant respiratory motion  artifact, the lower lobe branches of both pulmonary arteries are not  well visualized, and therefore smaller and more peripheral pulmonary  emboli cannot be excluded on the basis of this exam.    Hepatic steatosis.   Overall well-appearing, and ambulatory pulse oximetry was performed and the patient had did desaturate to 91% on room air.  Patient CT with no evidence of pleural effusion, no evidence of consolidation to suggest pneumonia.  Symptoms are  likely due to COVID-19 infection.  Given the patient's desaturation on ambulatory pulse oximetry, I recommended admission for observation and further oxygen supplementation.  The patient elected to sign out AGAINST MEDICAL ADVICE.  The patient was informed of the risk of worsening hypoxia in the setting of his COVID-19 infection which could result in significant morbidity and mortality but still elected to sign out AMA.  Final Clinical Impression(s) / ED Diagnoses Final diagnoses:  COVID-19    Rx / DC Orders ED Discharge Orders     None         Ernie Avena, MD 10/26/23 1024    Ernie Avena, MD 10/26/23 [phone]

## 2023-10-26 NOTE — Discharge Instructions (Addendum)
Your x-ray showed possible loculated pleural effusion but your CT imaging was clear with no evidence of pneumonia and no evidence of large PE, your symptoms are due to being positive for COVID-19, recommend symptomatic management at home, Tylenol and ibuprofen, return for any severe worsening symptoms, continue to push oral rehydration.  Recommended admission for observation as on ambulation your oxygen saturations dropped down to 91% however you declined admission and preferred to sign out AGAINST MEDICAL ADVICE.  Strongly recommend that you monitor your oxygen saturations at home in the setting of your COVID-19 infection as they can drop to dangerous levels resulting and loss of consciousness, confusion, change in mental status and cardiac arrest.  If you have persistent low oxygen saturations below 93% at home please return to the emergency department for consideration for admission for oxygen supplementation.

## 2023-10-26 NOTE — ED Notes (Signed)
The Pt decided to leave AMA. MD Lawsing aware.

## 2023-10-26 NOTE — ED Notes (Signed)
Walked patient using Dinam[employer]. Patients O2 sat started at 93% and Dropped to 91%. Patient walked from Hallway 22 bed to the main hallway bast CT2. Patient stated he was not dizzy or light headed.

## 2023-11-01 ENCOUNTER — Other Ambulatory Visit: Payer: Self-pay

## 2023-11-01 ENCOUNTER — Inpatient Hospital Stay (HOSPITAL_BASED_OUTPATIENT_CLINIC_OR_DEPARTMENT_OTHER)
Admission: EM | Admit: 2023-11-01 | Discharge: 2023-11-06 | DRG: 177 | Disposition: A | Discharge status: Home / Self Care | Payer: Medicare Other | Attending: Internal Medicine | Admitting: Internal Medicine

## 2023-11-01 ENCOUNTER — Encounter (HOSPITAL_BASED_OUTPATIENT_CLINIC_OR_DEPARTMENT_OTHER): Payer: Self-pay

## 2023-11-01 DIAGNOSIS — U071 COVID-19: Secondary | ICD-10-CM | POA: Diagnosis not present

## 2023-11-01 DIAGNOSIS — Z8261 Family history of arthritis: Secondary | ICD-10-CM | POA: Diagnosis not present

## 2023-11-01 DIAGNOSIS — T380X5A Adverse effect of glucocorticoids and synthetic analogues, initial encounter: Secondary | ICD-10-CM | POA: Diagnosis not present

## 2023-11-01 DIAGNOSIS — E1165 Type 2 diabetes mellitus with hyperglycemia: Secondary | ICD-10-CM | POA: Diagnosis not present

## 2023-11-01 DIAGNOSIS — I442 Atrioventricular block, complete: Secondary | ICD-10-CM | POA: Diagnosis present

## 2023-11-01 DIAGNOSIS — E66813 Obesity, class 3: Secondary | ICD-10-CM | POA: Diagnosis present

## 2023-11-01 DIAGNOSIS — R0602 Shortness of breath: Secondary | ICD-10-CM | POA: Diagnosis not present

## 2023-11-01 DIAGNOSIS — I10 Essential (primary) hypertension: Secondary | ICD-10-CM | POA: Diagnosis present

## 2023-11-01 DIAGNOSIS — Z950 Presence of cardiac pacemaker: Secondary | ICD-10-CM | POA: Diagnosis present

## 2023-11-01 DIAGNOSIS — G4730 Sleep apnea, unspecified: Secondary | ICD-10-CM | POA: Diagnosis present

## 2023-11-01 DIAGNOSIS — G4733 Obstructive sleep apnea (adult) (pediatric): Secondary | ICD-10-CM | POA: Diagnosis not present

## 2023-11-01 DIAGNOSIS — R0989 Other specified symptoms and signs involving the circulatory and respiratory systems: Secondary | ICD-10-CM | POA: Diagnosis not present

## 2023-11-01 DIAGNOSIS — J1282 Pneumonia due to coronavirus disease 2019: Secondary | ICD-10-CM | POA: Diagnosis not present

## 2023-11-01 DIAGNOSIS — I441 Atrioventricular block, second degree: Secondary | ICD-10-CM | POA: Diagnosis present

## 2023-11-01 DIAGNOSIS — I119 Hypertensive heart disease without heart failure: Secondary | ICD-10-CM | POA: Diagnosis present

## 2023-11-01 DIAGNOSIS — R0609 Other forms of dyspnea: Secondary | ICD-10-CM | POA: Diagnosis not present

## 2023-11-01 DIAGNOSIS — J9601 Acute respiratory failure with hypoxia: Secondary | ICD-10-CM | POA: Diagnosis not present

## 2023-11-01 DIAGNOSIS — Z801 Family history of malignant neoplasm of trachea, bronchus and lung: Secondary | ICD-10-CM | POA: Diagnosis not present

## 2023-11-01 DIAGNOSIS — E876 Hypokalemia: Secondary | ICD-10-CM | POA: Diagnosis not present

## 2023-11-01 DIAGNOSIS — Z87891 Personal history of nicotine dependence: Secondary | ICD-10-CM

## 2023-11-01 DIAGNOSIS — Z6841 Body Mass Index (BMI) 40.0 and over, adult: Secondary | ICD-10-CM

## 2023-11-01 DIAGNOSIS — Z800 Family history of malignant neoplasm of digestive organs: Secondary | ICD-10-CM

## 2023-11-01 DIAGNOSIS — N179 Acute kidney failure, unspecified: Secondary | ICD-10-CM | POA: Diagnosis present

## 2023-11-01 DIAGNOSIS — Z79899 Other long term (current) drug therapy: Secondary | ICD-10-CM | POA: Diagnosis not present

## 2023-11-01 DIAGNOSIS — J984 Other disorders of lung: Secondary | ICD-10-CM | POA: Diagnosis not present

## 2023-11-01 DIAGNOSIS — J189 Pneumonia, unspecified organism: Secondary | ICD-10-CM

## 2023-11-01 DIAGNOSIS — Z8616 Personal history of COVID-19: Secondary | ICD-10-CM | POA: Diagnosis not present

## 2023-11-01 DIAGNOSIS — R918 Other nonspecific abnormal finding of lung field: Secondary | ICD-10-CM | POA: Diagnosis not present

## 2023-11-01 DIAGNOSIS — E1169 Type 2 diabetes mellitus with other specified complication: Secondary | ICD-10-CM

## 2023-11-01 DIAGNOSIS — R0603 Acute respiratory distress: Principal | ICD-10-CM

## 2023-11-01 NOTE — ED Triage Notes (Signed)
 Pt was diagnosed with Covid on 12/28 and has had progressively worsening of SOB and decreased O2 sats today. Pt reports lowest resting O2 sat that he was able to measure at home was 83% and was approx 1 hour ago.

## 2023-11-01 NOTE — ED Triage Notes (Signed)
 Marland Kitchen

## 2023-11-01 NOTE — ED Notes (Signed)
 Pt taken to room from triage

## 2023-11-02 ENCOUNTER — Emergency Department (HOSPITAL_BASED_OUTPATIENT_CLINIC_OR_DEPARTMENT_OTHER): Payer: Medicare Other

## 2023-11-02 ENCOUNTER — Encounter (HOSPITAL_COMMUNITY): Payer: Self-pay | Admitting: Internal Medicine

## 2023-11-02 DIAGNOSIS — Z6841 Body Mass Index (BMI) 40.0 and over, adult: Secondary | ICD-10-CM | POA: Diagnosis not present

## 2023-11-02 DIAGNOSIS — Z801 Family history of malignant neoplasm of trachea, bronchus and lung: Secondary | ICD-10-CM | POA: Diagnosis not present

## 2023-11-02 DIAGNOSIS — R0602 Shortness of breath: Secondary | ICD-10-CM | POA: Diagnosis not present

## 2023-11-02 DIAGNOSIS — G4730 Sleep apnea, unspecified: Secondary | ICD-10-CM

## 2023-11-02 DIAGNOSIS — N179 Acute kidney failure, unspecified: Secondary | ICD-10-CM | POA: Diagnosis present

## 2023-11-02 DIAGNOSIS — J9601 Acute respiratory failure with hypoxia: Secondary | ICD-10-CM | POA: Diagnosis present

## 2023-11-02 DIAGNOSIS — Z950 Presence of cardiac pacemaker: Secondary | ICD-10-CM | POA: Diagnosis not present

## 2023-11-02 DIAGNOSIS — I119 Hypertensive heart disease without heart failure: Secondary | ICD-10-CM | POA: Diagnosis present

## 2023-11-02 DIAGNOSIS — E876 Hypokalemia: Secondary | ICD-10-CM | POA: Diagnosis present

## 2023-11-02 DIAGNOSIS — T380X5A Adverse effect of glucocorticoids and synthetic analogues, initial encounter: Secondary | ICD-10-CM | POA: Diagnosis not present

## 2023-11-02 DIAGNOSIS — Z79899 Other long term (current) drug therapy: Secondary | ICD-10-CM | POA: Diagnosis not present

## 2023-11-02 DIAGNOSIS — R918 Other nonspecific abnormal finding of lung field: Secondary | ICD-10-CM | POA: Diagnosis not present

## 2023-11-02 DIAGNOSIS — U071 COVID-19: Secondary | ICD-10-CM | POA: Diagnosis present

## 2023-11-02 DIAGNOSIS — J984 Other disorders of lung: Secondary | ICD-10-CM | POA: Diagnosis not present

## 2023-11-02 DIAGNOSIS — I442 Atrioventricular block, complete: Secondary | ICD-10-CM | POA: Diagnosis present

## 2023-11-02 DIAGNOSIS — I441 Atrioventricular block, second degree: Secondary | ICD-10-CM | POA: Diagnosis present

## 2023-11-02 DIAGNOSIS — I10 Essential (primary) hypertension: Secondary | ICD-10-CM | POA: Diagnosis not present

## 2023-11-02 DIAGNOSIS — R0609 Other forms of dyspnea: Secondary | ICD-10-CM | POA: Diagnosis not present

## 2023-11-02 DIAGNOSIS — Z87891 Personal history of nicotine dependence: Secondary | ICD-10-CM | POA: Diagnosis not present

## 2023-11-02 DIAGNOSIS — E66813 Obesity, class 3: Secondary | ICD-10-CM | POA: Diagnosis present

## 2023-11-02 DIAGNOSIS — J1282 Pneumonia due to coronavirus disease 2019: Secondary | ICD-10-CM | POA: Diagnosis present

## 2023-11-02 DIAGNOSIS — Z8616 Personal history of COVID-19: Secondary | ICD-10-CM | POA: Diagnosis not present

## 2023-11-02 DIAGNOSIS — Z800 Family history of malignant neoplasm of digestive organs: Secondary | ICD-10-CM | POA: Diagnosis not present

## 2023-11-02 DIAGNOSIS — R0989 Other specified symptoms and signs involving the circulatory and respiratory systems: Secondary | ICD-10-CM | POA: Diagnosis not present

## 2023-11-02 DIAGNOSIS — G4733 Obstructive sleep apnea (adult) (pediatric): Secondary | ICD-10-CM | POA: Diagnosis present

## 2023-11-02 DIAGNOSIS — Z8261 Family history of arthritis: Secondary | ICD-10-CM | POA: Diagnosis not present

## 2023-11-02 DIAGNOSIS — E1165 Type 2 diabetes mellitus with hyperglycemia: Secondary | ICD-10-CM | POA: Diagnosis not present

## 2023-11-02 LAB — BASIC METABOLIC PANEL
Anion gap: 13 (ref 5–15)
Anion gap: 15 (ref 5–15)
BUN: 26 mg/dL — ABNORMAL HIGH (ref 8–23)
BUN: 27 mg/dL — ABNORMAL HIGH (ref 8–23)
CO2: 24 mmol/L (ref 22–32)
CO2: 23 mmol/L (ref 22–32)
Calcium: 7.7 mg/dL — ABNORMAL LOW (ref 8.9–10.3)
Calcium: 8 mg/dL — ABNORMAL LOW (ref 8.9–10.3)
Chloride: 97 mmol/L — ABNORMAL LOW (ref 98–111)
Chloride: 98 mmol/L (ref 98–111)
Creatinine, Ser: 1.48 mg/dL — ABNORMAL HIGH (ref 0.61–1.24)
Creatinine, Ser: 1.38 mg/dL — ABNORMAL HIGH (ref 0.61–1.24)
GFR, Estimated: 52 mL/min — ABNORMAL LOW (ref >60)
GFR, Estimated: 56 mL/min — ABNORMAL LOW (ref >60)
Glucose, Bld: 120 mg/dL — ABNORMAL HIGH (ref 70–99)
Glucose, Bld: 181 mg/dL — ABNORMAL HIGH (ref 70–99)
Potassium: 3.1 mmol/L — ABNORMAL LOW (ref 3.5–5.1)
Potassium: 3.5 mmol/L (ref 3.5–5.1)
Sodium: 134 mmol/L — ABNORMAL LOW (ref 135–145)
Sodium: 136 mmol/L (ref 135–145)

## 2023-11-02 LAB — CBC WITH DIFFERENTIAL/PLATELET
Abs Immature Granulocytes: 0.03 10*3/uL (ref 0.00–0.07)
Basophils Absolute: 0 10*3/uL (ref 0.0–0.1)
Basophils Relative: 0 %
Eosinophils Absolute: 0.1 10*3/uL (ref 0.0–0.5)
Eosinophils Relative: 2 %
HCT: 40.3 % (ref 39.0–52.0)
Hemoglobin: 14 g/dL (ref 13.0–17.0)
Immature Granulocytes: 1 %
Lymphocytes Relative: 19 %
Lymphs Abs: 1 10*3/uL (ref 0.7–4.0)
MCH: 28.4 pg (ref 26.0–34.0)
MCHC: 34.7 g/dL (ref 30.0–36.0)
MCV: 81.7 fL (ref 80.0–100.0)
Monocytes Absolute: 0.4 10*3/uL (ref 0.1–1.0)
Monocytes Relative: 9 %
Neutro Abs: 3.5 10*3/uL (ref 1.7–7.7)
Neutrophils Relative %: 69 %
Platelets: 144 10*3/uL — ABNORMAL LOW (ref 150–400)
RBC: 4.93 MIL/uL (ref 4.22–5.81)
RDW: 13.8 % (ref 11.5–15.5)
WBC: 5.1 10*3/uL (ref 4.0–10.5)
nRBC: 0 % (ref 0.0–0.2)

## 2023-11-02 LAB — HIV ANTIBODY (ROUTINE TESTING W REFLEX): HIV Screen 4th Generation wRfx: NONREACTIVE

## 2023-11-02 LAB — BRAIN NATRIURETIC PEPTIDE: B Natriuretic Peptide: 14.4 pg/mL (ref 0.0–100.0)

## 2023-11-02 LAB — TROPONIN I (HIGH SENSITIVITY)
Troponin I (High Sensitivity): 13 ng/L (ref <18)
Troponin I (High Sensitivity): 16 ng/L (ref <18)

## 2023-11-02 LAB — MAGNESIUM: Magnesium: 2.3 mg/dL (ref 1.7–2.4)

## 2023-11-02 LAB — C-REACTIVE PROTEIN: CRP: 8.7 mg/dL — ABNORMAL HIGH (ref <1.0)

## 2023-11-02 LAB — PROCALCITONIN: Procalcitonin: <0.1 ng/mL

## 2023-11-02 MED ORDER — ACETAMINOPHEN 650 MG RE SUPP
650.0000 mg | Freq: Four times a day (QID) | RECTAL | Status: DC | PRN
Start: 2023-11-02 — End: 2023-11-06

## 2023-11-02 MED ORDER — DEXAMETHASONE SODIUM PHOSPHATE 10 MG/ML IJ SOLN
6.0000 mg | INTRAMUSCULAR | Status: DC
  Administered 2023-11-02: 6 mg via INTRAVENOUS
  Filled 2023-11-02: qty 1

## 2023-11-02 MED ORDER — POTASSIUM CHLORIDE CRYS ER 20 MEQ PO TBCR
40.0000 meq | EXTENDED_RELEASE_TABLET | Freq: Once | ORAL | Status: AC
  Administered 2023-11-02: 40 meq via ORAL
  Filled 2023-11-02: qty 2

## 2023-11-02 MED ORDER — METHYLPREDNISOLONE SODIUM SUCC 125 MG IJ SOLR: 62.5000 mg | Freq: Once | INTRAMUSCULAR | Status: DC

## 2023-11-02 MED ORDER — SODIUM CHLORIDE 0.9 % IV SOLN
1.0000 g | Freq: Once | INTRAVENOUS | Status: AC
  Administered 2023-11-02: 1 g via INTRAVENOUS
  Filled 2023-11-02: qty 10

## 2023-11-02 MED ORDER — METHYLPREDNISOLONE SODIUM SUCC 125 MG IJ SOLR
125.0000 mg | Freq: Once | INTRAMUSCULAR | Status: AC
  Administered 2023-11-02: 125 mg via INTRAVENOUS
  Filled 2023-11-02: qty 2

## 2023-11-02 MED ORDER — SODIUM CHLORIDE 0.9 % IV SOLN
500.0000 mg | Freq: Once | INTRAVENOUS | Status: AC
  Administered 2023-11-02: 500 mg via INTRAVENOUS
  Filled 2023-11-02: qty 5

## 2023-11-02 MED ORDER — SODIUM CHLORIDE 0.9% FLUSH
3.0000 mL | Freq: Two times a day (BID) | INTRAVENOUS | Status: DC
  Administered 2023-11-02 – 2023-11-06 (×8): 3 mL via INTRAVENOUS

## 2023-11-02 MED ORDER — SODIUM CHLORIDE 0.9 % IV BOLUS
1000.0000 mL | Freq: Once | INTRAVENOUS | Status: AC
  Administered 2023-11-02: 1000 mL via INTRAVENOUS

## 2023-11-02 MED ORDER — POLYETHYLENE GLYCOL 3350 17 G PO PACK: 17.0000 g | PACK | Freq: Every day | ORAL | Status: DC | PRN

## 2023-11-02 MED ORDER — ONDANSETRON HCL 4 MG/2ML IJ SOLN
4.0000 mg | Freq: Once | INTRAMUSCULAR | Status: AC
  Administered 2023-11-02: 4 mg via INTRAVENOUS
  Filled 2023-11-02: qty 2

## 2023-11-02 MED ORDER — ENOXAPARIN SODIUM 80 MG/0.8ML IJ SOSY
70.0000 mg | PREFILLED_SYRINGE | INTRAMUSCULAR | Status: DC
Start: 2023-11-02 — End: 2023-11-03
  Administered 2023-11-02: 70 mg via SUBCUTANEOUS
  Filled 2023-11-02 (×2): qty 0.8

## 2023-11-02 MED ORDER — ACETAMINOPHEN 325 MG PO TABS: 650.0000 mg | ORAL_TABLET | Freq: Four times a day (QID) | ORAL | Status: DC | PRN

## 2023-11-02 MED ORDER — IPRATROPIUM-ALBUTEROL 0.5-2.5 (3) MG/3ML IN SOLN
3.0000 mL | Freq: Once | RESPIRATORY_TRACT | Status: AC
  Administered 2023-11-02: 3 mL via RESPIRATORY_TRACT
  Filled 2023-11-02: qty 3

## 2023-11-02 NOTE — ED Provider Notes (Signed)
 [place] [facility] Provider Note  CSN: [id number] Arrival date & time: 11/01/23 2340  Chief Complaint(s) Shortness of Breath  HPI Juan Hanson is a 68 y.o. male with past medical history as below, significant for OSA on CPAP with appropriate adherence, PPM, obesity, hypertension who presents to the ED with complaint of dyspnea  He was seen in the ER 12/28 for dyspnea, diagnosed COVID-19.  Symptoms improved with nebulized breathing treatments and steroids.  He had CT imaging of the chest which did not show PE but there was significant motion artifact.  He was found with positive COVID-19.  With ambulatory pulse ox he did desaturate to 91% on room air.  He was recommended for admission but he left AGAINST MEDICAL ADVICE.  Returns today with progressive worsening symptoms.  Difficulty with ambulation secondary to severe dyspnea.  Checked his pulse ox at home after brief ambulation and it was 81 to 83%.  He feels tachypneic and lightheaded with exertion walking.  Ongoing coughing.  No chest pain.  Feels his leg edema is improved from baseline.  He has been using his CPAP at nighttime but having difficulty sleeping secondary to his URI symptoms  Past Medical History Past Medical History:  Diagnosis Date   AV block, 2nd degree    /notes 12/18/2016   Headache    had them when I woke up before getting my CPAP; never had migraines (12/18/2016)   HTN (hypertension)    OSA on CPAP    Pneumonia ~ 2012   Presence of permanent cardiac pacemaker    Right bundle branch block    /notes 12/18/2016   Patient Active Problem List   Diagnosis Date Noted   Acute respiratory failure with hypoxia (HCC) 11/02/2023   Pacemaker lead failure, sequela 02/13/2022   Essential hypertension 09/30/2018   Inclusion cyst 04/15/2018   Skin nodule 01/07/2018   Pacemaker 01/01/2017   Mobitz type 2 second degree atrioventricular block 12/18/2016   Morbid obesity (HCC) 12/13/2016   Sleep apnea in  adult 12/13/2016   Hypertensive heart disease    Home Medication(s) Prior to Admission medications   Medication Sig Start Date End Date Taking? Authorizing Provider  acetaminophen  (TYLENOL ) 325 MG tablet Take 1-2 tablets (325-650 mg total) by mouth every 4 (four) hours as needed for mild pain. 02/14/22  Yes Lesia Ozell Barter, PA-C  amLODipine  (NORVASC ) 10 MG tablet Take 10 mg by mouth daily. 01/03/18  Yes [provider]  losartan  (COZAAR ) 100 MG tablet Take 100 mg by mouth daily. 09/09/18  Yes [provider]  RSV vaccine recomb adjuvanted (AREXVY ) 120 MCG/0.5ML injection Inject into the muscle. Patient not taking: Reported on 11/02/2023 10/25/23                                                                                                                                       Past Surgical History Past  Surgical History:  Procedure Laterality Date   COLONOSCOPY  X 2   INSERT / REPLACE / REMOVE PACEMAKER  12/18/2016   PACEMAKER IMPLANT N/A 12/18/2016   Procedure: Pacemaker Implant;  Surgeon: Danelle LELON Birmingham, MD;  Location: [facility] INVASIVE CV LAB;  Service: Cardiovascular;  Laterality: N/A;   PACEMAKER LEAD REMOVAL N/A 02/13/2022   Procedure: PACEMAKER LEAD REMOVAL, PACEMAKER LEAD INCERTION, PACEMAKER GENERATOR CHANGE;  Surgeon: Birmingham Danelle LELON, MD;  Location: [facility] OR;  Service: Cardiovascular;  Laterality: N/A;   Family History Family History  Problem Relation Age of Onset   Lung cancer Mother    Arthritis Mother    Colon cancer Father    Lung cancer Father    Healthy Brother        x3   Healthy Sister     Social History Social History   Tobacco Use   Smoking status: Never   Smokeless tobacco: Former    Types: Snuff    Quit date: 06/11/2016  Vaping Use   Vaping status: Never Used  Substance Use Topics   Alcohol use: Yes    Alcohol/week: 0.0 standard drinks of alcohol    Comment: 12/18/2016 <10 drinks/year   Drug use: No   Allergies Patient has no allergy  information on record.  Review of Systems Review of Systems  Constitutional:  Positive for chills, fatigue and fever.  Respiratory:  Positive for cough, chest tightness and shortness of breath.   Cardiovascular:  Negative for chest pain, palpitations and leg swelling.  Gastrointestinal:  Negative for abdominal pain and diarrhea.  Genitourinary:  Negative for difficulty urinating and dysuria.  Musculoskeletal:  Positive for arthralgias.  Skin:  Negative for rash.  Neurological:  Positive for light-headedness. Negative for syncope.    Physical Exam Vital Signs  I have reviewed the triage vital signs BP 121/73 (BP Location: Right Arm)   Pulse 60   Temp 97.6 F (36.4 C) (Oral)   Resp 20   Ht 6' 4 (1.93 m)   Wt (!) 156 kg   SpO2 91%   BMI 41.86 kg/m  Physical Exam Vitals and nursing note reviewed.  Constitutional:      General: He is not in acute distress.    Appearance: He is well-developed. He is obese. He is not ill-appearing.  HENT:     Head: Normocephalic and atraumatic.     Right Ear: External ear normal.     Left Ear: External ear normal.     Mouth/Throat:     Mouth: Mucous membranes are moist.  Eyes:     General: No scleral icterus. Cardiovascular:     Rate and Rhythm: Normal rate and regular rhythm.     Pulses: Normal pulses.     Heart sounds: Normal heart sounds.  Pulmonary:     Effort: Pulmonary effort is normal. Tachypnea present. No respiratory distress.     Breath sounds: Decreased breath sounds present.     Comments: Coarse bilateral Chest:    Abdominal:     General: Abdomen is flat.     Palpations: Abdomen is soft.     Tenderness: There is no abdominal tenderness.  Musculoskeletal:     Cervical back: No rigidity.     Right lower leg: Edema present.     Left lower leg: Edema present.     Comments: Trace LE edema bilateral symmetric  Skin:    General: Skin is warm and dry.     Capillary Refill: Capillary refill takes less than 2  seconds.   Neurological:     Mental Status: He is alert.  Psychiatric:        Mood and Affect: Mood normal.        Behavior: Behavior normal.     ED Results and Treatments Labs (all labs ordered are listed, but only abnormal results are displayed) Labs Reviewed  CBC WITH DIFFERENTIAL/PLATELET - Abnormal; Notable for the following components:      Result Value   Platelets 144 (*)    All other components within normal limits  BASIC METABOLIC PANEL - Abnormal; Notable for the following components:   Sodium 134 (*)    Potassium 3.1 (*)    Chloride 97 (*)    Glucose, Bld 120 (*)    BUN 26 (*)    Creatinine, Ser 1.48 (*)    Calcium  7.7 (*)    GFR, Estimated 52 (*)    All other components within normal limits  C-REACTIVE PROTEIN - Abnormal; Notable for the following components:   CRP 8.7 (*)    All other components within normal limits  BASIC METABOLIC PANEL - Abnormal; Notable for the following components:   Glucose, Bld 181 (*)    BUN 27 (*)    Creatinine, Ser 1.38 (*)    Calcium  8.0 (*)    GFR, Estimated 56 (*)    All other components within normal limits  COMPREHENSIVE METABOLIC PANEL - Abnormal; Notable for the following components:   Glucose, Bld 209 (*)    BUN 31 (*)    Creatinine, Ser 1.31 (*)    Calcium  7.9 (*)    Albumin 3.1 (*)    All other components within normal limits  BRAIN NATRIURETIC PEPTIDE  HIV ANTIBODY (ROUTINE TESTING W REFLEX)  PROCALCITONIN  MAGNESIUM  CBC  PROCALCITONIN  D-DIMER, QUANTITATIVE  BRAIN NATRIURETIC PEPTIDE  HEMOGLOBIN A1C  TROPONIN I (HIGH SENSITIVITY)  TROPONIN I (HIGH SENSITIVITY)                                                                                                                          Radiology No results found.   Pertinent labs & imaging results that were available during my care of the patient were reviewed by me and considered in my medical decision making (see MDM for details).  Medications Ordered in  ED Medications  enoxaparin  (LOVENOX ) injection 70 mg (70 mg Subcutaneous Given 11/02/23 1945)  sodium chloride  flush (NS) 0.9 % injection 3 mL (3 mLs Intravenous Given 11/02/23 2143)  acetaminophen  (TYLENOL ) tablet 650 mg (has no administration in time range)    Or  acetaminophen  (TYLENOL ) suppository 650 mg (has no administration in time range)  polyethylene glycol (MIRALAX  / GLYCOLAX ) packet 17 g (has no administration in time range)  methylPREDNISolone  sodium succinate (SOLU-MEDROL ) 125 mg/2 mL injection 60 mg (has no administration in time range)  pantoprazole  (PROTONIX ) EC tablet 40 mg (has no administration in time range)  ondansetron  (ZOFRAN ) injection 4 mg (has no administration  in time range)  ipratropium-albuterol  (DUONEB) 0.5-2.5 (3) MG/3ML nebulizer solution 3 mL (has no administration in time range)  insulin  aspart (novoLOG ) injection 0-9 Units (has no administration in time range)  cefTRIAXone  (ROCEPHIN ) 1 g in sodium chloride  0.9 % 100 mL IVPB (0 g Intravenous Stopped 11/02/23 0234)  azithromycin  (ZITHROMAX ) 500 mg in sodium chloride  0.9 % 250 mL IVPB (0 mg Intravenous Stopped 11/02/23 0234)  ipratropium-albuterol  (DUONEB) 0.5-2.5 (3) MG/3ML nebulizer solution 3 mL (3 mLs Nebulization Given 11/02/23 0117)  sodium chloride  0.9 % bolus 1,000 mL (0 mLs Intravenous Stopped 11/02/23 0326)  methylPREDNISolone  sodium succinate (SOLU-MEDROL ) 125 mg/2 mL injection 125 mg (125 mg Intravenous Given 11/02/23 0107)  ondansetron  (ZOFRAN ) injection 4 mg (4 mg Intravenous Given 11/02/23 0213)  potassium chloride  SA (KLOR-CON  M) CR tablet 40 mEq (40 mEq Oral Given 11/02/23 1758)                                                                                                                                     Procedures .Critical Care  Performed by: Elnor Jayson LABOR, DO Authorized by: Elnor Jayson LABOR, DO   Critical care provider statement:    Critical care time (minutes):  44   Critical care time was exclusive  of:  Separately billable procedures and treating other patients   Critical care was necessary to treat or prevent imminent or life-threatening deterioration of the following conditions:  Respiratory failure   Critical care was time spent personally by me on the following activities:  Development of treatment plan with patient or surrogate, discussions with consultants, evaluation of patient's response to treatment, examination of patient, ordering and review of laboratory studies, ordering and review of radiographic studies, ordering and performing treatments and interventions, pulse oximetry, re-evaluation of patient's condition, review of old charts and obtaining history from patient or surrogate   Care discussed with: admitting provider     (including critical care time)  Medical Decision Making / ED Course    Medical Decision Making:    Juan Hanson is a 68 y.o. male with past medical history as below, significant for OSA on CPAP with appropriate adherence, PPM, obesity, hypertension who presents to the ED with complaint of dyspnea. The complaint involves an extensive differential diagnosis and also carries with it a high risk of complications and morbidity.  Serious etiology was considered. Ddx includes but is not limited to: In my evaluation of this patient's dyspnea my DDx includes, but is not limited to, pneumonia, pulmonary embolism, pneumothorax, pulmonary edema, metabolic acidosis, asthma, COPD, cardiac cause, anemia, anxiety, etc.    Complete initial physical exam performed, notably the patient was in hypoxic on room air with tachypnea increased work of breathing.  Placed on 2 L nasal cannula improvement in respiratory status..    Reviewed and confirmed nursing documentation for past medical history, family history, social history.  Vital signs reviewed.  Clinical Course as of 11/03/23 0753  Sat Nov 02, 2023  0122 Creatinine(!): 1.48 Aki, give ivf [SG]  0123 BUN(!):  26 Aki likely pre-renal, he reports poor PO last week or so [SG]  0123 Positive COVID19 on 12/28, will not repeat [SG]    Clinical Course User Index [SG] Elnor Jayson LABOR, DO    Brief summary: 68 year old male history as above here with worsening difficulty breathing.  Recent diagnosed COVID-19 following ER visit.  Chest x-ray is concerning for pneumonia.  Had recent positive COVID-19 testing.  Trial patient off of nasal cannula and he will desat to 87-88% when sitting still, increased work of breathing.  Will continue 2 L nasal cannula.  Start Rocephin  azithromycin  for possible developing bilateral pneumonia.  He has no leukocytosis, does not appear to be septic at this time. Give steroids given hypoxia.   Given new oxygen requirement, like secondary to COVID-19 versus concurrent bacterial pneumonia (although this seems less likely will cover for not and admitting team can de-escalate if needed) recommend admission.  He is amenable.                Additional history obtained: -Additional history obtained from na -External records from outside source obtained and reviewed including: Chart review including previous notes, labs, imaging, consultation notes including  Prior ED visit, prior labs and imaging, home medications   Lab Tests: -I ordered, reviewed, and interpreted labs.   The pertinent results include:   Labs Reviewed  CBC WITH DIFFERENTIAL/PLATELET - Abnormal; Notable for the following components:      Result Value   Platelets 144 (*)    All other components within normal limits  BASIC METABOLIC PANEL - Abnormal; Notable for the following components:   Sodium 134 (*)    Potassium 3.1 (*)    Chloride 97 (*)    Glucose, Bld 120 (*)    BUN 26 (*)    Creatinine, Ser 1.48 (*)    Calcium  7.7 (*)    GFR, Estimated 52 (*)    All other components within normal limits  C-REACTIVE PROTEIN - Abnormal; Notable for the following components:   CRP 8.7 (*)    All other  components within normal limits  BASIC METABOLIC PANEL - Abnormal; Notable for the following components:   Glucose, Bld 181 (*)    BUN 27 (*)    Creatinine, Ser 1.38 (*)    Calcium  8.0 (*)    GFR, Estimated 56 (*)    All other components within normal limits  COMPREHENSIVE METABOLIC PANEL - Abnormal; Notable for the following components:   Glucose, Bld 209 (*)    BUN 31 (*)    Creatinine, Ser 1.31 (*)    Calcium  7.9 (*)    Albumin 3.1 (*)    All other components within normal limits  BRAIN NATRIURETIC PEPTIDE  HIV ANTIBODY (ROUTINE TESTING W REFLEX)  PROCALCITONIN  MAGNESIUM  CBC  PROCALCITONIN  D-DIMER, QUANTITATIVE  BRAIN NATRIURETIC PEPTIDE  HEMOGLOBIN A1C  TROPONIN I (HIGH SENSITIVITY)  TROPONIN I (HIGH SENSITIVITY)    Notable for as above  EKG   EKG Interpretation Date/Time:  Saturday November 02 2023 00:14:18 EST Ventricular Rate:  68 PR Interval:  186 QRS Duration:  192 QT Interval:  513 QTC Calculation: 546 R Axis:   -88  Text Interpretation: Sinus rhythm Nonspecific IVCD with LAD Left ventricular hypertrophy ATRIAL PACED RHYTHM Confirmed by Elnor Jayson ([id number]) on 11/02/2023 12:17:01 AM  Imaging Studies ordered: I ordered imaging studies including chest x-ray I independently visualized the following imaging with scope of interpretation limited to determining acute life threatening conditions related to emergency care; findings noted above I independently visualized and interpreted imaging. I agree with the radiologist interpretation   Medicines ordered and prescription drug management: Meds ordered this encounter  Medications   cefTRIAXone  (ROCEPHIN ) 1 g in sodium chloride  0.9 % 100 mL IVPB    Antibiotic Indication::   CAP   azithromycin  (ZITHROMAX ) 500 mg in sodium chloride  0.9 % 250 mL IVPB    Antibiotic Indication::   CAP   ipratropium-albuterol  (DUONEB) 0.5-2.5 (3) MG/3ML nebulizer solution 3 mL   sodium chloride  0.9 % bolus 1,000 mL    DISCONTD: methylPREDNISolone  sodium succinate (SOLU-MEDROL ) 125 mg/2 mL injection 62.5 mg   methylPREDNISolone  sodium succinate (SOLU-MEDROL ) 125 mg/2 mL injection 125 mg   ondansetron  (ZOFRAN ) injection 4 mg   enoxaparin  (LOVENOX ) injection 70 mg   sodium chloride  flush (NS) 0.9 % injection 3 mL   OR Linked Order Group    acetaminophen  (TYLENOL ) tablet 650 mg    acetaminophen  (TYLENOL ) suppository 650 mg   polyethylene glycol (MIRALAX  / GLYCOLAX ) packet 17 g   DISCONTD: dexamethasone  (DECADRON ) injection 6 mg   potassium chloride  SA (KLOR-CON  M) CR tablet 40 mEq   methylPREDNISolone  sodium succinate (SOLU-MEDROL ) 125 mg/2 mL injection 60 mg   pantoprazole  (PROTONIX ) EC tablet 40 mg   ondansetron  (ZOFRAN ) injection 4 mg   ipratropium-albuterol  (DUONEB) 0.5-2.5 (3) MG/3ML nebulizer solution 3 mL   insulin  aspart (novoLOG ) injection 0-9 Units    Correction coverage::   Sensitive (thin, NPO, renal)    CBG < 70::   Implement Hypoglycemia Standing Orders and refer to Hypoglycemia Standing Orders sidebar report    CBG 70 - 120::   0 units    CBG 121 - 150::   1 unit    CBG 151 - 200::   2 units    CBG 201 - 250::   3 units    CBG 251 - 300::   5 units    CBG 301 - 350::   7 units    CBG 351 - 400:   9 units    CBG > 400:   call MD and obtain STAT lab verification    -I have reviewed the patients home medicines and have made adjustments as needed   Consultations Obtained: na   Cardiac Monitoring: The patient was maintained on a cardiac monitor.  I personally viewed and interpreted the cardiac monitored which showed an underlying rhythm of: pacd Continuous pulse oximetry interpreted by myself, 96% on 2L.    Social Determinants of Health:  Diagnosis or treatment significantly limited by social determinants of health: former smoker and obesity   Reevaluation: After the interventions noted above, I reevaluated the patient and found that they have improved  Co morbidities that  complicate the patient evaluation  Past Medical History:  Diagnosis Date   AV block, 2nd degree    /notes 12/18/2016   Headache    had them when I woke up before getting my CPAP; never had migraines (12/18/2016)   HTN (hypertension)    OSA on CPAP    Pneumonia ~ 2012   Presence of permanent cardiac pacemaker    Right bundle branch block    /notes 12/18/2016      Dispostion: Disposition decision including need for hospitalization was considered, and patient admitted to the hospital.    Final Clinical  Impression(s) / ED Diagnoses Final diagnoses:  Respiratory distress  COVID-19  Multifocal pneumonia  AKI (acute kidney injury) (HCC)        Elnor Jayson LABOR, DO 11/03/23 [phone]

## 2023-11-02 NOTE — Progress Notes (Signed)
Placed patient on CPAP for the night with oxygen set at 4lpm ° °

## 2023-11-02 NOTE — ED Notes (Signed)
Juan Hanson with [facility] called for transport

## 2023-11-02 NOTE — ED Notes (Signed)
 Patient placed on 1l/min for sats< 94%

## 2023-11-02 NOTE — H&P (Signed)
 History and Physical   Juan Hanson FMW:[id number] DOB: 1955/12/06 DOA: 11/01/2023  PCP: Seabron Lenis, MD   Patient coming from: Home  Chief Complaint: Shortness of breath  HPI: Juan Hanson is a 68 y.o. male with medical history significant of hypertension, secondary AV block type II, status post pacemaker, obesity, OSA presenting with worsening shortness of breath.  Patient was initially evaluated in the ED on 12/28 and was diagnosed with COVID-19.  Had improvement in symptoms with steroids and nebulizer treatment at that time.  Was saturating around 91% with ambulation but declined recommended admission and left AMA from the ED.  Has had progressive shortness of breath since that time especially dyspnea on exertion now with lightheadedness on after exertion as well.  States there has been some waxing and waning to his symptoms in this time period as well but injury or gradually worsening symptoms without any acute changes. Noted to desaturate into the low 80s on his home pulse ox after ambulating.  Continued cough and difficulty sleeping reported as well.  Denies fevers, chills, chest pain, abdominal pain, constipation, diarrhea, nausea, vomiting.  ED Course: Vital signs in ED notable for blood pressure in the 100s 150 systolic, heart rate in the 50s to 70s, respiratory rate in the teens to 20s.  Review of Systems: As per HPI otherwise all other systems reviewed and are negative.  Past Medical History:  Diagnosis Date   AV block, 2nd degree    /notes 12/18/2016   Headache    had them when I woke up before getting my CPAP; never had migraines (12/18/2016)   HTN (hypertension)    OSA on CPAP    Pneumonia ~ 2012   Presence of permanent cardiac pacemaker    Right bundle branch block    /notes 12/18/2016    Past Surgical History:  Procedure Laterality Date   COLONOSCOPY  X 2   INSERT / REPLACE / REMOVE PACEMAKER  12/18/2016   PACEMAKER IMPLANT N/A 12/18/2016   Procedure:  Pacemaker Implant;  Surgeon: Danelle LELON Birmingham, MD;  Location: [facility];  Service: Cardiovascular;  Laterality: N/A;   PACEMAKER LEAD REMOVAL N/A 02/13/2022   Procedure: PACEMAKER LEAD REMOVAL, PACEMAKER LEAD INCERTION, PACEMAKER GENERATOR CHANGE;  Surgeon: Birmingham Danelle LELON, MD;  Location: [facility] OR;  Service: Cardiovascular;  Laterality: N/A;    Social History  reports that he has never smoked. He quit smokeless tobacco use about 7 years ago.  His smokeless tobacco use included snuff. He reports current alcohol use. He reports that he does not use drugs.  No Known Allergies  Family History  Problem Relation Age of Onset   Lung cancer Mother    Arthritis Mother    Colon cancer Father    Lung cancer Father    Healthy Brother        x3   Healthy Sister   Reviewed on admission  Prior to Admission medications   Medication Sig Start Date End Date Taking? Authorizing Provider  acetaminophen  (TYLENOL ) 325 MG tablet Take 1-2 tablets (325-650 mg total) by mouth every 4 (four) hours as needed for mild pain. 02/14/22  Yes Lesia Ozell Barter, PA-C  amLODipine  (NORVASC ) 10 MG tablet Take 10 mg by mouth daily. 01/03/18  Yes [provider]  losartan  (COZAAR ) 100 MG tablet Take 100 mg by mouth daily. 09/09/18  Yes [provider]  RSV vaccine recomb adjuvanted (AREXVY ) 120 MCG/0.5ML injection Inject into the muscle. Patient not taking: Reported on 11/02/2023  10/25/23       Physical Exam: Vitals:   11/02/23 1314 11/02/23 1315 11/02/23 1330 11/02/23 1518  BP: (!) 140/77 132/86 132/82 130/68  Pulse:  (!) 59 60 60  Resp:  14 (!) 25 16  Temp:    97.7 F (36.5 C)  TempSrc:    Oral  SpO2:  91% 90% 93%    Physical Exam Constitutional:      General: He is not in acute distress.    Appearance: Normal appearance. He is obese.  HENT:     Head: Normocephalic and atraumatic.     Mouth/Throat:     Mouth: Mucous membranes are moist.     Pharynx: Oropharynx is clear.  Eyes:      Extraocular Movements: Extraocular movements intact.     Pupils: Pupils are equal, round, and reactive to light.  Cardiovascular:     Rate and Rhythm: Normal rate and regular rhythm.     Pulses: Normal pulses.     Heart sounds: Normal heart sounds.  Pulmonary:     Effort: Pulmonary effort is normal. No respiratory distress.     Breath sounds: No wheezing.  Abdominal:     General: Bowel sounds are normal. There is no distension.     Palpations: Abdomen is soft.     Tenderness: There is no abdominal tenderness.  Musculoskeletal:        General: No swelling or deformity.  Skin:    General: Skin is warm and dry.  Neurological:     General: No focal deficit present.     Mental Status: Mental status is at baseline.    Labs on Admission: I have personally reviewed following labs and imaging studies  CBC: Recent Labs  Lab 11/02/23 0017  WBC 5.1  NEUTROABS 3.5  HGB 14.0  HCT 40.3  MCV 81.7  PLT 144*    Basic Metabolic Panel: Recent Labs  Lab 11/02/23 0046  NA 134*  K 3.1*  CL 97*  CO2 24  GLUCOSE 120*  BUN 26*  CREATININE 1.48*  CALCIUM  7.7*    GFR: CrCl cannot be calculated (Unknown ideal weight.).  Liver Function Tests: No results for input(s): AST, ALT, ALKPHOS, BILITOT, PROT, ALBUMIN in the last 168 hours.  Urine analysis: No results found for: COLORURINE, APPEARANCEUR, LABSPEC, PHURINE, GLUCOSEU, HGBUR, BILIRUBINUR, KETONESUR, PROTEINUR, UROBILINOGEN, NITRITE, LEUKOCYTESUR  Radiological Exams on Admission: DG Chest Port 1 View Result Date: 11/02/2023 CLINICAL DATA:  Shortness of breath EXAM: PORTABLE CHEST 1 VIEW COMPARISON:  10/26/2023 FINDINGS: Cardiomegaly. Left pacer remains in place, unchanged. Mild vascular congestion. Patchy bilateral airspace opacities increased since prior study. No visible effusions or acute bony abnormality. IMPRESSION: Patchy bilateral airspace disease concerning for pneumonia. Cardiomegaly,  vascular congestion. Electronically Signed   By: Franky Crease M.D.   On: 11/02/2023 00:31    EKG: Independently reviewed.  Sinus rhythm at 60 beats minute.  Versus paced rhythm at 68 bpm.  Less likely paced rhythm which explains morphology and repolarization abnormality.  Evidence of LVH.  Assessment/Plan Principal Problem:   Acute respiratory failure with hypoxia (HCC) Active Problems:   Morbid obesity (HCC)   Sleep apnea in adult   Mobitz type 2 second degree atrioventricular block   Pacemaker   Essential hypertension   Acute respiratory failure with hypoxia COVID-19 pneumonia > Known COVID-19 after testing positive a week ago.  Low sats at that time though not quite hypoxic admission was recommended and patient declined. Now with worsening shortness of breath and  hypoxia requiring 2 to 3 L of supplemental oxygen.  No leukocytosis. > Patchy airspace disease consistent with pneumonia on chest x-ray.  Some evidence of vascular congestion with cardiomegaly as well but normal BNP. > Received Solu-Medrol , DuoNeb, ceftriaxone , azithromycin  in the ED.  With lack of leukocytosis suspect continued/worsening viral infection.  Reports he felt better after steroids and nebulizer treatment in the ED - Monitor on telemetry unit overnight - Continue with supplementary oxygen, wean as tolerated - Hold off on further antibiotics as this appears to be most likely viral - Will check procalcitonin to confirm viral etiology in the setting of known COVID-19 infection. - Continue with daily steroids - Trend fever curve and WBC - If fails to respond or has worsening hypoxia leading to pursue further evaluation for pulmonary embolism.  Negative CTA PE study when initially presented on 12/28 but study was limited to main pulmonary arteries due to respiratory motion artifact.  AKI Hyperkalemia > Creatinine elevated to 1.48 from previous baseline of 1.  Mild hypokalemia with potassium 3.1. > Has received a  liter of fluids in the ED thus far. - Hold further IV fluids to monitor response - Encourage p.o. intake - Hold hydrochlorothiazide  and losartan  for now - 40 mEq p.o. potassium - Check magnesium  Hypertension - Holding hydrochlorothiazide  and losartan  as above - Continue home amlodipine   Secondary AV block type II - Pacemaker in place  Obesity - Noted  OSA - Continue home CPAP  DVT prophylaxis: Lovenox  Code Status:   Full Family Communication:  None on admission  Disposition Plan:   Patient is from:  Home  Anticipated DC to:  Home  Anticipated DC date:  2 to 3 days  Anticipated DC barriers: None  Consults called:  None Admission status:  Inpatient, telemetry  Severity of Illness: The appropriate patient status for this patient is INPATIENT. Inpatient status is judged to be reasonable and necessary in order to provide the required intensity of service to ensure the patient's safety. The patient's presenting symptoms, physical exam findings, and initial radiographic and laboratory data in the context of their chronic comorbidities is felt to place them at high risk for further clinical deterioration. Furthermore, it is not anticipated that the patient will be medically stable for discharge from the hospital within 2 midnights of admission.   * I certify that at the point of admission it is my clinical judgment that the patient will require inpatient hospital care spanning beyond 2 midnights from the point of admission due to high intensity of service, high risk for further deterioration and high frequency of surveillance required.Juan Hanson Juan KATHEE Seena MD [facility]  How to contact the [facility] Attending or Consulting provider 7A - 7P or covering provider during after hours 7P -7A, for this patient?   Check the care team in [facility] and look for a) attending/consulting [facility] provider listed and b) the [facility] team listed Log into [email or web address] and use [place]'s universal password  to access. If you do not have the password, please contact the hospital operator. Locate the [facility] provider you are looking for under [facility] and page to a number that you can be directly reached. If you still have difficulty reaching the provider, please page the [facility] (Director on Call) for the Hospitalists listed on amion for assistance.  11/02/2023, 3:35 PM

## 2023-11-02 NOTE — Progress Notes (Signed)
 Plan of Care Note for accepted transfer   Patient: NYRON MOZER MRN: [id number]   DOA: 11/01/2023  Facility requesting transfer: [facility]  ER. Requesting Provider: Dr. Elnor. Reason for transfer: Acute respiratory failure in the setting of COVID infection. Facility course: 68 year old male with known history of AV block status post pacemaker placement, sleep apnea, hypertension who had come on October 26, 2023 for cough and shortness of breath was found to have COVID-pneumonia but at that time patient refused admission and left AMA presents back with shortness of breath and cough hypoxic to 2 L oxygen chest x-ray shows bilateral multifocal pneumonia.  Patient was given steroids and empiric antibiotics admitted for further management of acute respiratory failure with hypoxia in the setting of COVID-pneumonia.  Plan of care: The patient is accepted for admission to Telemetry unit, at [facility]..   Author: Redia LOISE Cleaver, MD 11/02/2023  Check [email or web address] for on-call coverage.  Nursing staff, Please call [facility] Admits & Consults System-Wide number on Amion as soon as patient's arrival, so appropriate admitting provider can evaluate the pt.

## 2023-11-02 NOTE — ED Notes (Signed)
 O2 increased to 2L/min

## 2023-11-03 DIAGNOSIS — I10 Essential (primary) hypertension: Secondary | ICD-10-CM | POA: Diagnosis not present

## 2023-11-03 DIAGNOSIS — I441 Atrioventricular block, second degree: Secondary | ICD-10-CM

## 2023-11-03 DIAGNOSIS — J9601 Acute respiratory failure with hypoxia: Secondary | ICD-10-CM | POA: Diagnosis not present

## 2023-11-03 LAB — COMPREHENSIVE METABOLIC PANEL
ALT: 39 U/L (ref 0–44)
AST: 39 U/L (ref 15–41)
Albumin: 3.1 g/dL — ABNORMAL LOW (ref 3.5–5.0)
Alkaline Phosphatase: 53 U/L (ref 38–126)
Anion gap: 13 (ref 5–15)
BUN: 31 mg/dL — ABNORMAL HIGH (ref 8–23)
CO2: 22 mmol/L (ref 22–32)
Calcium: 7.9 mg/dL — ABNORMAL LOW (ref 8.9–10.3)
Chloride: 101 mmol/L (ref 98–111)
Creatinine, Ser: 1.31 mg/dL — ABNORMAL HIGH (ref 0.61–1.24)
GFR, Estimated: >60 mL/min (ref >60)
Glucose, Bld: 209 mg/dL — ABNORMAL HIGH (ref 70–99)
Potassium: 3.7 mmol/L (ref 3.5–5.1)
Sodium: 136 mmol/L (ref 135–145)
Total Bilirubin: 0.8 mg/dL (ref 0.0–1.2)
Total Protein: 6.8 g/dL (ref 6.5–8.1)

## 2023-11-03 LAB — URINALYSIS, ROUTINE W REFLEX MICROSCOPIC
Bacteria, UA: NONE SEEN
Bilirubin Urine: NEGATIVE
Glucose, UA: NEGATIVE mg/dL
Ketones, ur: NEGATIVE mg/dL
Leukocytes,Ua: NEGATIVE
Nitrite: NEGATIVE
Protein, ur: 100 mg/dL — AB
Specific Gravity, Urine: 1.018 (ref 1.005–1.030)
pH: 5 (ref 5.0–8.0)

## 2023-11-03 LAB — CBC
HCT: 40.6 % (ref 39.0–52.0)
Hemoglobin: 14.1 g/dL (ref 13.0–17.0)
MCH: 28.3 pg (ref 26.0–34.0)
MCHC: 34.7 g/dL (ref 30.0–36.0)
MCV: 81.4 fL (ref 80.0–100.0)
Platelets: 194 10*3/uL (ref 150–400)
RBC: 4.99 MIL/uL (ref 4.22–5.81)
RDW: 13.8 % (ref 11.5–15.5)
WBC: 4.6 10*3/uL (ref 4.0–10.5)
nRBC: 0 % (ref 0.0–0.2)

## 2023-11-03 LAB — D-DIMER, QUANTITATIVE: D-Dimer, Quant: 0.63 ug{FEU}/mL — ABNORMAL HIGH (ref 0.00–0.50)

## 2023-11-03 LAB — GLUCOSE, CAPILLARY
Glucose-Capillary: 193 mg/dL — ABNORMAL HIGH (ref 70–99)
Glucose-Capillary: 198 mg/dL — ABNORMAL HIGH (ref 70–99)
Glucose-Capillary: 204 mg/dL — ABNORMAL HIGH (ref 70–99)
Glucose-Capillary: 260 mg/dL — ABNORMAL HIGH (ref 70–99)

## 2023-11-03 LAB — PROCALCITONIN: Procalcitonin: <0.1 ng/mL

## 2023-11-03 MED ORDER — PANTOPRAZOLE SODIUM 40 MG PO TBEC
40.0000 mg | DELAYED_RELEASE_TABLET | Freq: Every day | ORAL | Status: DC
  Administered 2023-11-03 – 2023-11-05 (×3): 40 mg via ORAL
  Filled 2023-11-03 (×3): qty 1

## 2023-11-03 MED ORDER — BUDESONIDE 0.25 MG/2ML IN SUSP
0.2500 mg | Freq: Two times a day (BID) | RESPIRATORY_TRACT | Status: DC
  Administered 2023-11-03 – 2023-11-06 (×7): 0.25 mg via RESPIRATORY_TRACT
  Filled 2023-11-03 (×7): qty 2

## 2023-11-03 MED ORDER — ONDANSETRON HCL 4 MG/2ML IJ SOLN: 4.0000 mg | Freq: Four times a day (QID) | INTRAMUSCULAR | Status: DC | PRN

## 2023-11-03 MED ORDER — METHYLPREDNISOLONE SODIUM SUCC 125 MG IJ SOLR
60.0000 mg | Freq: Two times a day (BID) | INTRAMUSCULAR | Status: DC
  Administered 2023-11-03 – 2023-11-05 (×5): 60 mg via INTRAVENOUS
  Filled 2023-11-03 (×5): qty 2

## 2023-11-03 MED ORDER — ENOXAPARIN SODIUM 80 MG/0.8ML IJ SOSY
80.0000 mg | PREFILLED_SYRINGE | INTRAMUSCULAR | Status: DC
  Administered 2023-11-03 – 2023-11-05 (×3): 80 mg via SUBCUTANEOUS
  Filled 2023-11-03 (×3): qty 0.8

## 2023-11-03 MED ORDER — IPRATROPIUM-ALBUTEROL 0.5-2.5 (3) MG/3ML IN SOLN: 3.0000 mL | RESPIRATORY_TRACT | Status: DC | PRN

## 2023-11-03 MED ORDER — BENZONATATE 100 MG PO CAPS: 200.0000 mg | ORAL_CAPSULE | Freq: Three times a day (TID) | ORAL | Status: DC | PRN

## 2023-11-03 MED ORDER — INSULIN ASPART 100 UNIT/ML IJ SOLN
0.0000 [IU] | Freq: Three times a day (TID) | INTRAMUSCULAR | Status: DC
  Administered 2023-11-03: 2 [IU] via SUBCUTANEOUS
  Administered 2023-11-03: 3 [IU] via SUBCUTANEOUS
  Administered 2023-11-03: 2 [IU] via SUBCUTANEOUS
  Administered 2023-11-04: 3 [IU] via SUBCUTANEOUS

## 2023-11-03 NOTE — Progress Notes (Signed)
Placed patient on CPAP for the night with oxygen set at 4lpm ° °

## 2023-11-03 NOTE — Progress Notes (Signed)
 PROGRESS NOTE        PATIENT DETAILS Name: Juan Hanson Age: 68 y.o. Sex: male Date of Birth: [birth year] Admit Date: 11/01/2023 Admitting Physician Redia LOISE Cleaver, MD ERE:[id number], Alm, MD  Brief Summary: Patient is a 68 y.o.  male with history of HTN, complete heart block-s/p PPM implantation, OSA on CPAP, morbid obesity-who was diagnosed with COVID-19 infection on 12/28-presented with worsening shortness of breath-was found to have acute hypoxic respiratory failure secondary to COVID-19 pneumonia and subsequently admitted to the hospitalist service.  Significant events: 12/28>> ED visit-COVID-19 PCR+ve-left AMA 01/03>> presented with shortness of breath-admit to [facility]  Significant studies: 12/28>> CTA chest: No PE 01/04>> CXR: Patchy infiltrates bilaterally  Significant microbiology data: 12/28>> COVID PCR: Positive 12/28>> influenza/RSV PCR: Negative  Procedures: None  Consults: None  Subjective: Feels better after receiving steroids-down to 5 L of oxygen.  Objective: Vitals: Blood pressure (!) 128/102, pulse 60, temperature 97.6 F (36.4 C), temperature source Oral, resp. rate 14, height 6' 4 (1.93 m), weight (!) 156 kg, SpO2 90%.   Exam: Gen Exam:Alert awake-not in any distress HEENT:atraumatic, normocephalic Chest: B/L clear to auscultation anteriorly CVS:S1S2 regular Abdomen:soft non tender, non distended Extremities:+ edema Neurology: Non focal Skin: no rash  Pertinent Labs/Radiology:    Latest Ref Rng & Units 11/03/2023    5:33 AM 11/02/2023   12:17 AM 10/26/2023    5:29 AM  CBC  WBC 4.0 - 10.5 K/uL 4.6  5.1  6.3   Hemoglobin 13.0 - 17.0 g/dL 85.8  85.9  86.3   Hematocrit 39.0 - 52.0 % 40.6  40.3  40.7   Platelets 150 - 400 K/uL 194  144  182     Lab Results  Component Value Date   NA 136 11/03/2023   K 3.7 11/03/2023   CL 101 11/03/2023   CO2 22 11/03/2023      Assessment/Plan: Acute hypoxic respiratory  failure due to COVID-19 PNA Slowly improving Down to 5 L of oxygen-appears comfortable Continue steroids-will switch to Solu-Medrol  If worsens-will require Actemra. Has mild leg edema-holding off Lasix  today but will check echo Incentive spirometry/flutter valve Mobilize with nursing staff  AKI Suspect hemodynamically mediated in the setting of COVID-19 infection/losartan  use Will check UA Improving with supportive care Trend electrolytes  HTN BP stable-all antihypertensives on hold  History of complete heart block-PPM in place Telemetry monitoring  Steroid-induced hyperglycemia Check A1c SSI while on steroids  OSA CPAP nightly-he is trying to get one of his friends to bring him his home mask.  He found hospital mask uncomfortable.  Morbid Obesity: Estimated body mass index is 41.86 kg/m as calculated from the following:   Height as of this encounter: 6' 4 (1.93 m).   Weight as of this encounter: 156 kg.   Code status:   Code Status: Full Code   DVT Prophylaxis:SQ Lovenox     Family Communication: None at bedside   Disposition Plan: Status is: Inpatient Remains inpatient appropriate because: Severity of illness   Planned Discharge Destination:Home   Diet: Diet Order             Diet Heart Room service appropriate? Yes; Fluid consistency: Thin  Diet effective now                     Antimicrobial agents: Anti-infectives (From admission, onward)  Start     Dose/Rate Route Frequency Ordered Stop   11/02/23 0045  cefTRIAXone  (ROCEPHIN ) 1 g in sodium chloride  0.9 % 100 mL IVPB        1 g 200 mL/hr over 30 Minutes Intravenous  Once 11/02/23 0039 11/02/23 0234   11/02/23 0045  azithromycin  (ZITHROMAX ) 500 mg in sodium chloride  0.9 % 250 mL IVPB        500 mg 250 mL/hr over 60 Minutes Intravenous  Once 11/02/23 0039 11/02/23 0234        MEDICATIONS: Scheduled Meds:  enoxaparin  (LOVENOX ) injection  70 mg Subcutaneous Q24H   insulin  aspart   0-9 Units Subcutaneous TID WC   methylPREDNISolone  (SOLU-MEDROL ) injection  60 mg Intravenous Q12H   pantoprazole   40 mg Oral Q1200   sodium chloride  flush  3 mL Intravenous Q12H   Continuous Infusions: PRN Meds:.acetaminophen  **OR** acetaminophen , ipratropium-albuterol , ondansetron  (ZOFRAN ) IV, polyethylene glycol   I have personally reviewed following labs and imaging studies  LABORATORY DATA: CBC: Recent Labs  Lab 11/02/23 0017 11/03/23 0533  WBC 5.1 4.6  NEUTROABS 3.5  --   HGB 14.0 14.1  HCT 40.3 40.6  MCV 81.7 81.4  PLT 144* 194    Basic Metabolic Panel: Recent Labs  Lab 11/02/23 0046 11/02/23 1628 11/03/23 0533  NA 134* 136 136  K 3.1* 3.5 3.7  CL 97* 98 101  CO2 24 23 22   GLUCOSE 120* 181* 209*  BUN 26* 27* 31*  CREATININE 1.48* 1.38* 1.31*  CALCIUM  7.7* 8.0* 7.9*  MG  --  2.3  --     GFR: Estimated Creatinine Clearance: 89.8 mL/min (A) (by C-G formula based on SCr of 1.31 mg/dL (H)).  Liver Function Tests: Recent Labs  Lab 11/03/23 0533  AST 39  ALT 39  ALKPHOS 53  BILITOT 0.8  PROT 6.8  ALBUMIN 3.1*   No results for input(s): LIPASE, AMYLASE in the last 168 hours. No results for input(s): AMMONIA in the last 168 hours.  Coagulation Profile: No results for input(s): INR, PROTIME in the last 168 hours.  Cardiac Enzymes: No results for input(s): CKTOTAL, CKMB, CKMBINDEX, TROPONINI in the last 168 hours.  BNP (last 3 results) No results for input(s): PROBNP in the last 8760 hours.  Lipid Profile: No results for input(s): CHOL, HDL, LDLCALC, TRIG, CHOLHDL, LDLDIRECT in the last 72 hours.  Thyroid  Function Tests: No results for input(s): TSH, T4TOTAL, FREET4, T3FREE, THYROIDAB in the last 72 hours.  Anemia Panel: No results for input(s): VITAMINB12, FOLATE, FERRITIN, TIBC, IRON, RETICCTPCT in the last 72 hours.  Urine analysis: No results found for: COLORURINE, APPEARANCEUR,  LABSPEC, PHURINE, GLUCOSEU, HGBUR, BILIRUBINUR, KETONESUR, PROTEINUR, UROBILINOGEN, NITRITE, LEUKOCYTESUR  Sepsis Labs: Lactic Acid, Venous No results found for: LATICACIDVEN  MICROBIOLOGY: Recent Results (from the past 240 hours)  Resp panel by RT-PCR (RSV, Flu A&B, Covid) Anterior Nasal Swab     Status: Abnormal   Collection Time: 10/26/23  7:44 AM   Specimen: Anterior Nasal Swab  Result Value Ref Range Status   SARS Coronavirus 2 by RT PCR POSITIVE (A) NEGATIVE Final   Influenza A by PCR NEGATIVE NEGATIVE Final   Influenza B by PCR NEGATIVE NEGATIVE Final    Comment: (NOTE) The Xpert Xpress SARS-CoV-2/FLU/RSV plus assay is intended as an aid in the diagnosis of influenza from Nasopharyngeal swab specimens and should not be used as a sole basis for treatment. Nasal washings and aspirates are unacceptable for Xpert Xpress SARS-CoV-2/FLU/RSV testing.  Fact Sheet  for Patients: bloggercourse.com  Fact Sheet for Healthcare Providers: seriousbroker.it  This test is not yet approved or cleared by the [place]  FDA and has been authorized for detection and/or diagnosis of SARS-CoV-2 by FDA under an Emergency Use Authorization (EUA). This EUA will remain in effect (meaning this test can be used) for the duration of the COVID-19 declaration under Section 564(b)(1) of the Act, 21 U.S.C. section 360bbb-3(b)(1), unless the authorization is terminated or revoked.     Resp Syncytial Virus by PCR NEGATIVE NEGATIVE Final    Comment: (NOTE) Fact Sheet for Patients: bloggercourse.com  Fact Sheet for Healthcare Providers: seriousbroker.it  This test is not yet approved or cleared by the [place]  FDA and has been authorized for detection and/or diagnosis of SARS-CoV-2 by FDA under an Emergency Use Authorization (EUA). This EUA will remain in effect (meaning  this test can be used) for the duration of the COVID-19 declaration under Section 564(b)(1) of the Act, 21 U.S.C. section 360bbb-3(b)(1), unless the authorization is terminated or revoked.  Performed at [facility], [address] [place], [place] [address]     RADIOLOGY STUDIES/RESULTS: DG Chest Port 1 View Result Date: 11/02/2023 CLINICAL DATA:  Shortness of breath EXAM: PORTABLE CHEST 1 VIEW COMPARISON:  10/26/2023 FINDINGS: Cardiomegaly. Left pacer remains in place, unchanged. Mild vascular congestion. Patchy bilateral airspace opacities increased since prior study. No visible effusions or acute bony abnormality. IMPRESSION: Patchy bilateral airspace disease concerning for pneumonia. Cardiomegaly, vascular congestion. Electronically Signed   By: Franky Crease M.D.   On: 11/02/2023 00:31     LOS: 1 day   Donalda Applebaum, MD  [facility]  To contact the attending provider between 7A-7P or the covering provider during after hours 7P-7A, please log into the web site [email or web address] and access using universal [place] password for that web site. If you do not have the password, please call the hospital operator.  11/03/2023, 9:49 AM

## 2023-11-03 NOTE — Plan of Care (Signed)
  Problem: Education: Goal: Knowledge of General Education information will improve Description: Including pain rating scale, medication(s)/side effects and non-pharmacologic comfort measures Outcome: Progressing   Problem: Health Behavior/Discharge Planning: Goal: Ability to manage health-related needs will improve Outcome: Progressing   Problem: Clinical Measurements: Goal: Respiratory complications will improve Outcome: Progressing   

## 2023-11-04 ENCOUNTER — Inpatient Hospital Stay (HOSPITAL_COMMUNITY): Payer: Medicare Other

## 2023-11-04 DIAGNOSIS — I441 Atrioventricular block, second degree: Secondary | ICD-10-CM | POA: Diagnosis not present

## 2023-11-04 DIAGNOSIS — J9601 Acute respiratory failure with hypoxia: Secondary | ICD-10-CM | POA: Diagnosis not present

## 2023-11-04 DIAGNOSIS — R0609 Other forms of dyspnea: Secondary | ICD-10-CM

## 2023-11-04 DIAGNOSIS — I10 Essential (primary) hypertension: Secondary | ICD-10-CM | POA: Diagnosis not present

## 2023-11-04 LAB — HEMOGLOBIN A1C
Hgb A1c MFr Bld: 8.6 % — ABNORMAL HIGH (ref 4.8–5.6)
Mean Plasma Glucose: 200 mg/dL

## 2023-11-04 LAB — CBC
HCT: 40.4 % (ref 39.0–52.0)
Hemoglobin: 13.7 g/dL (ref 13.0–17.0)
MCH: 27.9 pg (ref 26.0–34.0)
MCHC: 33.9 g/dL (ref 30.0–36.0)
MCV: 82.3 fL (ref 80.0–100.0)
Platelets: 202 10*3/uL (ref 150–400)
RBC: 4.91 MIL/uL (ref 4.22–5.81)
RDW: 13.6 % (ref 11.5–15.5)
WBC: 5.9 10*3/uL (ref 4.0–10.5)
nRBC: 0 % (ref 0.0–0.2)

## 2023-11-04 LAB — ECHOCARDIOGRAM COMPLETE
Area-P 1/2: 3.65 cm2
Calc EF: 61.1 %
Height: 76 in
S' Lateral: 4.6 cm
Single Plane A2C EF: 74.3 %
Single Plane A4C EF: 44.3 %
Weight: 5442.72 [oz_av]

## 2023-11-04 LAB — BASIC METABOLIC PANEL
Anion gap: 11 (ref 5–15)
BUN: 36 mg/dL — ABNORMAL HIGH (ref 8–23)
CO2: 24 mmol/L (ref 22–32)
Calcium: 8.1 mg/dL — ABNORMAL LOW (ref 8.9–10.3)
Chloride: 99 mmol/L (ref 98–111)
Creatinine, Ser: 1.36 mg/dL — ABNORMAL HIGH (ref 0.61–1.24)
GFR, Estimated: 57 mL/min — ABNORMAL LOW (ref >60)
Glucose, Bld: 266 mg/dL — ABNORMAL HIGH (ref 70–99)
Potassium: 3.8 mmol/L (ref 3.5–5.1)
Sodium: 134 mmol/L — ABNORMAL LOW (ref 135–145)

## 2023-11-04 LAB — C-REACTIVE PROTEIN: CRP: 3.2 mg/dL — ABNORMAL HIGH (ref <1.0)

## 2023-11-04 LAB — GLUCOSE, CAPILLARY
Glucose-Capillary: 246 mg/dL — ABNORMAL HIGH (ref 70–99)
Glucose-Capillary: 277 mg/dL — ABNORMAL HIGH (ref 70–99)
Glucose-Capillary: 240 mg/dL — ABNORMAL HIGH (ref 70–99)
Glucose-Capillary: 279 mg/dL — ABNORMAL HIGH (ref 70–99)

## 2023-11-04 MED ORDER — PERFLUTREN LIPID MICROSPHERE
1.0000 mL | INTRAVENOUS | Status: AC | PRN
  Administered 2023-11-04: 3 mL via INTRAVENOUS

## 2023-11-04 MED ORDER — INSULIN ASPART 100 UNIT/ML IJ SOLN
0.0000 [IU] | Freq: Three times a day (TID) | INTRAMUSCULAR | Status: DC
  Administered 2023-11-04: 5 [IU] via SUBCUTANEOUS
  Administered 2023-11-04: 8 [IU] via SUBCUTANEOUS

## 2023-11-04 NOTE — Progress Notes (Signed)
 OT Cancellation Note  Patient Details Name: Juan Hanson MRN: [id number] DOB: [birth year]   Cancelled Treatment:    Reason Eval/Treat Not Completed: OT screened, no needs identified, will sign off. Pt independent in self care and mobility.   Kennth Mliss Helling 11/04/2023, 11:12 AM Mliss HERO, OTR/L Acute Rehabilitation Services Office: [phone]

## 2023-11-04 NOTE — Progress Notes (Signed)
 PROGRESS NOTE        PATIENT DETAILS Name: Juan Hanson Age: 68 y.o. Sex: male Date of Birth: 11/13/1955 Admit Date: 11/01/2023 Admitting Physician Redia LOISE Cleaver, MD ERE:[email or web address], Alm, MD  Brief Summary: Patient is a 68 y.o.  male with history of HTN, complete heart block-s/p PPM implantation, OSA on CPAP, morbid obesity-who was diagnosed with COVID-19 infection on 12/28-presented with worsening shortness of breath-was found to have acute hypoxic respiratory failure secondary to COVID-19 pneumonia and subsequently admitted to the hospitalist service.  Significant events: 12/28>> ED visit-COVID-19 PCR+ve-left AMA 01/03>> presented with shortness of breath-admit to [facility]  Significant studies: 12/28>> CTA chest: No PE 01/04>> CXR: Patchy infiltrates bilaterally  Significant microbiology data: 12/28>> COVID PCR: Positive 12/28>> influenza/RSV PCR: Negative  Procedures: None  Consults: None  Subjective: Feels better-no major issues overnight-down to 3 L of oxygen this morning.  Objective: Vitals: Blood pressure 134/74, pulse (!) 59, temperature 97.6 F (36.4 C), temperature source Oral, resp. rate 15, height 6' 4 (1.93 m), weight (!) 154.3 kg, SpO2 93%.   Exam: Gen Exam:Alert awake-not in any distress HEENT:atraumatic, normocephalic Chest: B/L clear to auscultation anteriorly CVS:S1S2 regular Abdomen:soft non tender, non distended Extremities:+ edema Neurology: Non focal Skin: no rash  Pertinent Labs/Radiology:    Latest Ref Rng & Units 11/04/2023    4:56 AM 11/03/2023    5:33 AM 11/02/2023   12:17 AM  CBC  WBC 4.0 - 10.5 K/uL 5.9  4.6  5.1   Hemoglobin 13.0 - 17.0 g/dL 86.2  85.8  85.9   Hematocrit 39.0 - 52.0 % 40.4  40.6  40.3   Platelets 150 - 400 K/uL 202  194  144     Lab Results  Component Value Date   NA 134 (L) 11/04/2023   K 3.8 11/04/2023   CL 99 11/04/2023   CO2 24 11/04/2023      Assessment/Plan: Acute hypoxic  respiratory failure due to COVID-19 PNA Slowly improving Down to 3 L of oxygen Continue Solu-Medrol  Mobilize with nursing staff Flutter/incentive spirometry Slowly titrate down FiO2.   Await echo-hold off on Lasix -has unchanged leg edema.  AKI Suspect hemodynamically mediated in the setting of COVID-19 infection/losartan  use UA with proteinuria-could be related to viral illness Creatinine essentially stable-we will need to repeat electrolytes/UA once he has recovered from COVID.  HTN BP stable-all antihypertensives on hold  History of complete heart block-PPM in place Telemetry monitoring  Steroid-induced hyperglycemia A1c pending SSI while on steroids  OSA CPAP nightly  Morbid Obesity: Estimated body mass index is 41.41 kg/m as calculated from the following:   Height as of this encounter: 6' 4 (1.93 m).   Weight as of this encounter: 154.3 kg.   Code status:   Code Status: Full Code   DVT Prophylaxis:SQ Lovenox     Family Communication: None at bedside   Disposition Plan: Status is: Inpatient Remains inpatient appropriate because: Severity of illness   Planned Discharge Destination:Home   Diet: Diet Order             Diet Heart Room service appropriate? Yes; Fluid consistency: Thin  Diet effective now                     Antimicrobial agents: Anti-infectives (From admission, onward)    Start     Dose/Rate Route Frequency Ordered Stop  11/02/23 0045  cefTRIAXone  (ROCEPHIN ) 1 g in sodium chloride  0.9 % 100 mL IVPB        1 g 200 mL/hr over 30 Minutes Intravenous  Once 11/02/23 0039 11/02/23 0234   11/02/23 0045  azithromycin  (ZITHROMAX ) 500 mg in sodium chloride  0.9 % 250 mL IVPB        500 mg 250 mL/hr over 60 Minutes Intravenous  Once 11/02/23 0039 11/02/23 0234        MEDICATIONS: Scheduled Meds:  budesonide  (PULMICORT ) nebulizer solution  0.25 mg Nebulization BID   enoxaparin  (LOVENOX ) injection  80 mg Subcutaneous Q24H   insulin   aspart  0-9 Units Subcutaneous TID WC   methylPREDNISolone  (SOLU-MEDROL ) injection  60 mg Intravenous Q12H   pantoprazole   40 mg Oral Q1200   sodium chloride  flush  3 mL Intravenous Q12H   Continuous Infusions: PRN Meds:.acetaminophen  **OR** acetaminophen , benzonatate , ipratropium-albuterol , ondansetron  (ZOFRAN ) IV, polyethylene glycol   I have personally reviewed following labs and imaging studies  LABORATORY DATA: CBC: Recent Labs  Lab 11/02/23 0017 11/03/23 0533 11/04/23 0456  WBC 5.1 4.6 5.9  NEUTROABS 3.5  --   --   HGB 14.0 14.1 13.7  HCT 40.3 40.6 40.4  MCV 81.7 81.4 82.3  PLT 144* 194 202    Basic Metabolic Panel: Recent Labs  Lab 11/02/23 0046 11/02/23 1628 11/03/23 0533 11/04/23 0456  NA 134* 136 136 134*  K 3.1* 3.5 3.7 3.8  CL 97* 98 101 99  CO2 24 23 22 24   GLUCOSE 120* 181* 209* 266*  BUN 26* 27* 31* 36*  CREATININE 1.48* 1.38* 1.31* 1.36*  CALCIUM  7.7* 8.0* 7.9* 8.1*  MG  --  2.3  --   --     GFR: Estimated Creatinine Clearance: 86 mL/min (A) (by C-G formula based on SCr of 1.36 mg/dL (H)).  Liver Function Tests: Recent Labs  Lab 11/03/23 0533  AST 39  ALT 39  ALKPHOS 53  BILITOT 0.8  PROT 6.8  ALBUMIN 3.1*   No results for input(s): LIPASE, AMYLASE in the last 168 hours. No results for input(s): AMMONIA in the last 168 hours.  Coagulation Profile: No results for input(s): INR, PROTIME in the last 168 hours.  Cardiac Enzymes: No results for input(s): CKTOTAL, CKMB, CKMBINDEX, TROPONINI in the last 168 hours.  BNP (last 3 results) No results for input(s): PROBNP in the last 8760 hours.  Lipid Profile: No results for input(s): CHOL, HDL, LDLCALC, TRIG, CHOLHDL, LDLDIRECT in the last 72 hours.  Thyroid  Function Tests: No results for input(s): TSH, T4TOTAL, FREET4, T3FREE, THYROIDAB in the last 72 hours.  Anemia Panel: No results for input(s): VITAMINB12, FOLATE, FERRITIN, TIBC,  IRON, RETICCTPCT in the last 72 hours.  Urine analysis:    Component Value Date/Time   COLORURINE YELLOW 11/03/2023 1117   APPEARANCEUR CLEAR 11/03/2023 1117   LABSPEC 1.018 11/03/2023 1117   PHURINE 5.0 11/03/2023 1117   GLUCOSEU NEGATIVE 11/03/2023 1117   HGBUR MODERATE (A) 11/03/2023 1117   BILIRUBINUR NEGATIVE 11/03/2023 1117   KETONESUR NEGATIVE 11/03/2023 1117   PROTEINUR 100 (A) 11/03/2023 1117   NITRITE NEGATIVE 11/03/2023 1117   LEUKOCYTESUR NEGATIVE 11/03/2023 1117    Sepsis Labs: Lactic Acid, Venous No results found for: LATICACIDVEN  MICROBIOLOGY: Recent Results (from the past 240 hours)  Resp panel by RT-PCR (RSV, Flu A&B, Covid) Anterior Nasal Swab     Status: Abnormal   Collection Time: 10/26/23  7:44 AM   Specimen: Anterior Nasal Swab  Result Value  Ref Range Status   SARS Coronavirus 2 by RT PCR POSITIVE (A) NEGATIVE Final   Influenza A by PCR NEGATIVE NEGATIVE Final   Influenza B by PCR NEGATIVE NEGATIVE Final    Comment: (NOTE) The Xpert Xpress SARS-CoV-2/FLU/RSV plus assay is intended as an aid in the diagnosis of influenza from Nasopharyngeal swab specimens and should not be used as a sole basis for treatment. Nasal washings and aspirates are unacceptable for Xpert Xpress SARS-CoV-2/FLU/RSV testing.  Fact Sheet for Patients: bloggercourse.com  Fact Sheet for Healthcare Providers: seriousbroker.it  This test is not yet approved or cleared by the [place]  FDA and has been authorized for detection and/or diagnosis of SARS-CoV-2 by FDA under an Emergency Use Authorization (EUA). This EUA will remain in effect (meaning this test can be used) for the duration of the COVID-19 declaration under Section 564(b)(1) of the Act, 21 U.S.C. section 360bbb-3(b)(1), unless the authorization is terminated or revoked.     Resp Syncytial Virus by PCR NEGATIVE NEGATIVE Final    Comment: (NOTE) Fact Sheet  for Patients: bloggercourse.com  Fact Sheet for Healthcare Providers: seriousbroker.it  This test is not yet approved or cleared by the [place]  FDA and has been authorized for detection and/or diagnosis of SARS-CoV-2 by FDA under an Emergency Use Authorization (EUA). This EUA will remain in effect (meaning this test can be used) for the duration of the COVID-19 declaration under Section 564(b)(1) of the Act, 21 U.S.C. section 360bbb-3(b)(1), unless the authorization is terminated or revoked.  Performed at [facility], [address] [place], [place] [address]     RADIOLOGY STUDIES/RESULTS: No results found.    LOS: 2 days   Donalda Applebaum, MD  [facility]  To contact the attending provider between 7A-7P or the covering provider during after hours 7P-7A, please log into the web site [email or web address] and access using universal [place] password for that web site. If you do not have the password, please call the hospital operator.  11/04/2023, 11:09 AM

## 2023-11-04 NOTE — Inpatient Diabetes Management (Signed)
 Inpatient Diabetes Program Recommendations  AACE/ADA: New Consensus Statement on Inpatient Glycemic Control (2015)  Target Ranges:  Prepandial:   less than 140 mg/dL      Peak postprandial:   less than 180 mg/dL (1-2 hours)      Critically ill patients:  140 - 180 mg/dL   Lab Results  Component Value Date   GLUCAP 246 (H) 11/04/2023   HGBA1C 8.6 (H) 11/03/2023    Review of Glycemic Control  Latest Reference Range & Units 11/03/23 16:06 11/03/23 21:19 11/04/23 08:01  Glucose-Capillary 70 - 99 mg/dL 795 (H) 739 (H) 753 (H)  (H): Data is abnormally high Diabetes history: Type 2 DM Outpatient Diabetes medications: none Current orders for Inpatient glycemic control: Novolog  0-15 units TID Solumedrol 125 mg x 1, Decadron  10 mg x1, Solumedrol 60  mg BID  Inpatient Diabetes Program Recommendations:    In the setting of steroids:  Consider adding Semglee  10 units every day and Novolog  4 units TID (assuming patient consuming >50% of meals)  Thanks, Tinnie Minus, MSN, RNC-OB Diabetes Coordinator [phone] (8a-5p)

## 2023-11-04 NOTE — TOC CM/SW Note (Signed)
 Transition of Care [facility]) - Inpatient Brief Assessment   Patient Details  Name: Juan Hanson MRN: [id number] Date of Birth: [birth year]  [facility]) CM/SW Contact:    Inocente GORMAN Kindle, LCSW Phone Number: 11/04/2023, 4:22 PM   Clinical Narrative: Patient admitted from home with COVID. Will monitor for home O2 needs.    Transition of Care Asessment: Insurance and Status: Insurance coverage has been reviewed Patient has primary care physician: Yes Home environment has been reviewed: From home Prior level of function:: Independent Prior/Current Home Services: No current home services Social Drivers of Health Review: SDOH reviewed no interventions necessary Readmission risk has been reviewed: Yes Transition of care needs: no transition of care needs at this time

## 2023-11-04 NOTE — Plan of Care (Signed)
  Problem: Respiratory: Goal: Will maintain a patent airway Outcome: Progressing   Problem: Health Behavior/Discharge Planning: Goal: Ability to manage health-related needs will improve Outcome: Progressing   Problem: Clinical Measurements: Goal: Will remain free from infection Outcome: Progressing Goal: Respiratory complications will improve Outcome: Progressing

## 2023-11-04 NOTE — Evaluation (Signed)
 Physical Therapy Brief Evaluation and Discharge Note Patient Details Name: Juan Hanson MRN: [id number] DOB: 07/08/1956 Today's Date: 11/04/2023   History of Present Illness  Patient is a 68 y.o. male who was diagnosed with COVID-19 infection on 12/28-presented with worsening shortness of breath-was found to have acute hypoxic respiratory failure secondary to COVID-19 pneumonia. Past history of HTN, complete heart block-s/p PPM implantation, OSA on CPAP, morbid obesity.  Clinical Impression  Pt presents with admitting diagnosis above. Pt today was able to ambulate in room independently. PTA pt reports he lives alone and was fully independent. Pt presents at or near baseline mobility. Pt has no further acute PT needs and will be signing off. Re consult PT if mobility status changes. Pt would benefit from continued mobility with mobility specialist during acute stay.        PT Assessment Patient does not need any further PT services  Assistance Needed at Discharge  PRN    Equipment Recommendations None recommended by PT  Recommendations for Other Services       Precautions/Restrictions Precautions Precautions: Fall Restrictions Weight Bearing Restrictions Per Provider Order: No        Mobility  Bed Mobility       General bed mobility comments: Pt up in chair  Transfers Overall transfer level: Independent Equipment used: None                    Ambulation/Gait Ambulation/Gait assistance: Independent Gait Distance (Feet): 15 Feet Assistive device: None Gait Pattern/deviations: WFL(Within Functional Limits) Gait Speed: Pace WFL General Gait Details: no LOB noted. Pt able to ambulate around room independently. Limited to room due to airborne precautions.  Home Activity Instructions    Stairs            Modified Rankin (Stroke Patients Only)        Balance Overall balance assessment: No apparent balance deficits (not formally assessed)                         Pertinent Vitals/Pain PT - Brief Vital Signs All Vital Signs Stable: Yes Pain Assessment Pain Assessment: No/denies pain     Home Living Family/patient expects to be discharged to:: Private residence Living Arrangements: Alone Available Help at Discharge: Friend(s);Available PRN/intermittently Home Environment: Stairs to enter  [employer] of Steps: 4 Home Equipment: Hand held shower head        Prior Function Level of Independence: Independent      UE/LE Assessment   UE ROM/Strength/Tone/Coordination: WFL    LE ROM/Strength/Tone/Coordination: [facility]      Communication   Communication Communication: No apparent difficulties     Cognition Overall Cognitive Status: Appears within functional limits for tasks assessed/performed       General Comments General comments (skin integrity, edema, etc.): Pt remained at 93% on 3L.    Exercises     Assessment/Plan    PT Problem List         PT Visit Diagnosis Other abnormalities of gait and mobility (R26.89)    No Skilled PT Patient at baseline level of functioning;Patient is independent with all acitivity/mobility   Co-evaluation                AMPAC 6 Clicks Help needed turning from your back to your side while in a flat bed without using bedrails?: None Help needed moving from lying on your back to sitting on the side of a flat bed without using bedrails?: None  Help needed moving to and from a bed to a chair (including a wheelchair)?: None Help needed standing up from a chair using your arms (e.g., wheelchair or bedside chair)?: None Help needed to walk in hospital room?: None Help needed climbing 3-5 steps with a railing? : None 6 Click Score: 24      End of Session Equipment Utilized During Treatment: Gait belt;Oxygen Activity Tolerance: Patient tolerated treatment well Patient left: in bed;with call bell/phone within reach Nurse Communication: Mobility status PT Visit  Diagnosis: Other abnormalities of gait and mobility (R26.89)     Time: 9040-8983 PT Time Calculation (min) (ACUTE ONLY): 17 min  Charges:   PT Evaluation $PT Eval Low Complexity: 1 Low      Sueellen NOVAK, PT, DPT Acute Rehab Services [phone]   [name]  11/04/2023, 12:13 PM

## 2023-11-05 DIAGNOSIS — J9601 Acute respiratory failure with hypoxia: Secondary | ICD-10-CM | POA: Diagnosis not present

## 2023-11-05 DIAGNOSIS — I10 Essential (primary) hypertension: Secondary | ICD-10-CM | POA: Diagnosis not present

## 2023-11-05 DIAGNOSIS — I441 Atrioventricular block, second degree: Secondary | ICD-10-CM | POA: Diagnosis not present

## 2023-11-05 LAB — GLUCOSE, CAPILLARY
Glucose-Capillary: 232 mg/dL — ABNORMAL HIGH (ref 70–99)
Glucose-Capillary: 321 mg/dL — ABNORMAL HIGH (ref 70–99)
Glucose-Capillary: 292 mg/dL — ABNORMAL HIGH (ref 70–99)
Glucose-Capillary: 257 mg/dL — ABNORMAL HIGH (ref 70–99)

## 2023-11-05 LAB — BASIC METABOLIC PANEL
Anion gap: 11 (ref 5–15)
BUN: 34 mg/dL — ABNORMAL HIGH (ref 8–23)
CO2: 24 mmol/L (ref 22–32)
Calcium: 8.2 mg/dL — ABNORMAL LOW (ref 8.9–10.3)
Chloride: 99 mmol/L (ref 98–111)
Creatinine, Ser: 1.25 mg/dL — ABNORMAL HIGH (ref 0.61–1.24)
GFR, Estimated: >60 mL/min (ref >60)
Glucose, Bld: 316 mg/dL — ABNORMAL HIGH (ref 70–99)
Potassium: 4.2 mmol/L (ref 3.5–5.1)
Sodium: 134 mmol/L — ABNORMAL LOW (ref 135–145)

## 2023-11-05 MED ORDER — INSULIN ASPART 100 UNIT/ML IJ SOLN
0.0000 [IU] | Freq: Three times a day (TID) | INTRAMUSCULAR | Status: DC
  Administered 2023-11-05: 11 [IU] via SUBCUTANEOUS
  Administered 2023-11-05: 7 [IU] via SUBCUTANEOUS
  Administered 2023-11-05: 15 [IU] via SUBCUTANEOUS
  Administered 2023-11-06: 7 [IU] via SUBCUTANEOUS

## 2023-11-05 MED ORDER — FUROSEMIDE 10 MG/ML IJ SOLN
20.0000 mg | Freq: Once | INTRAMUSCULAR | Status: AC
  Administered 2023-11-05: 20 mg via INTRAVENOUS
  Filled 2023-11-05: qty 2

## 2023-11-05 MED ORDER — METHYLPREDNISOLONE SODIUM SUCC 40 MG IJ SOLR
40.0000 mg | Freq: Two times a day (BID) | INTRAMUSCULAR | Status: DC
  Administered 2023-11-05: 40 mg via INTRAVENOUS
  Filled 2023-11-05: qty 1

## 2023-11-05 MED ORDER — INSULIN GLARGINE-YFGN 100 UNIT/ML ~~LOC~~ SOLN
10.0000 [IU] | Freq: Every day | SUBCUTANEOUS | Status: DC
  Administered 2023-11-05: 10 [IU] via SUBCUTANEOUS
  Filled 2023-11-05 (×2): qty 0.1

## 2023-11-05 NOTE — Inpatient Diabetes Management (Signed)
 Inpatient Diabetes Program Recommendations  AACE/ADA: New Consensus Statement on Inpatient Glycemic Control (2015)  Target Ranges:  Prepandial:   less than 140 mg/dL      Peak postprandial:   less than 180 mg/dL (1-2 hours)      Critically ill patients:  140 - 180 mg/dL   Lab Results  Component Value Date   GLUCAP 321 (H) 11/05/2023   HGBA1C 8.6 (H) 11/03/2023    Review of Glycemic Control  Latest Reference Range & Units 11/04/23 08:01 11/04/23 12:29 11/04/23 16:20 11/04/23 21:11 11/05/23 07:46 11/05/23 11:55  Glucose-Capillary 70 - 99 mg/dL 753 (H) 722 (H) 759 (H) 279 (H) 232 (H) 321 (H)  (H): Data is abnormally high Diabetes history: Type 2 DM Outpatient Diabetes medications: none Current orders for Inpatient glycemic control: Novolog  0-20 units TID, Semglee  10 units QD Solumedrol 125 mg x 1, Decadron  10 mg x1, Solumedrol 60  mg BID   Inpatient Diabetes Program Recommendations:     In the setting of steroids:  Consider adding Novolog  4 units TID (assuming patient consuming >50% of meals)  Spoke with patient regarding new onset DM.  Reviewed patient's current A1c of 8.6%. Explained what a A1c is and what it measures. Also reviewed goal A1c with patient, importance of good glucose control @ home, and blood sugar goals. Reviewed patho of DM, need for improved control, survival skills, interventions, signs and symptoms of hypo vs hyperglycemia, vascular changes and commorbidities.  Patient will need a meter. Reviewed recommended frequency and when to call MD. Patient to schedule a PCP visit. Also, reviewed GLP1 drugs.  Recommend adding Metformin  500 mg BID at discharge.  Reports that he began working on his diet after thanksgiving. Denies drinking sugary beverages, however, is drinking regular soda at bedside. Reviewed importance of protein and suggested outpatient education referral and consult with dietitian. Patient refused.  No further questions at this time.   Thanks, Tinnie Minus, MSN, RNC-OB Diabetes Coordinator [phone] (8a-5p)

## 2023-11-05 NOTE — Plan of Care (Signed)
  Problem: Education: Goal: Knowledge of risk factors and measures for prevention of condition will improve Outcome: Progressing   Problem: Coping: Goal: Psychosocial and spiritual needs will be supported Outcome: Progressing   Problem: Respiratory: Goal: Will maintain a patent airway Outcome: Progressing Goal: Complications related to the disease process, condition or treatment will be avoided or minimized Outcome: Progressing   Problem: Health Behavior/Discharge Planning: Goal: Ability to manage health-related needs will improve Outcome: Progressing   Problem: Clinical Measurements: Goal: Ability to maintain clinical measurements within normal limits will improve Outcome: Progressing Goal: Will remain free from infection Outcome: Progressing Goal: Diagnostic test results will improve Outcome: Progressing Goal: Respiratory complications will improve Outcome: Progressing Goal: Cardiovascular complication will be avoided Outcome: Progressing   Problem: Activity: Goal: Risk for activity intolerance will decrease Outcome: Progressing   Problem: Nutrition: Goal: Adequate nutrition will be maintained Outcome: Progressing   Problem: Coping: Goal: Level of anxiety will decrease Outcome: Progressing   Problem: Elimination: Goal: Will not experience complications related to bowel motility Outcome: Progressing Goal: Will not experience complications related to urinary retention Outcome: Progressing   Problem: Pain Management: Goal: General experience of comfort will improve Outcome: Progressing   Problem: Safety: Goal: Ability to remain free from injury will improve Outcome: Progressing   Problem: Skin Integrity: Goal: Risk for impaired skin integrity will decrease Outcome: Progressing   Problem: Education: Goal: Ability to describe self-care measures that may prevent or decrease complications (Diabetes Survival Skills Education) will improve Outcome:  Progressing Goal: Individualized Educational Video(s) Outcome: Progressing   Problem: Coping: Goal: Ability to adjust to condition or change in health will improve Outcome: Progressing   Problem: Fluid Volume: Goal: Ability to maintain a balanced intake and output will improve Outcome: Progressing   Problem: Health Behavior/Discharge Planning: Goal: Ability to identify and utilize available resources and services will improve Outcome: Progressing Goal: Ability to manage health-related needs will improve Outcome: Progressing   Problem: Metabolic: Goal: Ability to maintain appropriate glucose levels will improve Outcome: Progressing   Problem: Nutritional: Goal: Maintenance of adequate nutrition will improve Outcome: Progressing Goal: Progress toward achieving an optimal weight will improve Outcome: Progressing   Problem: Skin Integrity: Goal: Risk for impaired skin integrity will decrease Outcome: Progressing   Problem: Tissue Perfusion: Goal: Adequacy of tissue perfusion will improve Outcome: Progressing

## 2023-11-05 NOTE — Plan of Care (Signed)
  Problem: Health Behavior/Discharge Planning: Goal: Ability to manage health-related needs will improve Outcome: Progressing   Problem: Clinical Measurements: Goal: Will remain free from infection Outcome: Progressing Goal: Respiratory complications will improve Outcome: Progressing   Problem: Coping: Goal: Level of anxiety will decrease Outcome: Progressing

## 2023-11-05 NOTE — Progress Notes (Signed)
 PROGRESS NOTE        PATIENT DETAILS Name: Juan Hanson Age: 68 y.o. Sex: male Date of Birth: [birth year] Admit Date: 11/01/2023 Admitting Physician Redia LOISE Cleaver, MD ERE:[email or web address], Alm, MD  Brief Summary: Patient is a 68 y.o.  male with history of HTN, complete heart block-s/p PPM implantation, OSA on CPAP, morbid obesity-who was diagnosed with COVID-19 infection on 12/28-presented with worsening shortness of breath-was found to have acute hypoxic respiratory failure secondary to COVID-19 pneumonia and subsequently admitted to the hospitalist service.  Significant events: 12/28>> ED visit-COVID-19 PCR+ve-left AMA 01/03>> presented with shortness of breath-admit to [facility]  Significant studies: 12/28>> CTA chest: No PE 01/04>> CXR: Patchy infiltrates bilaterally 01/06>> echo: EF 60-65%  Significant microbiology data: 12/28>> COVID PCR: Positive 12/28>> influenza/RSV PCR: Negative  Procedures: None  Consults: None  Subjective: Continues to improve-no complaints-down to 1.5 L of oxygen this morning.  Objective: Vitals: Blood pressure 133/74, pulse 61, temperature 97.8 F (36.6 C), temperature source Oral, resp. rate 17, height 6' 4 (1.93 m), weight (!) 157.5 kg, SpO2 93%.   Exam: Gen Exam:Alert awake-not in any distress HEENT:atraumatic, normocephalic Chest: B/L clear to auscultation anteriorly CVS:S1S2 regular Abdomen:soft non tender, non distended Extremities:+ edema Neurology: Non focal Skin: no rash  Pertinent Labs/Radiology:    Latest Ref Rng & Units 11/04/2023    4:56 AM 11/03/2023    5:33 AM 11/02/2023   12:17 AM  CBC  WBC 4.0 - 10.5 K/uL 5.9  4.6  5.1   Hemoglobin 13.0 - 17.0 g/dL 86.2  85.8  85.9   Hematocrit 39.0 - 52.0 % 40.4  40.6  40.3   Platelets 150 - 400 K/uL 202  194  144     Lab Results  Component Value Date   NA 134 (L) 11/05/2023   K 4.2 11/05/2023   CL 99 11/05/2023   CO2 24 11/05/2023       Assessment/Plan: Acute hypoxic respiratory failure due to COVID-19 PNA Improving-titrated down to 1.5 L Continue Solu-Medrol  Incentive spirometry/flutter valve Discussed with nursing staff to see if we can get him off oxygen today. Mild leg edema-unchanged for the past several days-echo stable-but will give 1 dose of IV Lasix  to see if this will help keep him in negative balance.  AKI Suspect hemodynamically mediated in the setting of COVID-19 infection/losartan  use UA with proteinuria-could be related to viral illness Creatinine essentially stable-we will need to repeat electrolytes/UA once he has recovered from COVID.  HTN BP stable-all antihypertensives on hold  History of complete heart block-PPM in place Telemetry monitoring  Newly diagnosed DM-2 steroid-induced hyperglycemia A1c 8.6 10 units of Semglee -resistant SSI while inpatient Suspect as steroids get tapered down-will not require insulin  on discharge. Plan on metformin  on discharge Diabetic education/diabetic coordinator consult.  OSA CPAP nightly  Morbid Obesity: Estimated body mass index is 42.27 kg/m as calculated from the following:   Height as of this encounter: 6' 4 (1.93 m).   Weight as of this encounter: 157.5 kg.   Code status:   Code Status: Full Code   DVT Prophylaxis:SQ Lovenox     Family Communication: None at bedside   Disposition Plan: Status is: Inpatient Remains inpatient appropriate because: Severity of illness   Planned Discharge Destination:Home   Diet: Diet Order             Diet Heart Room  service appropriate? Yes; Fluid consistency: Thin  Diet effective now                     Antimicrobial agents: Anti-infectives (From admission, onward)    Start     Dose/Rate Route Frequency Ordered Stop   11/02/23 0045  cefTRIAXone  (ROCEPHIN ) 1 g in sodium chloride  0.9 % 100 mL IVPB        1 g 200 mL/hr over 30 Minutes Intravenous  Once 11/02/23 0039 11/02/23 0234    11/02/23 0045  azithromycin  (ZITHROMAX ) 500 mg in sodium chloride  0.9 % 250 mL IVPB        500 mg 250 mL/hr over 60 Minutes Intravenous  Once 11/02/23 0039 11/02/23 0234        MEDICATIONS: Scheduled Meds:  budesonide  (PULMICORT ) nebulizer solution  0.25 mg Nebulization BID   enoxaparin  (LOVENOX ) injection  80 mg Subcutaneous Q24H   insulin  aspart  0-20 Units Subcutaneous TID WC   insulin  glargine-yfgn  10 Units Subcutaneous Daily   methylPREDNISolone  (SOLU-MEDROL ) injection  60 mg Intravenous Q12H   pantoprazole   40 mg Oral Q1200   sodium chloride  flush  3 mL Intravenous Q12H   Continuous Infusions: PRN Meds:.acetaminophen  **OR** acetaminophen , benzonatate , ipratropium-albuterol , ondansetron  (ZOFRAN ) IV, polyethylene glycol   I have personally reviewed following labs and imaging studies  LABORATORY DATA: CBC: Recent Labs  Lab 11/02/23 0017 11/03/23 0533 11/04/23 0456  WBC 5.1 4.6 5.9  NEUTROABS 3.5  --   --   HGB 14.0 14.1 13.7  HCT 40.3 40.6 40.4  MCV 81.7 81.4 82.3  PLT 144* 194 202    Basic Metabolic Panel: Recent Labs  Lab 11/02/23 0046 11/02/23 1628 11/03/23 0533 11/04/23 0456 11/05/23 0451  NA 134* 136 136 134* 134*  K 3.1* 3.5 3.7 3.8 4.2  CL 97* 98 101 99 99  CO2 24 23 22 24 24   GLUCOSE 120* 181* 209* 266* 316*  BUN 26* 27* 31* 36* 34*  CREATININE 1.48* 1.38* 1.31* 1.36* 1.25*  CALCIUM  7.7* 8.0* 7.9* 8.1* 8.2*  MG  --  2.3  --   --   --     GFR: Estimated Creatinine Clearance: 94.6 mL/min (A) (by C-G formula based on SCr of 1.25 mg/dL (H)).  Liver Function Tests: Recent Labs  Lab 11/03/23 0533  AST 39  ALT 39  ALKPHOS 53  BILITOT 0.8  PROT 6.8  ALBUMIN 3.1*   No results for input(s): LIPASE, AMYLASE in the last 168 hours. No results for input(s): AMMONIA in the last 168 hours.  Coagulation Profile: No results for input(s): INR, PROTIME in the last 168 hours.  Cardiac Enzymes: No results for input(s): CKTOTAL, CKMB,  CKMBINDEX, TROPONINI in the last 168 hours.  BNP (last 3 results) No results for input(s): PROBNP in the last 8760 hours.  Lipid Profile: No results for input(s): CHOL, HDL, LDLCALC, TRIG, CHOLHDL, LDLDIRECT in the last 72 hours.  Thyroid  Function Tests: No results for input(s): TSH, T4TOTAL, FREET4, T3FREE, THYROIDAB in the last 72 hours.  Anemia Panel: No results for input(s): VITAMINB12, FOLATE, FERRITIN, TIBC, IRON, RETICCTPCT in the last 72 hours.  Urine analysis:    Component Value Date/Time   COLORURINE YELLOW 11/03/2023 1117   APPEARANCEUR CLEAR 11/03/2023 1117   LABSPEC 1.018 11/03/2023 1117   PHURINE 5.0 11/03/2023 1117   GLUCOSEU NEGATIVE 11/03/2023 1117   HGBUR MODERATE (A) 11/03/2023 1117   BILIRUBINUR NEGATIVE 11/03/2023 1117   KETONESUR NEGATIVE 11/03/2023 1117   PROTEINUR  100 (A) 11/03/2023 1117   NITRITE NEGATIVE 11/03/2023 1117   LEUKOCYTESUR NEGATIVE 11/03/2023 1117    Sepsis Labs: Lactic Acid, Venous No results found for: LATICACIDVEN  MICROBIOLOGY: No results found for this or any previous visit (from the past 240 hours).   RADIOLOGY STUDIES/RESULTS: ECHOCARDIOGRAM COMPLETE Result Date: 11/04/2023    ECHOCARDIOGRAM REPORT   Patient Name:   Juan Hanson Date of Exam: 11/04/2023 Medical Rec #:  [id number]       Height:       76.0 in Accession #:    [id number]      Weight:       340.2 lb Date of Birth:  November 13, 1955       BSA:          2.776 m Patient Age:    67 years        BP:           120/70 mmHg Patient Gender: M               HR:           69 bpm. Exam Location:  Inpatient Procedure: 2D Echo, Cardiac Doppler, Color Doppler and Intracardiac            Opacification Agent Indications:    Dyspnea  History:        Patient has no prior history of Echocardiogram examinations.                 Pacemaker; Risk Factors:Hypertension.  Sonographer:    Lanell Maduro Referring Phys: 28 [facility] M Phyllis Abelson  Sonographer  Comments: Technically difficult study due to poor echo windows. IMPRESSIONS  1. Technically difficult study with reduced echo windows  2. Left ventricular ejection fraction, by estimation, is 60 to 65%. Left ventricular ejection fraction by 2D MOD biplane is 61.1 %. The left ventricle has normal function. The left ventricle has no regional wall motion abnormalities. Left ventricular diastolic parameters were normal.  3. Right ventricular systolic function is normal. The right ventricular size is normal. Tricuspid regurgitation signal is inadequate for assessing PA pressure.  4. The aortic valve is tricuspid. Aortic valve regurgitation is not visualized.  5. The mitral valve is grossly normal. No evidence of mitral valve regurgitation. Comparison(s): No prior Echocardiogram. FINDINGS  Left Ventricle: Left ventricular ejection fraction, by estimation, is 60 to 65%. Left ventricular ejection fraction by 2D MOD biplane is 61.1 %. The left ventricle has normal function. The left ventricle has no regional wall motion abnormalities. Definity  contrast agent was given IV to delineate the left ventricular endocardial borders. The left ventricular internal cavity size was normal in size. There is no left ventricular hypertrophy. Abnormal (paradoxical) septal motion, consistent with RV pacemaker. Left ventricular diastolic parameters were normal. Right Ventricle: The right ventricular size is normal. No increase in right ventricular wall thickness. Right ventricular systolic function is normal. Tricuspid regurgitation signal is inadequate for assessing PA pressure. Left Atrium: Left atrial size was normal in size. Right Atrium: Right atrial size was normal in size. Pericardium: There is no evidence of pericardial effusion. Mitral Valve: The mitral valve is grossly normal. No evidence of mitral valve regurgitation. Tricuspid Valve: The tricuspid valve is not well visualized. Tricuspid valve regurgitation is not demonstrated.  Aortic Valve: The aortic valve is tricuspid. Aortic valve regurgitation is not visualized. Pulmonic Valve: The pulmonic valve was normal in structure. Pulmonic valve regurgitation is not visualized. Aorta: The aortic root and ascending aorta are structurally normal, with  no evidence of dilitation. IAS/Shunts: No atrial level shunt detected by color flow Doppler. Additional Comments: A device lead is visualized.  LEFT VENTRICLE PLAX 2D                        Biplane EF (MOD) LVIDd:         5.20 cm         LV Biplane EF:   Left LVIDs:         4.60 cm                          ventricular LV PW:         1.10 cm                          ejection LV IVS:        1.00 cm                          fraction by LVOT diam:     2.20 cm                          2D MOD LV SV:         99                               biplane is LV SV Index:   36                               61.1 %. LVOT Area:     3.80 cm                                Diastology                                LV e' medial:    6.74 cm/s LV Volumes (MOD)               LV E/e' medial:  12.0 LV vol d, MOD    131.0 ml      LV e' lateral:   6.09 cm/s A2C:                           LV E/e' lateral: 13.3 LV vol d, MOD    253.0 ml A4C: LV vol s, MOD    33.7 ml A2C: LV vol s, MOD    141.0 ml A4C: LV SV MOD A2C:   97.3 ml LV SV MOD A4C:   253.0 ml LV SV MOD BP:    111.2 ml RIGHT VENTRICLE RV S prime:     17.40 cm/s TAPSE (M-mode): 2.3 cm LEFT ATRIUM             Index LA diam:        4.70 cm 1.69 cm/m LA Vol (A2C):   34.7 ml 12.50 ml/m LA Vol (A4C):   36.5 ml 13.15 ml/m LA Biplane Vol: 37.2 ml 13.40 ml/m  AORTIC VALVE LVOT Vmax:   107.00 cm/s LVOT Vmean:  69.200 cm/s LVOT VTI:  0.260 m  AORTA Ao Root diam: 3.60 cm Ao Asc diam:  3.70 cm MITRAL VALVE MV Area (PHT): 3.65 cm    SHUNTS MV Decel Time: 208 msec    Systemic VTI:  0.26 m MV E velocity: 80.90 cm/s  Systemic Diam: 2.20 cm MV A velocity: 73.00 cm/s MV E/A ratio:  1.11 Vinie Maxcy MD Electronically signed by  Vinie Maxcy MD Signature Date/Time: 11/04/2023/12:42:50 PM    Final       LOS: 3 days   Donalda Applebaum, MD  [facility]  To contact the attending provider between 7A-7P or the covering provider during after hours 7P-7A, please log into the web site [email or web address] and access using universal [place] password for that web site. If you do not have the password, please call the hospital operator.  11/05/2023, 10:50 AM

## 2023-11-06 ENCOUNTER — Other Ambulatory Visit (HOSPITAL_COMMUNITY): Payer: Self-pay

## 2023-11-06 DIAGNOSIS — J9601 Acute respiratory failure with hypoxia: Secondary | ICD-10-CM | POA: Diagnosis not present

## 2023-11-06 DIAGNOSIS — I441 Atrioventricular block, second degree: Secondary | ICD-10-CM | POA: Diagnosis not present

## 2023-11-06 DIAGNOSIS — I10 Essential (primary) hypertension: Secondary | ICD-10-CM | POA: Diagnosis not present

## 2023-11-06 LAB — GLUCOSE, CAPILLARY: Glucose-Capillary: 223 mg/dL — ABNORMAL HIGH (ref 70–99)

## 2023-11-06 MED ORDER — ACCU-CHEK SOFTCLIX LANCETS MISC
1.0000 | Freq: Three times a day (TID) | 0 refills | Status: AC
  Filled 2023-11-06: qty 100, 33d supply, fill #0

## 2023-11-06 MED ORDER — LANCET DEVICE MISC
1.0000 | Freq: Three times a day (TID) | 0 refills | Status: AC
  Filled 2023-11-06: qty 1, 30d supply, fill #0

## 2023-11-06 MED ORDER — NOVOLOG FLEXPEN 100 UNIT/ML ~~LOC~~ SOPN
PEN_INJECTOR | SUBCUTANEOUS | 0 refills | Status: DC
  Filled 2023-11-06: qty 15, 55d supply, fill #0

## 2023-11-06 MED ORDER — BENZONATATE 200 MG PO CAPS
200.0000 mg | ORAL_CAPSULE | Freq: Three times a day (TID) | ORAL | 0 refills | Status: DC | PRN
  Filled 2023-11-06: qty 20, 7d supply, fill #0

## 2023-11-06 MED ORDER — PREDNISONE 10 MG PO TABS
ORAL_TABLET | ORAL | 0 refills | Status: AC
  Filled 2023-11-06: qty 10, 4d supply, fill #0

## 2023-11-06 MED ORDER — METFORMIN HCL 500 MG PO TABS
500.0000 mg | ORAL_TABLET | Freq: Two times a day (BID) | ORAL | 1 refills | Status: DC
  Filled 2023-11-06: qty 60, 30d supply, fill #0

## 2023-11-06 MED ORDER — INSULIN GLARGINE-YFGN 100 UNIT/ML ~~LOC~~ SOLN
15.0000 [IU] | Freq: Every day | SUBCUTANEOUS | Status: DC
  Administered 2023-11-06: 15 [IU] via SUBCUTANEOUS
  Filled 2023-11-06: qty 0.15

## 2023-11-06 MED ORDER — PREDNISONE 20 MG PO TABS
40.0000 mg | ORAL_TABLET | Freq: Every day | ORAL | Status: DC
  Administered 2023-11-06: 40 mg via ORAL
  Filled 2023-11-06: qty 2

## 2023-11-06 MED ORDER — BLOOD GLUCOSE MONITOR SYSTEM W/DEVICE KIT
1.0000 | PACK | Freq: Three times a day (TID) | 0 refills | Status: DC
Start: 2023-11-06 — End: 2024-06-02
  Filled 2023-11-06: qty 1, 30d supply, fill #0

## 2023-11-06 MED ORDER — INSULIN PEN NEEDLE 32G X 4 MM MISC
1.0000 | 0 refills | Status: DC | PRN
  Filled 2023-11-06 (×2): qty 200, 30d supply, fill #0

## 2023-11-06 MED ORDER — BLOOD GLUCOSE TEST VI STRP
1.0000 | ORAL_STRIP | Freq: Three times a day (TID) | 0 refills | Status: AC
  Filled 2023-11-06: qty 100, 33d supply, fill #0

## 2023-11-06 NOTE — Discharge Summary (Addendum)
 PATIENT DETAILS Name: Juan Hanson Age: 68 y.o. Sex: male Date of Birth: 02-01-1956 MRN: [id number]. Admitting Physician: Redia LOISE Cleaver, MD ERE:[id number], Alm, MD  Admit Date: 11/01/2023 Discharge date: 11/06/2023  Recommendations for Outpatient Follow-up:  Follow up with PCP in 1-2 weeks Please obtain CMP/CBC in one week Note-new diagnosis of DM-2 Has proteinuria on UA-probably related to COVID-19 infection/viral illness-please repeat UA once he has recovered from acute illness.  If proteinuria is persistent-May need nephrology evaluation.  Admitted From:  Home  Disposition: Home   Discharge Condition: good  CODE STATUS:   Code Status: Full Code   Diet recommendation:  Diet Order             Diet - low sodium heart healthy           Diet Carb Modified           Diet Heart Room service appropriate? Yes; Fluid consistency: Thin  Diet effective now                    Brief Summary: Patient is a 68 y.o.  male with history of HTN, complete heart block-s/p PPM implantation, OSA on CPAP, morbid obesity-who was diagnosed with COVID-19 infection on 12/28-presented with worsening shortness of breath-was found to have acute hypoxic respiratory failure secondary to COVID-19 pneumonia and subsequently admitted to the hospitalist service.   Significant events: 12/28>> ED visit-COVID-19 PCR+ve-left AMA 01/03>> presented with shortness of breath-admit to [facility]   Significant studies: 12/28>> CTA chest: No PE 01/04>> CXR: Patchy infiltrates bilaterally 01/06>> echo: EF 60-65%   Significant microbiology data: 12/28>> COVID PCR: Positive 12/28>> influenza/RSV PCR: Negative   Procedures: None   Consults: None  Brief Hospital Course: Acute hypoxic respiratory failure due to COVID-19 PNA Initially requiring around 6 L of oxygen-gradually improved with steroids-by day of discharge-titrated to room air.  Moving around the room without any issues-feels that his  breathing is back to his usual baseline Continue tapering prednisone  for a few more days Follow-up with PCP for posthospital discharge visit.   AKI Suspect hemodynamically mediated in the setting of COVID-19 infection/losartan  use UA with proteinuria-could be related to viral illness Creatinine has improved-downtrending-will need to repeat electrolytes/UA once he has recovered from COVID.   HTN Blood pressure was stable during this hospitalization both amlodipine /losartan  were held BP is now slowly creeping up-will resume amlodipine  on discharge-PCP to reassess and resume losartan  accordingly.   History of complete heart block-PPM in place Telemetry monitoring   Newly diagnosed DM-2 steroid-induced hyperglycemia A1c 8.6 Was on Semglee /SSI during this hospitalization as he had steroid-induced hyperglycemia Suspect as steroids get tapered down-he will require less insulin  Plan is to discharge him on metformin -he will continue SSI while he is on steroids. PCP to assess/optimize regimen accordingly.   Extensive diabetic education provided by diabetic coordinator.   OSA CPAP nightly   Morbid Obesity: Estimated body mass index is 42.27 kg/m as calculated from the following:   Height as of this encounter: 6' 4 (1.93 m).   Weight as of this encounter: 157.5 kg   Discharge Diagnoses:  Principal Problem:   Acute respiratory failure with hypoxia (HCC) Active Problems:   Morbid obesity (HCC)   Sleep apnea in adult   Mobitz type 2 second degree atrioventricular block   Pacemaker   Essential hypertension   Discharge Instructions:  Activity:  As tolerated   Discharge Instructions     Call MD for:  difficulty breathing, headache or visual  disturbances   Complete by: As directed    Call MD for:  extreme fatigue   Complete by: As directed    Call MD for:  persistant dizziness or light-headedness   Complete by: As directed    Diet - low sodium heart healthy   Complete by: As  directed    Diet Carb Modified   Complete by: As directed    Discharge instructions   Complete by: As directed    Follow with Primary MD  Seabron Lenis, MD in 1-2 weeks  Please get a complete blood count and chemistry panel checked by your Primary MD at your next visit, and again as instructed by your Primary MD.  Please check your capillary blood glucose for 2-3 times a day-keep a record of these readings and take it to your next appointment with your primary care practitioner  While you are on steroids-use sliding scale insulin -as steroids get tapered down-your sugar should improve and your insulin  requirements should be minimal.  Get Medicines reviewed and adjusted: Please take all your medications with you for your next visit with your Primary MD  Laboratory/radiological data: Please request your Primary MD to go over all hospital tests and procedure/radiological results at the follow up, please ask your Primary MD to get all Hospital records sent to his/her office.  In some cases, they will be blood work, cultures and biopsy results pending at the time of your discharge. Please request that your primary care M.D. follows up on these results.  Also Note the following: If you experience worsening of your admission symptoms, develop shortness of breath, life threatening emergency, suicidal or homicidal thoughts you must seek medical attention immediately by calling 911 or calling your MD immediately  if symptoms less severe.  You must read complete instructions/literature along with all the possible adverse reactions/side effects for all the Medicines you take and that have been prescribed to you. Take any new Medicines after you have completely understood and accpet all the possible adverse reactions/side effects.   Do not drive when taking Pain medications or sleeping medications (Benzodaizepines)  Do not take more than prescribed Pain, Sleep and Anxiety Medications. It is not  advisable to combine anxiety,sleep and pain medications without talking with your primary care practitioner  Special Instructions: If you have smoked or chewed Tobacco  in the last 2 yrs please stop smoking, stop any regular Alcohol  and or any Recreational drug use.  Wear Seat belts while driving.  Please note: You were cared for by a hospitalist during your hospital stay. Once you are discharged, your primary care physician will handle any further medical issues. Please note that NO REFILLS for any discharge medications will be authorized once you are discharged, as it is imperative that you return to your primary care physician (or establish a relationship with a primary care physician if you do not have one) for your post hospital discharge needs so that they can reassess your need for medications and monitor your lab values.   Increase activity slowly   Complete by: As directed       Allergies as of 11/06/2023   Not on File      Medication List     STOP taking these medications    Arexvy  120 MCG/0.5ML injection Generic drug: RSV vaccine recomb adjuvanted   losartan  100 MG tablet Commonly known as: COZAAR        TAKE these medications    acetaminophen  325 MG tablet Commonly known as: TYLENOL   Take 1-2 tablets (325-650 mg total) by mouth every 4 (four) hours as needed for mild pain.   amLODipine  10 MG tablet Commonly known as: NORVASC  Take 10 mg by mouth daily.   benzonatate  200 MG capsule Commonly known as: TESSALON  Take 1 capsule (200 mg total) by mouth 3 (three) times daily as needed for cough.   Blood Glucose Monitoring Suppl Devi 1 each by Does not apply route in the morning, at noon, and at bedtime. May substitute to any manufacturer covered by patient's insurance.   BLOOD GLUCOSE TEST STRIPS Strp 1 each by In Vitro route in the morning, at noon, and at bedtime. May substitute to any manufacturer covered by patient's insurance.   Insulin  Pen Needle 32G X 4 MM  Misc 1 each by Does not apply route as needed.   Lancet Device Misc 1 each by Does not apply route in the morning, at noon, and at bedtime. May substitute to any manufacturer covered by patient's insurance.   Lancets Misc. Misc 1 each by Does not apply route in the morning, at noon, and at bedtime. May substitute to any manufacturer covered by patient's insurance.   metFORMIN  500 MG tablet Commonly known as: GLUCOPHAGE  Take 1 tablet (500 mg total) by mouth 2 (two) times daily with a meal.   NovoLOG  FlexPen 100 UNIT/ML FlexPen Generic drug: insulin  aspart 0-9 Units, Subcutaneous, 3 times daily with meals CBG < 70: Implement Hypoglycemia measures CBG 70 - 120: 0 units CBG 121 - 150: 1 unit CBG 151 - 200: 2 units CBG 201 - 250: 3 units CBG 251 - 300: 5 units CBG 301 - 350: 7 units CBG 351 - 400: 9 units CBG > 400: call MD   predniSONE  10 MG tablet Commonly known as: DELTASONE  Take 40 mg daily for 1 day, 30 mg daily for 1 day, 20 mg daily for 1 days,10 mg daily for 1 day, then stop Start taking on: November 07, 2023        Follow-up Information     Seabron Lenis, MD. Schedule an appointment as soon as possible for a visit in 1 week(s).   Specialty: Family Medicine Contact information: [phone] [address] [place] [address] [phone]                Not on File   Other Procedures/Studies: ECHOCARDIOGRAM COMPLETE Result Date: 11/04/2023    ECHOCARDIOGRAM REPORT   Patient Name:   Juan Hanson Date of Exam: 11/04/2023 Medical Rec #:  [id number]       Height:       76.0 in Accession #:    [id number]      Weight:       340.2 lb Date of Birth:  1956-04-23       BSA:          2.776 m Patient Age:    67 years        BP:           120/70 mmHg Patient Gender: M               HR:           69 bpm. Exam Location:  Inpatient Procedure: 2D Echo, Cardiac Doppler, Color Doppler and Intracardiac            Opacification Agent Indications:    Dyspnea  History:        Patient has  no prior history of Echocardiogram examinations.  Pacemaker; Risk Factors:Hypertension.  Sonographer:    Lanell Maduro Referring Phys: 2 [facility] M Everlean Bucher  Sonographer Comments: Technically difficult study due to poor echo windows. IMPRESSIONS  1. Technically difficult study with reduced echo windows  2. Left ventricular ejection fraction, by estimation, is 60 to 65%. Left ventricular ejection fraction by 2D MOD biplane is 61.1 %. The left ventricle has normal function. The left ventricle has no regional wall motion abnormalities. Left ventricular diastolic parameters were normal.  3. Right ventricular systolic function is normal. The right ventricular size is normal. Tricuspid regurgitation signal is inadequate for assessing PA pressure.  4. The aortic valve is tricuspid. Aortic valve regurgitation is not visualized.  5. The mitral valve is grossly normal. No evidence of mitral valve regurgitation. Comparison(s): No prior Echocardiogram. FINDINGS  Left Ventricle: Left ventricular ejection fraction, by estimation, is 60 to 65%. Left ventricular ejection fraction by 2D MOD biplane is 61.1 %. The left ventricle has normal function. The left ventricle has no regional wall motion abnormalities. Definity  contrast agent was given IV to delineate the left ventricular endocardial borders. The left ventricular internal cavity size was normal in size. There is no left ventricular hypertrophy. Abnormal (paradoxical) septal motion, consistent with RV pacemaker. Left ventricular diastolic parameters were normal. Right Ventricle: The right ventricular size is normal. No increase in right ventricular wall thickness. Right ventricular systolic function is normal. Tricuspid regurgitation signal is inadequate for assessing PA pressure. Left Atrium: Left atrial size was normal in size. Right Atrium: Right atrial size was normal in size. Pericardium: There is no evidence of pericardial effusion. Mitral Valve: The  mitral valve is grossly normal. No evidence of mitral valve regurgitation. Tricuspid Valve: The tricuspid valve is not well visualized. Tricuspid valve regurgitation is not demonstrated. Aortic Valve: The aortic valve is tricuspid. Aortic valve regurgitation is not visualized. Pulmonic Valve: The pulmonic valve was normal in structure. Pulmonic valve regurgitation is not visualized. Aorta: The aortic root and ascending aorta are structurally normal, with no evidence of dilitation. IAS/Shunts: No atrial level shunt detected by color flow Doppler. Additional Comments: A device lead is visualized.  LEFT VENTRICLE PLAX 2D                        Biplane EF (MOD) LVIDd:         5.20 cm         LV Biplane EF:   Left LVIDs:         4.60 cm                          ventricular LV PW:         1.10 cm                          ejection LV IVS:        1.00 cm                          fraction by LVOT diam:     2.20 cm                          2D MOD LV SV:         99  biplane is LV SV Index:   36                               61.1 %. LVOT Area:     3.80 cm                                Diastology                                LV e' medial:    6.74 cm/s LV Volumes (MOD)               LV E/e' medial:  12.0 LV vol d, MOD    131.0 ml      LV e' lateral:   6.09 cm/s A2C:                           LV E/e' lateral: 13.3 LV vol d, MOD    253.0 ml A4C: LV vol s, MOD    33.7 ml A2C: LV vol s, MOD    141.0 ml A4C: LV SV MOD A2C:   97.3 ml LV SV MOD A4C:   253.0 ml LV SV MOD BP:    111.2 ml RIGHT VENTRICLE RV S prime:     17.40 cm/s TAPSE (M-mode): 2.3 cm LEFT ATRIUM             Index LA diam:        4.70 cm 1.69 cm/m LA Vol (A2C):   34.7 ml 12.50 ml/m LA Vol (A4C):   36.5 ml 13.15 ml/m LA Biplane Vol: 37.2 ml 13.40 ml/m  AORTIC VALVE LVOT Vmax:   107.00 cm/s LVOT Vmean:  69.200 cm/s LVOT VTI:    0.260 m  AORTA Ao Root diam: 3.60 cm Ao Asc diam:  3.70 cm MITRAL VALVE MV Area (PHT): 3.65 cm    SHUNTS MV  Decel Time: 208 msec    Systemic VTI:  0.26 m MV E velocity: 80.90 cm/s  Systemic Diam: 2.20 cm MV A velocity: 73.00 cm/s MV E/A ratio:  1.11 Vinie Maxcy MD Electronically signed by Vinie Maxcy MD Signature Date/Time: 11/04/2023/12:42:50 PM    Final    DG Chest Port 1 View Result Date: 11/02/2023 CLINICAL DATA:  Shortness of breath EXAM: PORTABLE CHEST 1 VIEW COMPARISON:  10/26/2023 FINDINGS: Cardiomegaly. Left pacer remains in place, unchanged. Mild vascular congestion. Patchy bilateral airspace opacities increased since prior study. No visible effusions or acute bony abnormality. IMPRESSION: Patchy bilateral airspace disease concerning for pneumonia. Cardiomegaly, vascular congestion. Electronically Signed   By: Franky Crease M.D.   On: 11/02/2023 00:31   CT Angio Chest PE W and/or Wo Contrast Result Date: 10/26/2023 CLINICAL DATA:  Shortness of breath, productive cough. EXAM: CT ANGIOGRAPHY CHEST WITH CONTRAST TECHNIQUE: Multidetector CT imaging of the chest was performed using the standard protocol during bolus administration of intravenous contrast. Multiplanar CT image reconstructions and MIPs were obtained to evaluate the vascular anatomy. RADIATION DOSE REDUCTION: This exam was performed according to the departmental dose-optimization program which includes automated exposure control, adjustment of the mA and/or kV according to patient size and/or use of iterative reconstruction technique. CONTRAST:  75mL OMNIPAQUE  IOHEXOL  350 MG/ML SOLN COMPARISON:  None Available. FINDINGS: Cardiovascular: There is no evidence of  large central pulmonary embolus seen in the main pulmonary artery or main portions of the left and right pulmonary arteries. However, due to significant respiratory motion artifact, the lower lobe branches of both pulmonary arteries are not well visualized, and therefore smaller and more peripheral pulmonary emboli cannot be excluded on the basis of this exam. Normal cardiac size. No  pericardial effusion. Mediastinum/Nodes: No enlarged mediastinal, hilar, or axillary lymph nodes. Thyroid  gland, trachea, and esophagus demonstrate no significant findings. Lungs/Pleura: No pneumothorax or pleural effusion is noted. Minimal bibasilar subsegmental atelectasis is noted. Upper Abdomen: Hepatic steatosis. Musculoskeletal: No chest wall abnormality. No acute or significant osseous findings. Review of the MIP images confirms the above findings. IMPRESSION: No definite evidence of large central pulmonary embolus seen in the main pulmonary artery or main portions of the left and right pulmonary arteries. However, due to significant respiratory motion artifact, the lower lobe branches of both pulmonary arteries are not well visualized, and therefore smaller and more peripheral pulmonary emboli cannot be excluded on the basis of this exam. Hepatic steatosis. Electronically Signed   By: Lynwood Landy Raddle M.D.   On: 10/26/2023 10:05   DG Chest 2 View Result Date: 10/26/2023 CLINICAL DATA:  Shortness of breath, cough. EXAM: CHEST - 2 VIEW COMPARISON:  February 14, 2022. FINDINGS: Stable cardiomegaly with mild central pulmonary vascular congestion. Left-sided pacemaker is unchanged. Probable small loculated right pleural effusion or pleural thickening is noted. Bony thorax is unremarkable. IMPRESSION: Stable cardiomegaly with mild central pulmonary vascular congestion. Probable small loculated right pleural effusion or pleural thickening is noted; CT scan is recommended to rule out pleural based mass. Electronically Signed   By: Lynwood Landy Raddle M.D.   On: 10/26/2023 06:19     TODAY-DAY OF DISCHARGE:  Subjective:   Juan Hanson today has no headache,no chest abdominal pain,no new weakness tingling or numbness, feels much better wants to go home today.   Objective:   Blood pressure 134/85, pulse 60, temperature 97.9 F (36.6 C), temperature source Oral, resp. rate (!) 23, height 6' 4 (1.93 m), weight  (!) 157.5 kg, SpO2 90%. No intake or output data in the 24 hours ending 11/06/23 1003 Filed Weights   11/03/23 0414 11/04/23 0353 11/05/23 0500  Weight: (!) 156 kg (!) 154.3 kg (!) 157.5 kg    Exam: Awake Alert, Oriented *3, No new F.N deficits, Normal affect [place].AT,PERRAL Supple Neck,No JVD, No cervical lymphadenopathy appriciated.  Symmetrical Chest wall movement, Good air movement bilaterally, CTAB RRR,No Gallops,Rubs or new Murmurs, No Parasternal Heave +ve B.Sounds, Abd Soft, Non tender, No organomegaly appriciated, No rebound -guarding or rigidity. No Cyanosis, Clubbing or edema, No new Rash or bruise   PERTINENT RADIOLOGIC STUDIES: ECHOCARDIOGRAM COMPLETE Result Date: 11/04/2023    ECHOCARDIOGRAM REPORT   Patient Name:   Juan Hanson Date of Exam: 11/04/2023 Medical Rec #:  [id number]       Height:       76.0 in Accession #:    [id number]      Weight:       340.2 lb Date of Birth:  April 21, 1956       BSA:          2.776 m Patient Age:    67 years        BP:           120/70 mmHg Patient Gender: M               HR:  69 bpm. Exam Location:  Inpatient Procedure: 2D Echo, Cardiac Doppler, Color Doppler and Intracardiac            Opacification Agent Indications:    Dyspnea  History:        Patient has no prior history of Echocardiogram examinations.                 Pacemaker; Risk Factors:Hypertension.  Sonographer:    Lanell Maduro Referring Phys: 20 [facility] M Sunnie Odden  Sonographer Comments: Technically difficult study due to poor echo windows. IMPRESSIONS  1. Technically difficult study with reduced echo windows  2. Left ventricular ejection fraction, by estimation, is 60 to 65%. Left ventricular ejection fraction by 2D MOD biplane is 61.1 %. The left ventricle has normal function. The left ventricle has no regional wall motion abnormalities. Left ventricular diastolic parameters were normal.  3. Right ventricular systolic function is normal. The right ventricular size is normal.  Tricuspid regurgitation signal is inadequate for assessing PA pressure.  4. The aortic valve is tricuspid. Aortic valve regurgitation is not visualized.  5. The mitral valve is grossly normal. No evidence of mitral valve regurgitation. Comparison(s): No prior Echocardiogram. FINDINGS  Left Ventricle: Left ventricular ejection fraction, by estimation, is 60 to 65%. Left ventricular ejection fraction by 2D MOD biplane is 61.1 %. The left ventricle has normal function. The left ventricle has no regional wall motion abnormalities. Definity  contrast agent was given IV to delineate the left ventricular endocardial borders. The left ventricular internal cavity size was normal in size. There is no left ventricular hypertrophy. Abnormal (paradoxical) septal motion, consistent with RV pacemaker. Left ventricular diastolic parameters were normal. Right Ventricle: The right ventricular size is normal. No increase in right ventricular wall thickness. Right ventricular systolic function is normal. Tricuspid regurgitation signal is inadequate for assessing PA pressure. Left Atrium: Left atrial size was normal in size. Right Atrium: Right atrial size was normal in size. Pericardium: There is no evidence of pericardial effusion. Mitral Valve: The mitral valve is grossly normal. No evidence of mitral valve regurgitation. Tricuspid Valve: The tricuspid valve is not well visualized. Tricuspid valve regurgitation is not demonstrated. Aortic Valve: The aortic valve is tricuspid. Aortic valve regurgitation is not visualized. Pulmonic Valve: The pulmonic valve was normal in structure. Pulmonic valve regurgitation is not visualized. Aorta: The aortic root and ascending aorta are structurally normal, with no evidence of dilitation. IAS/Shunts: No atrial level shunt detected by color flow Doppler. Additional Comments: A device lead is visualized.  LEFT VENTRICLE PLAX 2D                        Biplane EF (MOD) LVIDd:         5.20 cm         LV  Biplane EF:   Left LVIDs:         4.60 cm                          ventricular LV PW:         1.10 cm                          ejection LV IVS:        1.00 cm                          fraction by  LVOT diam:     2.20 cm                          2D MOD LV SV:         99                               biplane is LV SV Index:   36                               61.1 %. LVOT Area:     3.80 cm                                Diastology                                LV e' medial:    6.74 cm/s LV Volumes (MOD)               LV E/e' medial:  12.0 LV vol d, MOD    131.0 ml      LV e' lateral:   6.09 cm/s A2C:                           LV E/e' lateral: 13.3 LV vol d, MOD    253.0 ml A4C: LV vol s, MOD    33.7 ml A2C: LV vol s, MOD    141.0 ml A4C: LV SV MOD A2C:   97.3 ml LV SV MOD A4C:   253.0 ml LV SV MOD BP:    111.2 ml RIGHT VENTRICLE RV S prime:     17.40 cm/s TAPSE (M-mode): 2.3 cm LEFT ATRIUM             Index LA diam:        4.70 cm 1.69 cm/m LA Vol (A2C):   34.7 ml 12.50 ml/m LA Vol (A4C):   36.5 ml 13.15 ml/m LA Biplane Vol: 37.2 ml 13.40 ml/m  AORTIC VALVE LVOT Vmax:   107.00 cm/s LVOT Vmean:  69.200 cm/s LVOT VTI:    0.260 m  AORTA Ao Root diam: 3.60 cm Ao Asc diam:  3.70 cm MITRAL VALVE MV Area (PHT): 3.65 cm    SHUNTS MV Decel Time: 208 msec    Systemic VTI:  0.26 m MV E velocity: 80.90 cm/s  Systemic Diam: 2.20 cm MV A velocity: 73.00 cm/s MV E/A ratio:  1.11 Vinie Maxcy MD Electronically signed by Vinie Maxcy MD Signature Date/Time: 11/04/2023/12:42:50 PM    Final      PERTINENT LAB RESULTS: CBC: Recent Labs    11/04/23 0456  WBC 5.9  HGB 13.7  HCT 40.4  PLT 202   CMET CMP     Component Value Date/Time   NA 134 (L) 11/05/2023 0451   NA 142 12/12/2016 1543   K 4.2 11/05/2023 0451   CL 99 11/05/2023 0451   CO2 24 11/05/2023 0451   GLUCOSE 316 (H) 11/05/2023 0451   BUN 34 (H) 11/05/2023 0451   BUN 18 12/12/2016 1543   CREATININE 1.25 (H) 11/05/2023 0451   CALCIUM  8.2 (L) 11/05/2023  0451   PROT 6.8 11/03/2023 0533  ALBUMIN 3.1 (L) 11/03/2023 0533   AST 39 11/03/2023 0533   ALT 39 11/03/2023 0533   ALKPHOS 53 11/03/2023 0533   BILITOT 0.8 11/03/2023 0533   GFRNONAA >60 11/05/2023 0451    GFR Estimated Creatinine Clearance: 94.6 mL/min (A) (by C-G formula based on SCr of 1.25 mg/dL (H)). No results for input(s): LIPASE, AMYLASE in the last 72 hours. No results for input(s): CKTOTAL, CKMB, CKMBINDEX, TROPONINI in the last 72 hours. Invalid input(s): POCBNP Recent Labs    11/03/23 1036  DDIMER 0.63*   Recent Labs    11/03/23 1036  HGBA1C 8.6*   No results for input(s): CHOL, HDL, LDLCALC, TRIG, CHOLHDL, LDLDIRECT in the last 72 hours. No results for input(s): TSH, T4TOTAL, T3FREE, THYROIDAB in the last 72 hours.  Invalid input(s): FREET3 No results for input(s): VITAMINB12, FOLATE, FERRITIN, TIBC, IRON, RETICCTPCT in the last 72 hours. Coags: No results for input(s): INR in the last 72 hours.  Invalid input(s): PT Microbiology: No results found for this or any previous visit (from the past 240 hours).  FURTHER DISCHARGE INSTRUCTIONS:  Get Medicines reviewed and adjusted: Please take all your medications with you for your next visit with your Primary MD  Laboratory/radiological data: Please request your Primary MD to go over all hospital tests and procedure/radiological results at the follow up, please ask your Primary MD to get all Hospital records sent to his/her office.  In some cases, they will be blood work, cultures and biopsy results pending at the time of your discharge. Please request that your primary care M.D. goes through all the records of your hospital data and follows up on these results.  Also Note the following: If you experience worsening of your admission symptoms, develop shortness of breath, life threatening emergency, suicidal or homicidal thoughts you must seek medical  attention immediately by calling 911 or calling your MD immediately  if symptoms less severe.  You must read complete instructions/literature along with all the possible adverse reactions/side effects for all the Medicines you take and that have been prescribed to you. Take any new Medicines after you have completely understood and accpet all the possible adverse reactions/side effects.   Do not drive when taking Pain medications or sleeping medications (Benzodaizepines)  Do not take more than prescribed Pain, Sleep and Anxiety Medications. It is not advisable to combine anxiety,sleep and pain medications without talking with your primary care practitioner  Special Instructions: If you have smoked or chewed Tobacco  in the last 2 yrs please stop smoking, stop any regular Alcohol  and or any Recreational drug use.  Wear Seat belts while driving.  Please note: You were cared for by a hospitalist during your hospital stay. Once you are discharged, your primary care physician will handle any further medical issues. Please note that NO REFILLS for any discharge medications will be authorized once you are discharged, as it is imperative that you return to your primary care physician (or establish a relationship with a primary care physician if you do not have one) for your post hospital discharge needs so that they can reassess your need for medications and monitor your lab values.  Total Time spent coordinating discharge including counseling, education and face to face time equals greater than 30 minutes.  Signed: Micheal Sheen 11/06/2023 10:03 AM

## 2023-11-06 NOTE — Progress Notes (Signed)
 Mobility Specialist Progress Note;   11/06/23 0945  Mobility  Activity Ambulated independently in room  Level of Assistance Independent  Assistive Device None  Distance Ambulated (ft) 15 ft  Activity Response Tolerated well  Mobility Referral Yes  Mobility visit 1 Mobility  Mobility Specialist Start Time (ACUTE ONLY) 0945  Mobility Specialist Stop Time (ACUTE ONLY) [id number]  Mobility Specialist Time Calculation (min) (ACUTE ONLY) 7 min   Pt agreeable to mobility. On RA upon arrival. Ambulated on RA in room, SPO2 90%> throughout. No c/o during session. Pt returned back to chair with all needs met.   Lauraine Erm Mobility Specialist Please contact via SecureChat or [employer] [phone]

## 2023-11-06 NOTE — Inpatient Diabetes Management (Signed)
 Inpatient Diabetes Program Recommendations  AACE/ADA: New Consensus Statement on Inpatient Glycemic Control (2015)  Target Ranges:  Prepandial:   less than 140 mg/dL      Peak postprandial:   less than 180 mg/dL (1-2 hours)      Critically ill patients:  140 - 180 mg/dL   Lab Results  Component Value Date   GLUCAP 223 (H) 11/06/2023   HGBA1C 8.6 (H) 11/03/2023   Met with patietn again to review discharge orders. Discussed impact of steroids on glucose trends, how to check CBGs, when to follow up and reviewed sliding scale.  Educated patient on insulin  pen use at home. Reviewed contents of insulin  flexpen starter kit. Reviewed all steps if insulin  pen including attachment of needle, 2-unit air shot, dialing up dose, giving injection, removing needle, disposal of sharps, storage of unused insulin , disposal of insulin  etc. Patient able to provide successful return demonstration. Also reviewed troubleshooting with insulin  pen. MD to give patient Rxs for insulin  pens and insulin  pen needles. No further questions at this time.   Thanks, Tinnie Minus, MSN, RNC-OB Diabetes Coordinator [phone] (8a-5p)

## 2023-11-06 NOTE — TOC Transition Note (Signed)
 Transition of Care [facility]) - Discharge Note   Patient Details  Name: Juan Hanson MRN: [id number] Date of Birth: 1956/07/02  Transition of Care [facility]) CM/SW Contact:  Hendricks KANDICE Her, RN Phone Number: 11/06/2023, 10:06 AM   Clinical Narrative:     Patient will DC to home today. No TOC needs identified  Patient will follow up as directed on AVS. Family to transport           Patient Goals and CMS Choice            Discharge Placement                       Discharge Plan and Services Additional resources added to the After Visit Summary for                                       Social Drivers of Health (SDOH) Interventions SDOH Screenings   Food Insecurity: No Food Insecurity (11/02/2023)  Housing: Unknown (11/02/2023)  Transportation Needs: No Transportation Needs (11/02/2023)  Utilities: Not At Risk (11/02/2023)  Social Connections: Unknown (11/03/2023)  Tobacco Use: Medium Risk (11/02/2023)     Readmission Risk Interventions     No data to display

## 2023-11-11 DIAGNOSIS — R5381 Other malaise: Secondary | ICD-10-CM | POA: Diagnosis not present

## 2023-11-11 DIAGNOSIS — E1169 Type 2 diabetes mellitus with other specified complication: Secondary | ICD-10-CM | POA: Diagnosis not present

## 2023-11-11 DIAGNOSIS — Z8616 Personal history of COVID-19: Secondary | ICD-10-CM | POA: Diagnosis not present

## 2023-11-11 DIAGNOSIS — R944 Abnormal results of kidney function studies: Secondary | ICD-10-CM | POA: Diagnosis not present

## 2023-11-14 ENCOUNTER — Ambulatory Visit (INDEPENDENT_AMBULATORY_CARE_PROVIDER_SITE_OTHER): Payer: Medicare Other

## 2023-11-14 DIAGNOSIS — I441 Atrioventricular block, second degree: Secondary | ICD-10-CM | POA: Diagnosis not present

## 2023-11-17 LAB — CUP PACEART REMOTE DEVICE CHECK
Battery Remaining Longevity: 129 mo
Battery Voltage: 3.03 V
Brady Statistic AP VP Percent: 24.99 %
Brady Statistic AP VS Percent: 0 %
Brady Statistic AS VP Percent: 74.67 %
Brady Statistic AS VS Percent: 0.35 %
Brady Statistic RA Percent Paced: 25.1 %
Brady Statistic RV Percent Paced: 99.65 %
Date Time Interrogation Session: 20250117115749
Implantable Lead Connection Status: 753985
Implantable Lead Connection Status: 753985
Implantable Lead Implant Date: 20180220
Implantable Lead Implant Date: 20230418
Implantable Lead Location: 753859
Implantable Lead Location: 753860
Implantable Lead Model: 5076
Implantable Lead Model: 5076
Implantable Pulse Generator Implant Date: 20230418
Lead Channel Impedance Value: 304 Ohm
Lead Channel Impedance Value: 323 Ohm
Lead Channel Impedance Value: 380 Ohm
Lead Channel Impedance Value: 456 Ohm
Lead Channel Pacing Threshold Amplitude: 0.5 V
Lead Channel Pacing Threshold Amplitude: 0.625 V
Lead Channel Pacing Threshold Pulse Width: 0.4 ms
Lead Channel Pacing Threshold Pulse Width: 0.4 ms
Lead Channel Sensing Intrinsic Amplitude: 2.125 mV
Lead Channel Sensing Intrinsic Amplitude: 2.125 mV
Lead Channel Sensing Intrinsic Amplitude: 8.125 mV
Lead Channel Sensing Intrinsic Amplitude: 8.125 mV
Lead Channel Setting Pacing Amplitude: 1.5 V
Lead Channel Setting Pacing Amplitude: 2 V
Lead Channel Setting Pacing Pulse Width: 0.4 ms
Lead Channel Setting Sensing Sensitivity: 4 mV
Zone Setting Status: 755011
Zone Setting Status: 755011

## 2023-11-18 ENCOUNTER — Encounter: Payer: Self-pay | Admitting: Internal Medicine

## 2023-12-20 ENCOUNTER — Other Ambulatory Visit (HOSPITAL_COMMUNITY): Payer: Self-pay

## 2023-12-20 DIAGNOSIS — R972 Elevated prostate specific antigen [PSA]: Secondary | ICD-10-CM | POA: Diagnosis not present

## 2023-12-20 DIAGNOSIS — N400 Enlarged prostate without lower urinary tract symptoms: Secondary | ICD-10-CM | POA: Diagnosis not present

## 2023-12-24 NOTE — Progress Notes (Signed)
 Remote pacemaker transmission.

## 2023-12-24 NOTE — Addendum Note (Signed)
 Addended by: Elease Etienne A on: 12/24/2023 10:05 AM   Modules accepted: Orders

## 2023-12-25 ENCOUNTER — Other Ambulatory Visit: Payer: Self-pay | Admitting: Urology

## 2023-12-25 DIAGNOSIS — R972 Elevated prostate specific antigen [PSA]: Secondary | ICD-10-CM

## 2024-01-06 ENCOUNTER — Other Ambulatory Visit (HOSPITAL_COMMUNITY): Payer: Self-pay | Admitting: Urology

## 2024-01-06 DIAGNOSIS — R972 Elevated prostate specific antigen [PSA]: Secondary | ICD-10-CM

## 2024-02-04 ENCOUNTER — Ambulatory Visit (HOSPITAL_COMMUNITY)

## 2024-02-11 DIAGNOSIS — E1165 Type 2 diabetes mellitus with hyperglycemia: Secondary | ICD-10-CM | POA: Diagnosis not present

## 2024-02-11 DIAGNOSIS — Z0000 Encounter for general adult medical examination without abnormal findings: Secondary | ICD-10-CM | POA: Diagnosis not present

## 2024-02-11 DIAGNOSIS — G4733 Obstructive sleep apnea (adult) (pediatric): Secondary | ICD-10-CM | POA: Diagnosis not present

## 2024-02-11 DIAGNOSIS — R609 Edema, unspecified: Secondary | ICD-10-CM | POA: Diagnosis not present

## 2024-02-11 DIAGNOSIS — I10 Essential (primary) hypertension: Secondary | ICD-10-CM | POA: Diagnosis not present

## 2024-02-13 ENCOUNTER — Ambulatory Visit (INDEPENDENT_AMBULATORY_CARE_PROVIDER_SITE_OTHER): Payer: Medicare Other

## 2024-02-13 DIAGNOSIS — I441 Atrioventricular block, second degree: Secondary | ICD-10-CM | POA: Diagnosis not present

## 2024-02-14 ENCOUNTER — Other Ambulatory Visit (HOSPITAL_COMMUNITY): Payer: Self-pay

## 2024-02-14 LAB — CUP PACEART REMOTE DEVICE CHECK
Battery Remaining Longevity: 127 mo
Battery Voltage: 3.03 V
Brady Statistic AP VP Percent: 13.58 %
Brady Statistic AP VS Percent: 0 %
Brady Statistic AS VP Percent: 86.33 %
Brady Statistic AS VS Percent: 0.1 %
Brady Statistic RA Percent Paced: 13.57 %
Brady Statistic RV Percent Paced: 99.9 %
Date Time Interrogation Session: 20250416224441
Implantable Lead Connection Status: 753985
Implantable Lead Connection Status: 753985
Implantable Lead Implant Date: 20180220
Implantable Lead Implant Date: 20230418
Implantable Lead Location: 753859
Implantable Lead Location: 753860
Implantable Lead Model: 5076
Implantable Lead Model: 5076
Implantable Pulse Generator Implant Date: 20230418
Lead Channel Impedance Value: 304 Ohm
Lead Channel Impedance Value: 342 Ohm
Lead Channel Impedance Value: 380 Ohm
Lead Channel Impedance Value: 456 Ohm
Lead Channel Pacing Threshold Amplitude: 0.5 V
Lead Channel Pacing Threshold Amplitude: 0.75 V
Lead Channel Pacing Threshold Pulse Width: 0.4 ms
Lead Channel Pacing Threshold Pulse Width: 0.4 ms
Lead Channel Sensing Intrinsic Amplitude: 2.375 mV
Lead Channel Sensing Intrinsic Amplitude: 2.375 mV
Lead Channel Sensing Intrinsic Amplitude: 8.125 mV
Lead Channel Sensing Intrinsic Amplitude: 8.125 mV
Lead Channel Setting Pacing Amplitude: 1.5 V
Lead Channel Setting Pacing Amplitude: 2 V
Lead Channel Setting Pacing Pulse Width: 0.4 ms
Lead Channel Setting Sensing Sensitivity: 4 mV
Zone Setting Status: 755011
Zone Setting Status: 755011

## 2024-02-14 MED ORDER — OZEMPIC (0.25 OR 0.5 MG/DOSE) 2 MG/3ML ~~LOC~~ SOPN
0.2500 mg | PEN_INJECTOR | SUBCUTANEOUS | 0 refills | Status: DC
  Filled 2024-02-14: qty 3, 28d supply, fill #0

## 2024-02-18 ENCOUNTER — Encounter: Payer: Self-pay | Admitting: Internal Medicine

## 2024-03-03 ENCOUNTER — Ambulatory Visit (HOSPITAL_COMMUNITY)
Admission: RE | Admit: 2024-03-03 | Discharge: 2024-03-03 | Disposition: A | Discharge status: Home / Self Care | Source: Ambulatory Visit | Attending: Urology | Admitting: Urology

## 2024-03-03 DIAGNOSIS — R972 Elevated prostate specific antigen [PSA]: Secondary | ICD-10-CM | POA: Insufficient documentation

## 2024-03-03 DIAGNOSIS — N4289 Other specified disorders of prostate: Secondary | ICD-10-CM | POA: Diagnosis not present

## 2024-03-03 DIAGNOSIS — N400 Enlarged prostate without lower urinary tract symptoms: Secondary | ICD-10-CM | POA: Diagnosis not present

## 2024-03-03 MED ORDER — GADOBUTROL 1 MMOL/ML IV SOLN
10.0000 mL | Freq: Once | INTRAVENOUS | Status: AC | PRN
  Administered 2024-03-03: 10 mL via INTRAVENOUS

## 2024-03-03 NOTE — CV Procedure (Signed)
  Device system confirmed to be MRI conditional, with implant date > 6 weeks ago, and no evidence of abandoned or epicardial leads in review of most recent CXR  Device last cleared by EP Provider: Mertha Abrahams 02/27/2024   Clearance is good through for 1 year as long as parameters remain stable at time of check. If pt undergoes a cardiac device procedure during that time, they should be re-cleared.   Tachy-therapies to be programmed off if applicable with device back to pre-MRI settings after completion of exam.  [employer] - Programming recommendation received through [employer] App/Tablet  [employer], RT  03/03/2024 9:00 AM

## 2024-03-16 ENCOUNTER — Other Ambulatory Visit (HOSPITAL_COMMUNITY): Payer: Self-pay

## 2024-03-16 MED ORDER — FREESTYLE LIBRE 3 PLUS SENSOR MISC
11 refills | Status: AC
  Filled 2024-03-16: qty 2, 30d supply, fill #0
  Filled 2024-04-12: qty 2, 30d supply, fill #1
  Filled 2024-05-10: qty 2, 30d supply, fill #2
  Filled 2024-06-12: qty 2, 30d supply, fill #3
  Filled 2024-07-07: qty 2, 30d supply, fill #4
  Filled 2024-07-22 – 2024-08-06 (×2): qty 2, 30d supply, fill #5
  Filled 2024-09-05: qty 2, 30d supply, fill #6
  Filled 2024-10-07: qty 2, 30d supply, fill #7
  Filled 2024-11-06: qty 2, 30d supply, fill #8

## 2024-03-17 ENCOUNTER — Other Ambulatory Visit (HOSPITAL_COMMUNITY): Payer: Self-pay

## 2024-03-17 MED ORDER — OZEMPIC (0.25 OR 0.5 MG/DOSE) 2 MG/3ML ~~LOC~~ SOPN
0.5000 mg | PEN_INJECTOR | SUBCUTANEOUS | 0 refills | Status: DC
Start: 2024-03-16 — End: 2024-06-02
  Filled 2024-03-17: qty 3, 28d supply, fill #0

## 2024-03-25 NOTE — Addendum Note (Signed)
 Addended by: Lott Rouleau A on: 03/25/2024 01:00 PM   Modules accepted: Orders

## 2024-03-25 NOTE — Progress Notes (Signed)
 Remote pacemaker transmission.

## 2024-04-20 ENCOUNTER — Other Ambulatory Visit (HOSPITAL_COMMUNITY): Payer: Self-pay

## 2024-04-20 MED ORDER — OZEMPIC (1 MG/DOSE) 4 MG/3ML ~~LOC~~ SOPN
1.0000 mg | PEN_INJECTOR | SUBCUTANEOUS | 0 refills | Status: DC
  Filled 2024-04-20: qty 3, 28d supply, fill #0

## 2024-04-28 DIAGNOSIS — C61 Malignant neoplasm of prostate: Secondary | ICD-10-CM

## 2024-04-28 HISTORY — DX: Malignant neoplasm of prostate: C61

## 2024-04-29 DIAGNOSIS — D075 Carcinoma in situ of prostate: Secondary | ICD-10-CM | POA: Diagnosis not present

## 2024-04-29 DIAGNOSIS — C61 Malignant neoplasm of prostate: Secondary | ICD-10-CM | POA: Diagnosis not present

## 2024-04-29 DIAGNOSIS — N411 Chronic prostatitis: Secondary | ICD-10-CM | POA: Diagnosis not present

## 2024-05-06 DIAGNOSIS — N400 Enlarged prostate without lower urinary tract symptoms: Secondary | ICD-10-CM | POA: Diagnosis not present

## 2024-05-06 DIAGNOSIS — C61 Malignant neoplasm of prostate: Secondary | ICD-10-CM | POA: Diagnosis not present

## 2024-05-12 ENCOUNTER — Other Ambulatory Visit (HOSPITAL_COMMUNITY): Payer: Self-pay

## 2024-05-12 MED ORDER — AMLODIPINE BESYLATE 10 MG PO TABS
10.0000 mg | ORAL_TABLET | Freq: Every day | ORAL | 0 refills | Status: DC
  Filled 2024-05-12: qty 90, 90d supply, fill #0

## 2024-05-12 MED ORDER — LOSARTAN POTASSIUM 50 MG PO TABS
50.0000 mg | ORAL_TABLET | Freq: Every day | ORAL | 0 refills | Status: DC
  Filled 2024-05-12: qty 90, 90d supply, fill #0

## 2024-05-13 ENCOUNTER — Other Ambulatory Visit (HOSPITAL_COMMUNITY): Payer: Self-pay

## 2024-05-14 ENCOUNTER — Ambulatory Visit: Payer: Medicare Other

## 2024-05-14 DIAGNOSIS — I441 Atrioventricular block, second degree: Secondary | ICD-10-CM

## 2024-05-14 LAB — CUP PACEART REMOTE DEVICE CHECK
Battery Remaining Longevity: 125 mo
Battery Voltage: 3.02 V
Brady Statistic AP VP Percent: 4.64 %
Brady Statistic AP VS Percent: 0 %
Brady Statistic AS VP Percent: 95.19 %
Brady Statistic AS VS Percent: 0.17 %
Brady Statistic RA Percent Paced: 4.66 %
Brady Statistic RV Percent Paced: 99.83 %
Date Time Interrogation Session: 20250716234519
Implantable Lead Connection Status: 753985
Implantable Lead Connection Status: 753985
Implantable Lead Implant Date: 20180220
Implantable Lead Implant Date: 20230418
Implantable Lead Location: 753859
Implantable Lead Location: 753860
Implantable Lead Model: 5076
Implantable Lead Model: 5076
Implantable Pulse Generator Implant Date: 20230418
Lead Channel Impedance Value: 304 Ohm
Lead Channel Impedance Value: 361 Ohm
Lead Channel Impedance Value: 380 Ohm
Lead Channel Impedance Value: 475 Ohm
Lead Channel Pacing Threshold Amplitude: 0.5 V
Lead Channel Pacing Threshold Amplitude: 0.75 V
Lead Channel Pacing Threshold Pulse Width: 0.4 ms
Lead Channel Pacing Threshold Pulse Width: 0.4 ms
Lead Channel Sensing Intrinsic Amplitude: 2.5 mV
Lead Channel Sensing Intrinsic Amplitude: 2.5 mV
Lead Channel Sensing Intrinsic Amplitude: 8.125 mV
Lead Channel Sensing Intrinsic Amplitude: 8.125 mV
Lead Channel Setting Pacing Amplitude: 1.5 V
Lead Channel Setting Pacing Amplitude: 2 V
Lead Channel Setting Pacing Pulse Width: 0.4 ms
Lead Channel Setting Sensing Sensitivity: 4 mV
Zone Setting Status: 755011
Zone Setting Status: 755011

## 2024-05-18 ENCOUNTER — Ambulatory Visit: Payer: Self-pay | Admitting: Internal Medicine

## 2024-05-18 ENCOUNTER — Other Ambulatory Visit (HOSPITAL_COMMUNITY): Payer: Self-pay

## 2024-05-18 DIAGNOSIS — G4733 Obstructive sleep apnea (adult) (pediatric): Secondary | ICD-10-CM | POA: Diagnosis not present

## 2024-05-18 MED ORDER — OZEMPIC (2 MG/DOSE) 8 MG/3ML ~~LOC~~ SOPN
2.0000 mg | PEN_INJECTOR | SUBCUTANEOUS | 0 refills | Status: DC
  Filled 2024-05-18: qty 3, 28d supply, fill #0

## 2024-05-22 NOTE — Progress Notes (Signed)
 GU Location of Tumor / Histology: Prostate Ca  If Prostate Cancer, Gleason Score is (4 + 3) and PSA is (6.09 on 10/25/2023)  Juan Hanson presented as referral from Dr. Donnice Brooks [facility]) elevated PSA.  Biopsies      03/03/2024 Dr. Donnice Brooks MR Prostate with/without Contrast CLINICAL DATA: Elevated PSA level. R97.20   IMPRESSION: 1. PI-RADS category 5 lesion of the bilateral posteromedial and right posterolateral peripheral zone in the mid gland and apex. 2. Three PI-RADS category 4 lesions of the peripheral zone. 3.  Targeting data sent to [facility]. 4. Prostatomegaly and benign prostatic hypertrophy.    Past/Anticipated interventions by urology, if any:   Past/Anticipated interventions by medical oncology, if any:  NA  Weight changes, if any: No  IPSS:  1 SHIM:  22  Bowel/Bladder complaints, if any:  No  Nausea/Vomiting, if any: No  Pain issues, if any:  0/10  SAFETY ISSUES: Prior radiation? No Pacemaker/ICD? Yes, pacemaker Possible current pregnancy? Male Is the patient on methotrexate? No  Current Complaints / other details:  Nicotine patches for mer smoker.  30 minutes spent total, including time for meaningful use questions, reviewing medication, as well as spent in face-to-face time in nurse evaluation with the patient.

## 2024-06-01 DIAGNOSIS — R972 Elevated prostate specific antigen [PSA]: Secondary | ICD-10-CM | POA: Diagnosis not present

## 2024-06-02 ENCOUNTER — Ambulatory Visit
Admission: RE | Admit: 2024-06-02 | Discharge: 2024-06-02 | Disposition: A | Discharge status: Home / Self Care | Source: Ambulatory Visit | Attending: Radiation Oncology | Admitting: Radiation Oncology

## 2024-06-02 ENCOUNTER — Encounter: Payer: Self-pay | Admitting: Radiation Oncology

## 2024-06-02 VITALS — BP 166/84 | HR 95 | Temp 97.1°F | Resp 18 | Ht 76.0 in | Wt 371.5 lb

## 2024-06-02 DIAGNOSIS — Z801 Family history of malignant neoplasm of trachea, bronchus and lung: Secondary | ICD-10-CM | POA: Insufficient documentation

## 2024-06-02 DIAGNOSIS — I10 Essential (primary) hypertension: Secondary | ICD-10-CM | POA: Insufficient documentation

## 2024-06-02 DIAGNOSIS — Z7984 Long term (current) use of oral hypoglycemic drugs: Secondary | ICD-10-CM | POA: Insufficient documentation

## 2024-06-02 DIAGNOSIS — Z800 Family history of malignant neoplasm of digestive organs: Secondary | ICD-10-CM | POA: Diagnosis not present

## 2024-06-02 DIAGNOSIS — Z79899 Other long term (current) drug therapy: Secondary | ICD-10-CM | POA: Insufficient documentation

## 2024-06-02 DIAGNOSIS — G4730 Sleep apnea, unspecified: Secondary | ICD-10-CM | POA: Diagnosis not present

## 2024-06-02 DIAGNOSIS — I4510 Unspecified right bundle-branch block: Secondary | ICD-10-CM | POA: Insufficient documentation

## 2024-06-02 DIAGNOSIS — Z191 Hormone sensitive malignancy status: Secondary | ICD-10-CM | POA: Diagnosis not present

## 2024-06-02 DIAGNOSIS — Z8701 Personal history of pneumonia (recurrent): Secondary | ICD-10-CM | POA: Insufficient documentation

## 2024-06-02 DIAGNOSIS — C61 Malignant neoplasm of prostate: Secondary | ICD-10-CM | POA: Insufficient documentation

## 2024-06-02 DIAGNOSIS — Z87891 Personal history of nicotine dependence: Secondary | ICD-10-CM | POA: Diagnosis not present

## 2024-06-02 HISTORY — DX: Elevated prostate specific antigen (PSA): R97.20

## 2024-06-02 NOTE — Progress Notes (Signed)
 Introduced myself to the patient as the prostate nurse navigator.  He is here to discuss his radiation treatment options and will proceed with 5.5 weeks of daily radiation.  PSMA PET pending authorization at this time. I gave him my business card and asked him to call me with questions or concerns.  Verbalized understanding.

## 2024-06-05 ENCOUNTER — Other Ambulatory Visit: Payer: Self-pay | Admitting: Urology

## 2024-06-05 DIAGNOSIS — C61 Malignant neoplasm of prostate: Secondary | ICD-10-CM | POA: Insufficient documentation

## 2024-06-05 NOTE — Progress Notes (Signed)
 Radiation Oncology         [phone] ________________________________  Initial Outpatient Consultation  Name: Juan Hanson MRN: [id number]  Date: 06/02/2024  DOB: 1956/02/14  RR:Dtjbwz, Alm, MD  Nieves Cough, MD   REFERRING PHYSICIAN: Nieves Cough, MD  DIAGNOSIS: 68 y.o. gentleman with Stage T1c adenocarcinoma of the prostate with Gleason score of 4+3, and PSA of 7.09.    ICD-10-CM   1. Malignant neoplasm of prostate (HCC)  C61       HISTORY OF PRESENT ILLNESS: Juan Hanson is a 68 y.o. male with a diagnosis of prostate cancer.  He has a history of elevated PSA since 2022 when his PSA was 4.9 in March of that year.  His PSA remained stable elevated at 4.09 at his initial urology visit with Dr. Rosalind on January 25, 2021 and DRE was without any concerning findings.  He continued with close monitoring and his PSA fluctuated between 5.5 and 6.4 through December 2024.  A prostate MRI on 03/03/2024 showed 4 suspicious lesions-a PI-RADS 5 lesion in the bilateral posterior medial and right posterior lateral peripheral zone in the mid gland and apex as well as 3 PI-RADS 4 lesions in the bilateral peripheral zone..  The patient proceeded to MRI fusion transrectal ultrasound with 20 biopsies of the prostate on 04/29/2024.  The prostate volume measured 67 cc.  Out of 20 core biopsies, 9 were positive.  The maximum Gleason score was 4+3, and this was seen in the left mid lateral, left base lateral and 2 of 2 cores from one of the MRI ROIs on the left.  Additionally, Gleason 3+4 was seen in the right apex lateral and Gleason 3+3 in all other positive cores.  His most recent PSA on 06/01/2024 was further elevated at 7.09.   The patient reviewed the biopsy results with his urologist and he has kindly been referred today for discussion of potential radiation treatment options.   PREVIOUS RADIATION THERAPY: No  PAST MEDICAL HISTORY:  Past Medical History:  Diagnosis Date   AV block, 2nd  degree    /notes 12/18/2016   Elevated PSA    Headache    had them when I woke up before getting my CPAP; never had migraines (12/18/2016)   HTN (hypertension)    OSA on CPAP    Pneumonia ~ 2012   Presence of permanent cardiac pacemaker    Right bundle branch block    /notes 12/18/2016      PAST SURGICAL HISTORY: Past Surgical History:  Procedure Laterality Date   COLONOSCOPY  X 2   INSERT / REPLACE / REMOVE PACEMAKER  12/18/2016   PACEMAKER IMPLANT N/A 12/18/2016   Procedure: Pacemaker Implant;  Surgeon: Danelle LELON Birmingham, MD;  Location: [facility] INVASIVE CV LAB;  Service: Cardiovascular;  Laterality: N/A;   PACEMAKER LEAD REMOVAL N/A 02/13/2022   Procedure: PACEMAKER LEAD REMOVAL, PACEMAKER LEAD INCERTION, PACEMAKER GENERATOR CHANGE;  Surgeon: Birmingham Danelle LELON, MD;  Location: [facility] OR;  Service: Cardiovascular;  Laterality: N/A;   PROSTATE BIOPSY      FAMILY HISTORY:  Family History  Problem Relation Age of Onset   Lung cancer Mother    Arthritis Mother    Colon cancer Father    Lung cancer Father    Healthy Brother        x3   Healthy Sister     SOCIAL HISTORY:  Social History   Socioeconomic History   Marital status: Divorced    Spouse name: Not on  file   Number of children: 2   Years of education: Not on file   Highest education level: Not on file  Occupational History   Not on file  Tobacco Use   Smoking status: Never   Smokeless tobacco: Former    Types: Snuff, [place]    Quit date: 06/11/2016  Vaping Use   Vaping status: Never Used  Substance and Sexual Activity   Alcohol use: Not Currently    Comment: 12/18/2016 <10 drinks/year   Drug use: No   Sexual activity: Not Currently  Other Topics Concern   Not on file  Social History Narrative   Not on file   Social Drivers of Health   Financial Resource Strain: Not on file  Food Insecurity: No Food Insecurity (06/02/2024)   Hunger Vital Sign    Worried About Running Out of Food in the Last Year: Never true    Ran  Out of Food in the Last Year: Never true  Transportation Needs: No Transportation Needs (06/02/2024)   PRAPARE - Administrator, Civil Service (Medical): No    Lack of Transportation (Non-Medical): No  Physical Activity: Not on file  Stress: Not on file  Social Connections: Unknown (11/03/2023)   Social Connection and Isolation Panel    Frequency of Communication with Friends and Family: More than three times a week    Frequency of Social Gatherings with Friends and Family: Once a week    Attends Religious Services: Patient declined    Database administrator or Organizations: Yes    Attends Banker Meetings: 1 to 4 times per year    Marital Status: Patient unable to answer  Intimate Partner Violence: Not At Risk (06/02/2024)   Humiliation, Afraid, Rape, and Kick questionnaire    Fear of Current or Ex-Partner: No    Emotionally Abused: No    Physically Abused: No    Sexually Abused: No    ALLERGIES: Patient has no known allergies.  MEDICATIONS:  Current Outpatient Medications  Medication Sig Dispense Refill   amLODipine  (NORVASC ) 10 MG tablet Take 1 tablet (10 mg total) by mouth daily. 90 tablet 0   Continuous Glucose Sensor (FREESTYLE LIBRE 3 PLUS SENSOR) MISC Use as directed. Change every 15 days. 2 each 11   losartan  (COZAAR ) 50 MG tablet Take 1 tablet (50 mg total) by mouth daily. 90 tablet 0   metFORMIN  (GLUCOPHAGE ) 500 MG tablet Take 1 tablet (500 mg total) by mouth 2 (two) times daily with a meal. 60 tablet 1   Semaglutide , 2 MG/DOSE, (OZEMPIC , 2 MG/DOSE,) 8 MG/3ML SOPN Inject 2 mg into the skin once a week. 3 mL 0   No current facility-administered medications for this encounter.    REVIEW OF SYSTEMS:  On review of systems, the patient reports that he is doing well overall. He denies any chest pain, shortness of breath, cough, fevers, chills, night sweats, unintended weight changes. He denies any bowel disturbances, and denies abdominal pain, nausea or  vomiting. He denies any new musculoskeletal or joint aches or pains. His IPSS was 1, indicating minimal urinary symptoms. His SHIM was 22, indicating he has mild erectile dysfunction. A complete review of systems is obtained and is otherwise negative.    PHYSICAL EXAM:  Wt Readings from Last 3 Encounters:  06/02/24 (!) 371 lb 8 oz (168.5 kg)  11/05/23 (!) 347 lb 3.6 oz (157.5 kg)  07/16/23 (!) 370 lb (167.8 kg)   Temp Readings from Last 3  Encounters:  06/02/24 (!) 97.1 F (36.2 C) (Temporal)  11/06/23 97.9 F (36.6 C) (Oral)  10/26/23 98.4 F (36.9 C)   BP Readings from Last 3 Encounters:  06/02/24 (!) 166/84  11/06/23 134/85  10/26/23 (!) 142/86   Pulse Readings from Last 3 Encounters:  06/02/24 95  11/06/23 75  10/26/23 98   Pain Assessment Pain Score: 0-No pain/10  In general this is a well appearing Caucasian male in no acute distress. He's alert and oriented x4 and appropriate throughout the examination. Cardiopulmonary assessment is negative for acute distress, and he exhibits normal effort.     KPS = 100  100 - Normal; no complaints; no evidence of disease. 90   - Able to carry on normal activity; minor signs or symptoms of disease. 80   - Normal activity with effort; some signs or symptoms of disease. 11   - Cares for self; unable to carry on normal activity or to do active work. 60   - Requires occasional assistance, but is able to care for most of his personal needs. 50   - Requires considerable assistance and frequent medical care. 40   - Disabled; requires special care and assistance. 30   - Severely disabled; hospital admission is indicated although death not imminent. 20   - Very sick; hospital admission necessary; active supportive treatment necessary. 10   - Moribund; fatal processes progressing rapidly. 0     - Dead  Karnofsky DA, Abelmann WH, Craver LS and Burchenal JH [phone]) The use of the nitrogen mustards in the palliative treatment of carcinoma:  with particular reference to bronchogenic carcinoma Cancer 1 634-56  LABORATORY DATA:  Lab Results  Component Value Date   WBC 5.9 11/04/2023   HGB 13.7 11/04/2023   HCT 40.4 11/04/2023   MCV 82.3 11/04/2023   PLT 202 11/04/2023   Lab Results  Component Value Date   NA 134 (L) 11/05/2023   K 4.2 11/05/2023   CL 99 11/05/2023   CO2 24 11/05/2023   Lab Results  Component Value Date   ALT 39 11/03/2023   AST 39 11/03/2023   ALKPHOS 53 11/03/2023   BILITOT 0.8 11/03/2023     RADIOGRAPHY: CUP PACEART REMOTE DEVICE CHECK Result Date: 05/14/2024 Pacemaker: Scheduled remote reviewed. Normal device function.  Presenting rhythm: AS/VP Next remote 91 days. ML, CVRS     IMPRESSION/PLAN: 1. 68 y.o. gentleman with Stage T1c adenocarcinoma of the prostate with Gleason Score of 4+3, and PSA of 7.09. We discussed the patient's workup and outlined the nature of prostate cancer in this setting. The patient's T stage, Gleason's score, and PSA put him into the unfavorable intermediate risk group.  We do recommend PSMA PET scan to complete his disease staging.  Pending there are no unexpected findings to suggest metastatic disease on PSMA PET imaging, he is eligible for a variety of potential treatment options including prostatectomy, brachytherapy or 5.5 weeks of external radiation +/- ST-ADT. We discussed the available radiation techniques, and focused on the details and logistics of delivery. The patient may not be an ideal candidate for brachytherapy with a prostate volume of 67 cc but we could consider downsizing the prostate with finasteride for 3 months if he prefers to be considered for brachytherapy. We discussed and outlined the risks, benefits, short and long-term effects associated with radiotherapy and compared and contrasted these with prostatectomy. We discussed the role of SpaceOAR gel in reducing the rectal toxicity associated with radiotherapy. We also detailed the  role of ADT in the  treatment of unfavorable intermediate risk prostate cancer and outlined the associated side effects that could be expected with this therapy.  He appears to have a good understanding of his disease and our treatment recommendations which are of curative intent.  He was encouraged to ask questions that were answered to his stated satisfaction.  At the conclusion of our conversation, the patient is in agreement to proceed with the PSMA PET imaging to complete his disease staging and pending  there are no unexpected findings to suggest metastatic disease, he would like to proceed with a 5-1/2-week course of daily external beam radiation without ADT.  We will share our discussion with Dr. Nieves and coordinate for the PSMA PET scan, first available and plan to call the patient with those results.  We enjoyed meeting him today and look forward to continuing to participate in his care.  He knows that he is welcome to call at anytime in the interim with any questions or concerns.  We personally spent 70 minutes in this encounter including chart review, reviewing radiological studies, meeting face-to-face with the patient, entering orders and completing documentation.    Sabra MICAEL Rusk, PA-C    Donnice Barge, MD  [facility]  Radiation Oncology Direct Dial: [phone]  Fax: [phone] [email or web address]  Skype  LinkedIn

## 2024-06-07 DIAGNOSIS — Z191 Hormone sensitive malignancy status: Secondary | ICD-10-CM | POA: Diagnosis not present

## 2024-06-07 DIAGNOSIS — C61 Malignant neoplasm of prostate: Secondary | ICD-10-CM | POA: Diagnosis not present

## 2024-06-08 ENCOUNTER — Other Ambulatory Visit (HOSPITAL_COMMUNITY): Payer: Self-pay

## 2024-06-08 DIAGNOSIS — C61 Malignant neoplasm of prostate: Secondary | ICD-10-CM | POA: Diagnosis not present

## 2024-06-08 DIAGNOSIS — N400 Enlarged prostate without lower urinary tract symptoms: Secondary | ICD-10-CM | POA: Diagnosis not present

## 2024-06-08 MED ORDER — METFORMIN HCL 500 MG PO TABS
500.0000 mg | ORAL_TABLET | Freq: Two times a day (BID) | ORAL | 0 refills | Status: DC
  Filled 2024-06-08: qty 180, 90d supply, fill #0

## 2024-06-09 ENCOUNTER — Ambulatory Visit (HOSPITAL_COMMUNITY)
Admission: RE | Admit: 2024-06-09 | Discharge: 2024-06-09 | Disposition: A | Discharge status: Home / Self Care | Source: Ambulatory Visit | Attending: Urology | Admitting: Urology

## 2024-06-09 DIAGNOSIS — C61 Malignant neoplasm of prostate: Secondary | ICD-10-CM | POA: Diagnosis not present

## 2024-06-09 DIAGNOSIS — I517 Cardiomegaly: Secondary | ICD-10-CM | POA: Diagnosis not present

## 2024-06-09 DIAGNOSIS — Z950 Presence of cardiac pacemaker: Secondary | ICD-10-CM | POA: Diagnosis not present

## 2024-06-09 DIAGNOSIS — K760 Fatty (change of) liver, not elsewhere classified: Secondary | ICD-10-CM | POA: Diagnosis not present

## 2024-06-09 MED ORDER — FLOTUFOLASTAT F 18 GALLIUM 296-5846 MBQ/ML IV SOLN
7.8600 | Freq: Once | INTRAVENOUS | Status: AC
  Administered 2024-06-09 (×2): 7.86 via INTRAVENOUS

## 2024-06-11 NOTE — Progress Notes (Signed)
 STAT read request placed for PSMA PET.

## 2024-06-12 ENCOUNTER — Other Ambulatory Visit (HOSPITAL_COMMUNITY): Payer: Self-pay

## 2024-06-15 NOTE — Progress Notes (Addendum)
 RN left message for call back to review PSMA PET results.  Patient returned call, updated on PSMA PET results. Agreeable to proceed with 5.5 weeks of IMRT.

## 2024-06-16 ENCOUNTER — Other Ambulatory Visit (HOSPITAL_COMMUNITY): Payer: Self-pay

## 2024-06-16 MED ORDER — OZEMPIC (2 MG/DOSE) 8 MG/3ML ~~LOC~~ SOPN
2.0000 mg | PEN_INJECTOR | SUBCUTANEOUS | 0 refills | Status: DC
  Filled 2024-06-16: qty 3, 28d supply, fill #0

## 2024-06-18 ENCOUNTER — Other Ambulatory Visit: Payer: Self-pay | Admitting: Urology

## 2024-06-18 DIAGNOSIS — C61 Malignant neoplasm of prostate: Secondary | ICD-10-CM

## 2024-06-26 NOTE — Progress Notes (Signed)
 RN spoke with patient reviewed next steps with fiducial's, spaceOAR, and CT Simulation.  All questions answered.  No additional needs at this time.

## 2024-06-30 ENCOUNTER — Emergency Department (HOSPITAL_BASED_OUTPATIENT_CLINIC_OR_DEPARTMENT_OTHER): Admitting: Radiology

## 2024-06-30 ENCOUNTER — Encounter (HOSPITAL_BASED_OUTPATIENT_CLINIC_OR_DEPARTMENT_OTHER): Payer: Self-pay

## 2024-06-30 ENCOUNTER — Other Ambulatory Visit: Payer: Self-pay

## 2024-06-30 ENCOUNTER — Emergency Department (HOSPITAL_BASED_OUTPATIENT_CLINIC_OR_DEPARTMENT_OTHER)
Admission: EM | Admit: 2024-06-30 | Discharge: 2024-06-30 | Disposition: A | Discharge status: Home / Self Care | Attending: Emergency Medicine | Admitting: Emergency Medicine

## 2024-06-30 DIAGNOSIS — M79602 Pain in left arm: Secondary | ICD-10-CM

## 2024-06-30 DIAGNOSIS — T81509A Unspecified complication of foreign body accidentally left in body following unspecified procedure, initial encounter: Secondary | ICD-10-CM | POA: Diagnosis not present

## 2024-06-30 DIAGNOSIS — M79622 Pain in left upper arm: Secondary | ICD-10-CM | POA: Insufficient documentation

## 2024-06-30 NOTE — ED Triage Notes (Signed)
 Patient reports changing his CGM and he believes that the old needle was left inside of his arm.

## 2024-06-30 NOTE — ED Provider Notes (Signed)
 [place] EMERGENCY DEPARTMENT AT [facility] Provider Note   CSN: [id number] Arrival date & time: 06/30/24  2108     Patient presents with: CGM Needle in Left Arm   Juan Hanson is a 68 y.o. male.   Patient presents to the emergency department for evaluation of possible retained foreign body in the left upper arm.  Patient has a history of diabetes.  He has a freestyle libre sensor that he put on his left upper arm.  He went to remove the sensor tonight at home.  He states that he pulled his straight out of the skin and dropped the sensor.  When he looked, he was unable to see the needle that comes out of it.  He looked around the bathroom on the floor and could not find it.  He states that it is approximately quarter of an inch long and is very small and gauge.  He has some mild localized irritation, no redness or swelling.  He was concerned about retained foreign body, prompting emergency department visit.  Denies any pain.       Prior to Admission medications   Medication Sig Start Date End Date Taking? Authorizing Provider  amLODipine  (NORVASC ) 10 MG tablet Take 1 tablet (10 mg total) by mouth daily. 05/12/24     Continuous Glucose Sensor (FREESTYLE LIBRE 3 PLUS SENSOR) MISC Use as directed. Change every 15 days. 03/16/24     losartan  (COZAAR ) 50 MG tablet Take 1 tablet (50 mg total) by mouth daily. 05/12/24     metFORMIN  (GLUCOPHAGE ) 500 MG tablet Take 1 tablet (500 mg total) by mouth 2 (two) times daily with a meal. 11/06/23   Ghimire, Donalda HERO, MD  metFORMIN  (GLUCOPHAGE ) 500 MG tablet Take 1 tablet (500 mg total) by mouth 2 (two) times daily with a meal 06/08/24     Semaglutide , 2 MG/DOSE, (OZEMPIC , 2 MG/DOSE,) 8 MG/3ML SOPN Inject 2 mg into the skin once a week. 06/16/24       Allergies: Patient has no known allergies.    Review of Systems  Updated Vital Signs BP 128/86 (BP Location: Right Arm)   Pulse 98   Temp 97.9 F (36.6 C)   Resp 17   Ht 6' 4 (1.93 m)    Wt (!) 167.8 kg   SpO2 98%   BMI 45.04 kg/m   Physical Exam Vitals and nursing note reviewed.  Constitutional:      Appearance: He is well-developed.  HENT:     Head: Normocephalic and atraumatic.  Eyes:     Conjunctiva/sclera: Conjunctivae normal.  Pulmonary:     Effort: No respiratory distress.  Musculoskeletal:     Cervical back: Normal range of motion and neck supple.     Comments: Small puncture noted to the posterior left upper arm where patient's sensor was in place.  I do not palpate any distinct foreign body that is under the skin.  No signs of infection.  Skin:    General: Skin is warm and dry.  Neurological:     Mental Status: He is alert.     (all labs ordered are listed, but only abnormal results are displayed) Labs Reviewed - No data to display  EKG: None  Radiology: DG Humerus Left Result Date: 06/30/2024 CLINICAL DATA:  Retained needle in upper arm. EXAM: LEFT HUMERUS - 2+ VIEW COMPARISON:  None Available. FINDINGS: There is no evidence of fracture or other focal bone lesions. Soft tissues are unremarkable. No retained foreign body  identified in the upper extremity. IMPRESSION: No retained foreign body identified in the upper extremity. Electronically Signed   By: Greig Pique M.D.   On: 06/30/2024 23:22     Procedures   Medications Ordered in the ED - No data to display  ED Course  Patient seen and examined. History obtained directly from patient.   Labs/EKG: None ordered  Imaging: Ordered x-ray left humerus to evaluate for retained foreign body.  Medications/Fluids: None ordered  Most recent vital signs reviewed and are as follows: BP 128/86 (BP Location: Right Arm)   Pulse 98   Temp 97.9 F (36.6 C)   Resp 17   Ht 6' 4 (1.93 m)   Wt (!) 167.8 kg   SpO2 98%   BMI 45.04 kg/m   Initial impression: Possible retained foreign body left upper arm.  We did discuss obtaining imaging versus just performing good wound care and monitoring for  signs of infection.  Patient agrees to proceed with imaging.  11:36 PM Reassessment performed. Patient appears stable, comfortable.  Imaging personally visualized and interpreted including: Agree no radiopaque foreign body noted in the soft tissues of the left upper arm.  Reviewed pertinent lab work and imaging with patient at bedside. Questions answered.   Most current vital signs reviewed and are as follows: BP 128/86 (BP Location: Right Arm)   Pulse 98   Temp 97.9 F (36.6 C)   Resp 17   Ht 6' 4 (1.93 m)   Wt (!) 167.8 kg   SpO2 98%   BMI 45.04 kg/m   Plan: Discharge to home.   Prescriptions written for: None  Other home care instructions discussed: Discussed localized wound care and monitoring  ED return instructions discussed: Pt urged to return with worsening pain, worsening swelling, expanding area of redness or streaking up extremity, fever, or any other concerns. Pt verbalizes understanding and agrees with plan.  Follow-up instructions discussed: Patient encouraged to follow-up with their PCP as needed.                                    Medical Decision Making Amount and/or Complexity of Data Reviewed Radiology: ordered.   Patient here with concern for retained glucose sensor in the left upper arm.  He could not find the small wire that comes out of the sensor after removal tonight.  He did drop the sensor.  It is possible that it became dislodged and he could just not find it.  No palpable foreign body.  No radiopaque foreign bodies on x-ray.  Patient will perform wound care and monitor for signs of infection.  No indication for I&D or other surgical procedure at this time.     Final diagnoses:  Pain of left upper extremity    ED Discharge Orders     None          Desiderio Chew, DEVONNA 06/30/24 2338    Doretha Folks, MD 07/02/24 2103

## 2024-06-30 NOTE — Discharge Instructions (Signed)
 Perform a good wound care on the area on the upper arm.  No definitive retained foreign body noted today on x-ray.  It is possible that the needle was dislodged when it fell.  If you do develop worsening pain, redness, swelling in the area, please follow-up for consideration of antibiotics or drainage.

## 2024-07-02 ENCOUNTER — Encounter: Payer: Self-pay | Admitting: Internal Medicine

## 2024-07-09 ENCOUNTER — Encounter: Payer: Self-pay | Admitting: Internal Medicine

## 2024-07-09 ENCOUNTER — Encounter (HOSPITAL_COMMUNITY): Payer: Self-pay | Admitting: Urology

## 2024-07-09 NOTE — Progress Notes (Signed)
 PERIOPERATIVE PRESCRIPTION FOR IMPLANTED CARDIAC DEVICE PROGRAMMING  Patient Information: Name:  Juan Hanson  DOB:  02/07/1956  MRN:  [id number]  Planned Procedure:  Insertion of Prostate Fiducial Markers and Space Oar  Surgeon:  Dr Steffan Pea  Date of Procedure:  07-16-2024  Cautery will be used.  Position during surgery:  Lithotomy  Device Information:  Clinic EP Physician:  Danelle Birmingham, MD   Device Type:  Pacemaker Manufacturer and Phone #:  Medtronic: [id number] Pacemaker Dependent?:  Yes.   Date of Last Device Check:  05/13/2024 Normal Device Function?:  Yes.    Electrophysiologist's Recommendations:  Have magnet available. Provide continuous ECG monitoring when magnet is used or reprogramming is to be performed.  Procedure should not interfere with device function.  No device programming or magnet placement needed.  Per Device Clinic Standing Orders, Juan DELENA Sharps, RN  4:26 PM 07/09/2024

## 2024-07-10 ENCOUNTER — Ambulatory Visit: Attending: Student | Admitting: Student

## 2024-07-10 ENCOUNTER — Encounter: Payer: Self-pay | Admitting: Student

## 2024-07-10 VITALS — BP 139/83 | HR 83 | Ht 76.0 in

## 2024-07-10 DIAGNOSIS — I442 Atrioventricular block, complete: Secondary | ICD-10-CM

## 2024-07-10 LAB — CUP PACEART INCLINIC DEVICE CHECK
Battery Remaining Longevity: 125 mo
Battery Voltage: 3.02 V
Brady Statistic AP VP Percent: 13.29 %
Brady Statistic AP VS Percent: 0 %
Brady Statistic AS VP Percent: 86.49 %
Brady Statistic AS VS Percent: 0.22 %
Brady Statistic RA Percent Paced: 13.34 %
Brady Statistic RV Percent Paced: 99.78 %
Date Time Interrogation Session: 20250912140004
Implantable Lead Connection Status: 753985
Implantable Lead Connection Status: 753985
Implantable Lead Implant Date: 20180220
Implantable Lead Implant Date: 20230418
Implantable Lead Location: 753859
Implantable Lead Location: 753860
Implantable Lead Model: 5076
Implantable Lead Model: 5076
Implantable Pulse Generator Implant Date: 20230418
Lead Channel Impedance Value: 323 Ohm
Lead Channel Impedance Value: 361 Ohm
Lead Channel Impedance Value: 418 Ohm
Lead Channel Impedance Value: 513 Ohm
Lead Channel Pacing Threshold Amplitude: 0.5 V
Lead Channel Pacing Threshold Amplitude: 0.75 V
Lead Channel Pacing Threshold Pulse Width: 0.4 ms
Lead Channel Pacing Threshold Pulse Width: 0.4 ms
Lead Channel Sensing Intrinsic Amplitude: 2.125 mV
Lead Channel Sensing Intrinsic Amplitude: 2.5 mV
Lead Channel Sensing Intrinsic Amplitude: 8 mV
Lead Channel Sensing Intrinsic Amplitude: 8.125 mV
Lead Channel Setting Pacing Amplitude: 1.5 V
Lead Channel Setting Pacing Amplitude: 2 V
Lead Channel Setting Pacing Pulse Width: 0.4 ms
Lead Channel Setting Sensing Sensitivity: 4 mV
Zone Setting Status: 755011
Zone Setting Status: 755011

## 2024-07-10 NOTE — Progress Notes (Signed)
  Electrophysiology Office Note:   ID:  Juan, Hanson 03-Feb-1956, MRN [id number]  Primary Cardiologist: None Electrophysiologist: Danelle Birmingham, MD      History of Present Illness:   Juan Hanson is a 68 y.o. male with h/o CHB s/p PPM, h/o device extraction from lead malfunction, and obesity seen today for routine electrophysiology followup.   Since last being seen in our clinic the patient reports doing well from a cardiac perspective. Overall, he denies chest pain, palpitations, dyspnea, PND, orthopnea, nausea, vomiting, dizziness, syncope, edema, weight gain, or early satiety.   He is pending surgery for Prostate CA with seeding.   Review of systems complete and found to be negative unless listed in HPI.   EP Information / Studies Reviewed:    EKG is ordered today. Personal review as below.  EKG Interpretation Date/Time:  Friday July 10 2024 11:20:14 EDT Ventricular Rate:  83 PR Interval:  174 QRS Duration:  188 QT Interval:  440 QTC Calculation: 517 R Axis:   -76  Text Interpretation: Atrial-sensed ventricular-paced rhythm When compared with ECG of 02-Nov-2023 00:14, PREVIOUS ECG IS PRESENT Confirmed by Lesia Sharper [phone]) on 07/10/2024 11:25:50 AM    PPM Interrogation-  reviewed in detail today,  See PACEART report.  Arrhythmia/Device History Medtronic Dual Chamber PPM 2018 -> exit block and extraction/re-implant 01/2022 for CHB   Physical Exam:   VS:  BP 139/83   Pulse 83   Ht 6' 4 (1.93 m)   SpO2 96%   BMI 45.04 kg/m    Wt Readings from Last 3 Encounters:  06/30/24 (!) 370 lb (167.8 kg)  06/02/24 (!) 371 lb 8 oz (168.5 kg)  11/05/23 (!) 347 lb 3.6 oz (157.5 kg)     GEN: No acute distress  NECK: No JVD; No carotid bruits CARDIAC: Regular rate and rhythm, no murmurs, rubs, gallops RESPIRATORY:  Clear to auscultation without rales, wheezing or rhonchi  ABDOMEN: Soft, non-tender, non-distended EXTREMITIES:  No edema; No deformity   ASSESSMENT  AND PLAN:    CHB s/p Medtronic PPM  Normal PPM function See Pace Art report No changes today  Obesity Body mass index is 45.04 kg/m.  Encouraged lifestyle modification  Cardiac Clearance for Prostate Surgery No concerns from a cardiac perspective.  The patient is at low risk to proceed without further work up.  If the patient has new chest pain or SOB prior to surgery, they should be revaluated.  Disposition:   Follow up with EP Team in 12 months  Signed, Sharper Prentice Lesia, PA-C

## 2024-07-10 NOTE — Patient Instructions (Signed)
 Medication Instructions:  Your physician recommends that you continue on your current medications as directed. Please refer to the Current Medication list given to you today.  *If you need a refill on your cardiac medications before your next appointment, please call your pharmacy*  Lab Work: None ordered If you have labs (blood work) drawn today and your tests are completely normal, you will receive your results only by: MyChart Message (if you have MyChart) OR A paper copy in the mail If you have any lab test that is abnormal or we need to change your treatment, we will call you to review the results.  Follow-Up: At [facility], you and your health needs are our priority.  As part of our continuing mission to provide you with exceptional heart care, our providers are all part of one team.  This team includes your primary Cardiologist (physician) and Advanced Practice Providers or APPs (Physician Assistants and Nurse Practitioners) who all work together to provide you with the care you need, when you need it.  Your next appointment:   1 year(s)  Provider:   Donnice Primus, MD

## 2024-07-14 ENCOUNTER — Encounter (HOSPITAL_COMMUNITY): Payer: Self-pay | Admitting: Urology

## 2024-07-14 NOTE — Progress Notes (Signed)
 Spoke w/ via phone for pre-op interview--- pt Lab needs dos----   bmp,  A1c      Lab results------  current EKG dated 11-02-2023 in epic/ chart COVID test -----patient states asymptomatic no test needed Arrive at ------- 0830 on 07-16-2024 NPO after MN w/ exception sips of water w/ meds Pre-Surgery Ensure or G2:  n/a  Med rec completed Medications to take morning of surgery ----- norvasc  Diabetic medication ----- do not take metformin  morning of surgery  GLP1 agonist last dose: 07-06-2024 GLP1 instructions: pt stated was given instructions by surgeon office  Patient instructed no nail polish to be worn day of surgery Patient instructed to bring photo id and insurance card day of surgery Patient aware to have Driver (ride ) / caregiver    for 24 hours after surgery - friend, eddie pugh Patient Special Instructions ----- will do one fleet enema night before surgery.  Antibacterial soap shower morning of surgery Pre-Op special Instructions ----- sent device orders in epic to device Heart Care clinic and received completed , copy in chart. Has Libre 3 Plus.  Currently on left arm.  Pt stated it will be time to change Wednesday night before surgery, he plans to just leave off and not put new on until after surgery.  Patient verbalized understanding of instructions that were given at this phone interview. Patient denies chest pain, sob, fever, cough at the interview.   Anesthesia Review:  HTN;  chronic diastolic CHF;  PPM for AV type 2 HB progressive to CHB 2018;   severe OSA , pt stated uses cpap nightly;  DM 2  PCP:  Dr JONETTA. Seabron (lov 02-11-2024, care everywhere) Cardiologist :  Dr KANDICE. Waddell [facility] 09-17-2025[facility]:  Dr CANDIE Acton  PPM/ ICD:  PPM  Device Orders:  completed 07-09-2024 in epic/ chart;    Rep Notified:    device remotely interrogated 05-18-2024  by Dr KANDICE. Waddell  (note in epic)  Chest x-ray :  11-02-2023 EKG :  11-02-2023 Echo :  11-04-2023 Stress test:   ETT 12-12-2016 Cardiac Cath :   no  Activity level:   pt stated no sob w/ stairs, long distance up to 1/2 mile, household  / yard chores Sleep Study/ CPAP :  yes/  pt stated uses cpap nightly Fasting Blood Sugar : 90--110     / Checks Blood Sugar -- times a day:  multiple times w/ CGM  Blood Thinner/ Instructions /Last Dose: no ASA / Instructions/ Last Dose :  no

## 2024-07-15 NOTE — H&P (Incomplete)
 H&P  Chief Complaint: Prostate cancer   History of Present Illness: 29 M w/ hx of GG3 disease 70 gram prostate. Pt has hx of pace maked and has clearance from cardiology here today for space OAR and fiducial marker placement.   Past Medical History:  Diagnosis Date   BPH (benign prostatic hyperplasia)    Chronic diastolic CHF (congestive heart failure) (HCC)    followd by dr g. taylor;   previous symptomatic AV 2:1 block s/p PPM 02/ 2018;  progressive to CHB, asymptomatic;   echo 11-04-2023  ef 60-65%   Complete heart block [facility])    EP cardiology--- Dr KANDICE. Waddell;   Edema of both lower extremities    07-14-2024  ankles   HTN (hypertension)    Malignant neoplasm prostate [facility]) 04/2024   urologist--- dr nieves;  radiation oncologist--- dr patrcia;  dx 07/ 2025,  Gleason 4+3/  PSA 7.09/  vol 67cc   OSA on CPAP    followed by Dr CANDIE Acton [facility]);   per note in care everywhere,  study done 04-05-2012  , AHI 71.4, severe OSA   Presence of permanent cardiac pacemaker 12/18/2016   for asymptomactic SB/ AV second degree;   02-13-2022  lead dysfunction,  new DDD PM system & one new lead.  Medtronic   Right bundle branch block    /notes 12/18/2016   Type 2 diabetes mellitus with hyperglycemia (HCC)    followed by pcp;  pt has LIbre 3 Plus CGM  (07-14-2024  pt stated checks blood sugar multiple times ,  fasting average   90--110)   Past Surgical History:  Procedure Laterality Date   COLONOSCOPY  2022   PACEMAKER IMPLANT N/A 12/18/2016   Procedure: Pacemaker Implant;  Surgeon: Danelle LELON Waddell, MD;  Location: [facility] INVASIVE CV LAB;  Service: Cardiovascular;  Laterality: N/A;   PACEMAKER LEAD REMOVAL N/A 02/13/2022   Procedure: PACEMAKER LEAD REMOVAL, PACEMAKER LEAD INCERTION, PACEMAKER GENERATOR CHANGE;  Surgeon: Waddell Danelle LELON, MD;  Location: [facility] OR;  Service: Cardiovascular;  Laterality: N/A;   SOFT TISSUE MASS EXCISION Left 03/03/2018   @[facility] by Dr CANDIE Blush;   of knee  (epidermal  inclusion cyst)    Home Medications:  No medications prior to admission.   Allergies: No Known Allergies  Family History  Problem Relation Age of Onset   Lung cancer Mother    Arthritis Mother    Colon cancer Father    Lung cancer Father    Healthy Brother        x3   Healthy Sister    Social History:  reports that he has never smoked. His smokeless tobacco use includes snuff. He reports that he does not currently use alcohol. He reports that he does not use drugs.  ROS: A complete review of systems was performed.  All systems are negative except for pertinent findings as noted. ROS   Physical Exam:  Vital signs in last 24 hours:   General:  Alert and oriented, No acute distress HEENT: Normocephalic, atraumatic Neck: No JVD or lymphadenopathy Cardiovascular: Regular rate and rhythm Lungs: Regular rate and effort Abdomen: Soft, nontender, nondistended, no abdominal masses Back: No CVA tenderness Extremities: No edema Neurologic: Grossly intact  Laboratory Data:  No results found for this or any previous visit (from the past 24 hours). No results found for this or any previous visit (from the past 240 hours). Creatinine: No results for input(s): CREATININE in the last 168 hours.  Impression/Assessment:  64  M w/ hx of GG3 disease 70 gram prostate. Pt has hx of pace maked and has clearance from cardiology here today for space OAR and fiducial marker placement.   We discussed risks benefits and alternatives to spaceOAR including infection, pain, bleeding, injury to the rectum and possible post rectal ulcer.  Cleared by cardiology - Ozell Passey.   Plan:  Proceed with space OAR placement   Steffan JAYSON Pea 07/15/2024, 5:47 PM

## 2024-07-16 ENCOUNTER — Other Ambulatory Visit (HOSPITAL_COMMUNITY): Payer: Self-pay

## 2024-07-16 ENCOUNTER — Encounter (HOSPITAL_COMMUNITY)
Admission: RE | Disposition: A | Discharge status: Home / Self Care | Payer: Self-pay | Source: Home / Self Care | Attending: Urology

## 2024-07-16 ENCOUNTER — Ambulatory Visit (HOSPITAL_COMMUNITY)

## 2024-07-16 ENCOUNTER — Ambulatory Visit (HOSPITAL_COMMUNITY)
Admission: RE | Admit: 2024-07-16 | Discharge: 2024-07-16 | Disposition: A | Discharge status: Home / Self Care | Attending: Urology | Admitting: Urology

## 2024-07-16 ENCOUNTER — Encounter (HOSPITAL_COMMUNITY): Payer: Self-pay | Admitting: Urology

## 2024-07-16 ENCOUNTER — Other Ambulatory Visit: Payer: Self-pay

## 2024-07-16 DIAGNOSIS — E119 Type 2 diabetes mellitus without complications: Secondary | ICD-10-CM | POA: Diagnosis not present

## 2024-07-16 DIAGNOSIS — E66813 Obesity, class 3: Secondary | ICD-10-CM | POA: Diagnosis not present

## 2024-07-16 DIAGNOSIS — I110 Hypertensive heart disease with heart failure: Secondary | ICD-10-CM | POA: Diagnosis not present

## 2024-07-16 DIAGNOSIS — G4733 Obstructive sleep apnea (adult) (pediatric): Secondary | ICD-10-CM | POA: Diagnosis not present

## 2024-07-16 DIAGNOSIS — I509 Heart failure, unspecified: Secondary | ICD-10-CM | POA: Diagnosis not present

## 2024-07-16 DIAGNOSIS — Z01818 Encounter for other preprocedural examination: Secondary | ICD-10-CM

## 2024-07-16 DIAGNOSIS — J9601 Acute respiratory failure with hypoxia: Secondary | ICD-10-CM | POA: Diagnosis not present

## 2024-07-16 DIAGNOSIS — Z8673 Personal history of transient ischemic attack (TIA), and cerebral infarction without residual deficits: Secondary | ICD-10-CM | POA: Insufficient documentation

## 2024-07-16 DIAGNOSIS — Z6841 Body Mass Index (BMI) 40.0 and over, adult: Secondary | ICD-10-CM | POA: Insufficient documentation

## 2024-07-16 DIAGNOSIS — Z950 Presence of cardiac pacemaker: Secondary | ICD-10-CM | POA: Insufficient documentation

## 2024-07-16 DIAGNOSIS — C61 Malignant neoplasm of prostate: Secondary | ICD-10-CM | POA: Diagnosis not present

## 2024-07-16 DIAGNOSIS — I5032 Chronic diastolic (congestive) heart failure: Secondary | ICD-10-CM | POA: Diagnosis not present

## 2024-07-16 HISTORY — DX: Localized edema: R60.0

## 2024-07-16 HISTORY — DX: Benign prostatic hyperplasia without lower urinary tract symptoms: N40.0

## 2024-07-16 HISTORY — PX: SPACE OAR INSTILLATION: SHX6769

## 2024-07-16 HISTORY — DX: Chronic diastolic (congestive) heart failure: I50.32

## 2024-07-16 HISTORY — DX: Type 2 diabetes mellitus with hyperglycemia: E11.65

## 2024-07-16 HISTORY — DX: Atrioventricular block, complete: I44.2

## 2024-07-16 HISTORY — PX: GOLD SEED IMPLANT: SHX6343

## 2024-07-16 LAB — BASIC METABOLIC PANEL WITH GFR
Anion gap: 12 (ref 5–15)
BUN: 15 mg/dL (ref 8–23)
CO2: 26 mmol/L (ref 22–32)
Calcium: 8.4 mg/dL — ABNORMAL LOW (ref 8.9–10.3)
Chloride: 99 mmol/L (ref 98–111)
Creatinine, Ser: 0.84 mg/dL (ref 0.61–1.24)
GFR, Estimated: >60 mL/min (ref >60)
Glucose, Bld: 152 mg/dL — ABNORMAL HIGH (ref 70–99)
Potassium: 3.3 mmol/L — ABNORMAL LOW (ref 3.5–5.1)
Sodium: 137 mmol/L (ref 135–145)

## 2024-07-16 LAB — HEMOGLOBIN A1C
Hgb A1c MFr Bld: 6.2 % — ABNORMAL HIGH (ref 4.8–5.6)
Mean Plasma Glucose: 131.24 mg/dL

## 2024-07-16 LAB — GLUCOSE, CAPILLARY
Glucose-Capillary: 138 mg/dL — ABNORMAL HIGH (ref 70–99)
Glucose-Capillary: 118 mg/dL — ABNORMAL HIGH (ref 70–99)

## 2024-07-16 SURGERY — INSERTION, GOLD SEEDS
Anesthesia: Monitor Anesthesia Care

## 2024-07-16 MED ORDER — PROPOFOL 10 MG/ML IV BOLUS
INTRAVENOUS | Status: AC
  Filled 2024-07-16: qty 20

## 2024-07-16 MED ORDER — ONDANSETRON HCL 4 MG/2ML IJ SOLN
INTRAMUSCULAR | Status: DC | PRN
Start: 2024-07-16 — End: 2024-07-16
  Administered 2024-07-16: 4 mg via INTRAVENOUS

## 2024-07-16 MED ORDER — FENTANYL CITRATE (PF) 100 MCG/2ML IJ SOLN: 25.0000 ug | INTRAMUSCULAR | Status: DC | PRN

## 2024-07-16 MED ORDER — MIDAZOLAM HCL 2 MG/2ML IJ SOLN
INTRAMUSCULAR | Status: AC
  Filled 2024-07-16: qty 2

## 2024-07-16 MED ORDER — FENTANYL CITRATE (PF) 250 MCG/5ML IJ SOLN
INTRAMUSCULAR | Status: AC
  Filled 2024-07-16: qty 5

## 2024-07-16 MED ORDER — SODIUM CHLORIDE (PF) 0.9 % IJ SOLN
INTRAMUSCULAR | Status: DC | PRN
  Administered 2024-07-16: 10 mL via INTRAVENOUS

## 2024-07-16 MED ORDER — LACTATED RINGERS IV SOLN: INTRAVENOUS | Status: DC

## 2024-07-16 MED ORDER — INSULIN ASPART 100 UNIT/ML IJ SOLN: 0.0000 [IU] | INTRAMUSCULAR | Status: DC | PRN

## 2024-07-16 MED ORDER — CHLORHEXIDINE GLUCONATE 0.12 % MT SOLN
15.0000 mL | Freq: Once | OROMUCOSAL | Status: AC
  Administered 2024-07-16: 15 mL via OROMUCOSAL

## 2024-07-16 MED ORDER — LIDOCAINE 2% (20 MG/ML) 5 ML SYRINGE
INTRAMUSCULAR | Status: DC | PRN
  Administered 2024-07-16: 60 mg via INTRAVENOUS

## 2024-07-16 MED ORDER — FLEET ENEMA RE ENEM
1.0000 | ENEMA | Freq: Once | RECTAL | Status: DC
  Filled 2024-07-16: qty 1

## 2024-07-16 MED ORDER — OXYCODONE HCL 5 MG PO TABS: 5.0000 mg | ORAL_TABLET | Freq: Once | ORAL | Status: DC | PRN

## 2024-07-16 MED ORDER — POLYETHYLENE GLYCOL 3350 17 GM/SCOOP PO POWD
17.0000 g | Freq: Every day | ORAL | 0 refills | Status: DC
  Filled 2024-07-16: qty 238, 14d supply, fill #0

## 2024-07-16 MED ORDER — MIDAZOLAM HCL 2 MG/2ML IJ SOLN
INTRAMUSCULAR | Status: DC | PRN
  Administered 2024-07-16: 2 mg via INTRAVENOUS

## 2024-07-16 MED ORDER — LIDOCAINE HCL (PF) 1 % IJ SOLN
INTRAMUSCULAR | Status: DC | PRN
  Administered 2024-07-16: 10 mL

## 2024-07-16 MED ORDER — CEFAZOLIN SODIUM 1 G IJ SOLR
INTRAMUSCULAR | Status: AC
  Filled 2024-07-16: qty 30

## 2024-07-16 MED ORDER — CHLORHEXIDINE GLUCONATE 0.12 % MT SOLN
OROMUCOSAL | Status: AC
  Filled 2024-07-16: qty 15

## 2024-07-16 MED ORDER — OXYCODONE HCL 5 MG PO TABS
5.0000 mg | ORAL_TABLET | Freq: Four times a day (QID) | ORAL | 0 refills | Status: AC | PRN
  Filled 2024-07-16: qty 3, 1d supply, fill #0

## 2024-07-16 MED ORDER — ORAL CARE MOUTH RINSE: 15.0000 mL | Freq: Once | OROMUCOSAL | Status: AC

## 2024-07-16 MED ORDER — ACETAMINOPHEN 10 MG/ML IV SOLN: 1000.0000 mg | Freq: Once | INTRAVENOUS | Status: DC | PRN

## 2024-07-16 MED ORDER — PROPOFOL 10 MG/ML IV BOLUS
INTRAVENOUS | Status: DC | PRN
  Administered 2024-07-16: 75 ug/kg/min via INTRAVENOUS
  Administered 2024-07-16: 200 mg via INTRAVENOUS

## 2024-07-16 MED ORDER — CEFAZOLIN SODIUM-DEXTROSE 3-4 GM/150ML-% IV SOLN
3.0000 g | INTRAVENOUS | Status: AC
  Administered 2024-07-16: 3 g via INTRAVENOUS

## 2024-07-16 MED ORDER — TAMSULOSIN HCL 0.4 MG PO CAPS
0.4000 mg | ORAL_CAPSULE | Freq: Every day | ORAL | 0 refills | Status: AC
  Filled 2024-07-16: qty 30, 30d supply, fill #0

## 2024-07-16 MED ORDER — OXYCODONE HCL 5 MG/5ML PO SOLN: 5.0000 mg | Freq: Once | ORAL | Status: DC | PRN

## 2024-07-16 SURGICAL SUPPLY — 23 items
BLADE CLIPPER SENSICLIP SURGIC (BLADE) ×1 IMPLANT
CNTNR URN SCR LID CUP LEK RST (MISCELLANEOUS) ×1 IMPLANT
COVER BACK TABLE 60X90IN (DRAPES) ×1 IMPLANT
DRSG TEGADERM 4X4.75 (GAUZE/BANDAGES/DRESSINGS) ×1 IMPLANT
DRSG TEGADERM 8X12 (GAUZE/BANDAGES/DRESSINGS) ×1 IMPLANT
GAUZE SPONGE 4X4 12PLY STRL (GAUZE/BANDAGES/DRESSINGS) ×1 IMPLANT
GAUZE SPONGE 4X4 12PLY STRL LF (GAUZE/BANDAGES/DRESSINGS) IMPLANT
GLOVE BIO SURGEON STRL SZ8 (GLOVE) ×1 IMPLANT
GOWN STRL SURGICAL XL XLNG (GOWN DISPOSABLE) ×1 IMPLANT
IMPL SPACEOAR SYSTEM 10ML (Spacer) ×1 IMPLANT
KIT TURNOVER KIT B (KITS) ×1 IMPLANT
MARKER GOLD PRELOAD 1.2X3 (Urological Implant) ×1 IMPLANT
MARKER SKIN DUAL TIP RULER LAB (MISCELLANEOUS) ×1 IMPLANT
NDL SPNL 22GX3.5 QUINCKE BK (NEEDLE) ×1 IMPLANT
NEEDLE SPNL 22GX3.5 QUINCKE BK (NEEDLE) ×1 IMPLANT
SHEATH ULTRASOUND LF (SHEATH) IMPLANT
SHEATH ULTRASOUND LTX NONSTRL (SHEATH) IMPLANT
SPIKE FLUID TRANSFER (MISCELLANEOUS) ×1 IMPLANT
SURGILUBE 2OZ TUBE FLIPTOP (MISCELLANEOUS) ×1 IMPLANT
SYR 10ML LL (SYRINGE) ×1 IMPLANT
SYR CONTROL 10ML LL (SYRINGE) ×1 IMPLANT
UNDERPAD 30X36 HEAVY ABSORB (UNDERPADS AND DIAPERS) ×1 IMPLANT
WATER STERILE IRR 500ML POUR (IV SOLUTION) IMPLANT

## 2024-07-16 NOTE — Anesthesia Postprocedure Evaluation (Signed)
 Anesthesia Post Note  Patient: Juan Hanson  Procedure(s) Performed: INSERTION, GOLD SEEDS INJECTION, HYDROGEL SPACER     Patient location during evaluation: PACU Anesthesia Type: MAC Level of consciousness: awake and alert Pain management: pain level controlled Vital Signs Assessment: post-procedure vital signs reviewed and stable Respiratory status: spontaneous breathing, nonlabored ventilation and respiratory function stable Cardiovascular status: stable and blood pressure returned to baseline Postop Assessment: no apparent nausea or vomiting Anesthetic complications: no   No notable events documented.  Last Vitals:  Vitals:   07/16/24 1130 07/16/24 1145  BP: 111/68 125/67  Pulse: 66 66  Resp: 17 (!) 24  Temp:  36.4 C  SpO2: 92% 95%    Last Pain:  Vitals:   07/16/24 1145  TempSrc:   PainSc: 0-No pain                 Merlen Gurry

## 2024-07-16 NOTE — Anesthesia Preprocedure Evaluation (Signed)
 Anesthesia Evaluation  Patient identified by MRN, date of birth, ID band Patient awake    Reviewed: Allergy & Precautions, NPO status , Patient's Chart, lab work & pertinent test results  History of Anesthesia Complications Negative for: history of anesthetic complications  Airway Mallampati: III  TM Distance: >3 FB Neck ROM: Full    Dental  (+) Dental Advisory Given   Pulmonary neg shortness of breath, sleep apnea and Continuous Positive Airway Pressure Ventilation , neg COPD, neg recent URI   breath sounds clear to auscultation       Cardiovascular hypertension, Pt. on medications (-) angina +CHF  (-) Past MI + dysrhythmias + pacemaker  Rhythm:Regular   1. Technically difficult study with reduced echo windows   2. Left ventricular ejection fraction, by estimation, is 60 to 65%. Left  ventricular ejection fraction by 2D MOD biplane is 61.1 %. The left  ventricle has normal function. The left ventricle has no regional wall  motion abnormalities. Left ventricular  diastolic parameters were normal.   3. Right ventricular systolic function is normal. The right ventricular  size is normal. Tricuspid regurgitation signal is inadequate for assessing  PA pressure.   4. The aortic valve is tricuspid. Aortic valve regurgitation is not  visualized.   5. The mitral valve is grossly normal. No evidence of mitral valve  regurgitation.     Neuro/Psych TIA negative psych ROS   GI/Hepatic negative GI ROS, Neg liver ROS,,,  Endo/Other  diabetes, Type 2  Class 3 obesity  Renal/GU negative Renal ROS     Musculoskeletal negative musculoskeletal ROS (+)    Abdominal   Peds  Hematology negative hematology ROS (+) Lab Results      Component                Value               Date                      WBC                      5.9                 11/04/2023                HGB                      13.7                11/04/2023                 HCT                      40.4                11/04/2023                MCV                      82.3                11/04/2023                PLT                      202  11/04/2023              Anesthesia Other Findings   Reproductive/Obstetrics                              Anesthesia Physical Anesthesia Plan  ASA: 3  Anesthesia Plan: MAC   Post-op Pain Management: Minimal or no pain anticipated   Induction: Intravenous  PONV Risk Score and Plan: 1 and Propofol  infusion and Treatment may vary due to age or medical condition  Airway Management Planned: Nasal Cannula, Natural Airway and Simple Face Mask  Additional Equipment: None  Intra-op Plan:   Post-operative Plan:   Informed Consent: I have reviewed the patients History and Physical, chart, labs and discussed the procedure including the risks, benefits and alternatives for the proposed anesthesia with the patient or authorized representative who has indicated his/her understanding and acceptance.     Dental advisory given  Plan Discussed with: CRNA  Anesthesia Plan Comments:          Anesthesia Quick Evaluation

## 2024-07-16 NOTE — Op Note (Signed)
 Operative Note  Preoperative diagnosis:  1.  Clinically localized adenocarcinoma of the prostate (GG3 disease)  Postoperative diagnosis: 1.  Clinically localized adenocarcinoma of the prostate  Procedure(s): 1. Placement of fiducial markers into prostate 2. Insertion of SpaceOAR hydrogel   Surgeon: Steffan Pea  Assistants:  None  Anesthesia:  General  Complications:  None  EBL:  Minimal  Specimens: 1. None  Drains/Catheters: 1.  None  Indication:  Juan Hanson is a 68 y.o. male with clinically localized prostate cancer. After discussing management options for treatment, he elected to proceed with radiotherapy. He presents today for the above procedures. The potential risks, complications, alternative options, and expected recovery course have been discussed in detail with the patient and he has provided informed consent to proceed.  Description of procedure: The patient was administered preoperative antibiotics, placed in the dorsal lithotomy position, and prepped and draped in the usual sterile fashion. Next, transrectal ultrasonography was utilized to visualize the prostate. Three gold fiducial markers were then placed into the prostate via transperineal needles under ultrasound guidance at the right apex, right base, and left mid gland under direct ultrasound guidance. A site in the midline was then selected on the perineum for placement of an 18 g needle with saline. The needle was advanced above the rectum and below Denonvillier's fascia to the mid gland and confirmed to be in the midline on transverse imaging. One cc of saline was injected confirming appropriate expansion of this space. A total of 5 cc of saline was then injected to open the space further bilaterally. The saline syringe was then removed and the SpaceOAR hydrogel was injected with good distribution bilaterally. He tolerated the procedure well and without complications. He was given a voiding trial prior to  discharge from the PACU.  Steffan Pea MD [facility]

## 2024-07-16 NOTE — Discharge Instructions (Addendum)
 For several days the patient:   should increase his fluid intake and limit strenuous activity for 1-2 weeks he might have mild discomfort at the base of his penis or in his rectum. he might have blood in his urine or blood in his bowel movements. Normal to experience the sensation of rectal fullness  Avoid sexual activity for 1 week  Bathing: Patient is ok to bath normally  Diet: Ok to resume normal diet Drink a lot of water    Medications: Avoid blood thinners for 48 hours after surgery (aspirin , ibuprofen, naproxen)   For 2-3 months he might have blood in his ejaculate (semen).   Call the office immedicately:  for blood clots in the urine or bowel movements,  difficulty urinating,  inability to urinate,  urinary retention,  painful or frequent urination,  fever, chills,  nausea, vomiting, other illness.   [facility]:  [phone]         Post Anesthesia Home Care Instructions  Activity: Get plenty of rest for the remainder of the day. A responsible individual must stay with you for 24 hours following the procedure.  For the next 24 hours, DO NOT: -Drive a car -Advertising copywriter -Drink alcoholic beverages -Take any medication unless instructed by your physician -Make any legal decisions or sign important papers.  Meals: Start with liquid foods such as gelatin or soup. Progress to regular foods as tolerated. Avoid greasy, spicy, heavy foods. If nausea and/or vomiting occur, drink only clear liquids until the nausea and/or vomiting subsides. Call your physician if vomiting continues.  Special Instructions/Symptoms: Your throat may feel dry or sore from the anesthesia or the breathing tube placed in your throat during surgery. If this causes discomfort, gargle with warm salt water . The discomfort should disappear within 24 hours.

## 2024-07-16 NOTE — Transfer of Care (Signed)
 Immediate Anesthesia Transfer of Care Note  Patient: Juan Hanson  Procedure(s) Performed: INSERTION, GOLD SEEDS INJECTION, HYDROGEL SPACER  Patient Location: PACU  Anesthesia Type:General  Level of Consciousness: awake, alert , and oriented  Airway & Oxygen Therapy: Patient Spontanous Breathing and Patient connected to face mask oxygen  Post-op Assessment: Report given to RN and Post -op Vital signs reviewed and stable  Post vital signs: Reviewed and stable  Last Vitals:  Vitals Value Taken Time  BP 111/68 07/16/24 11:05  Temp 36.5 C 07/16/24 11:05  Pulse 77 07/16/24 11:06  Resp 18 07/16/24 11:06  SpO2 93 % 07/16/24 11:06  Vitals shown include unfiled device data.  Last Pain:  Vitals:   07/16/24 0916  TempSrc: Oral  PainSc: 0-No pain      Patients Stated Pain Goal: 6 (07/16/24 0916)  Complications: No notable events documented.

## 2024-07-16 NOTE — Anesthesia Procedure Notes (Signed)
 Procedure Name: LMA Insertion Date/Time: 07/16/2024 10:42 AM  Performed by: Vence Lalor C, CRNAPre-anesthesia Checklist: Patient identified, Emergency Drugs available, Suction available and Patient being monitored Patient Re-evaluated:Patient Re-evaluated prior to induction Oxygen Delivery Method: Circle System Utilized Preoxygenation: Pre-oxygenation with 100% oxygen Induction Type: IV induction Ventilation: Mask ventilation without difficulty LMA: LMA inserted LMA Size: 4.0 Number of attempts: 1 Airway Equipment and Method: Bite block Placement Confirmation: positive ETCO2 Tube secured with: Tape Dental Injury: Teeth and Oropharynx as per pre-operative assessment

## 2024-07-17 ENCOUNTER — Encounter (HOSPITAL_COMMUNITY): Payer: Self-pay | Admitting: Urology

## 2024-07-17 ENCOUNTER — Telehealth: Payer: Self-pay | Admitting: *Deleted

## 2024-07-17 DIAGNOSIS — C61 Malignant neoplasm of prostate: Secondary | ICD-10-CM | POA: Diagnosis not present

## 2024-07-17 NOTE — Telephone Encounter (Signed)
 Called patient to remind of sim and MRI for 07-20-24, sim -patient to arrive @ 10:15 am @ [facility], patient to report with a full bladder, MRI- patient to arrive @ [facility] on 07-20-24- arrival time- 12 pm, lvm for a return call

## 2024-07-17 NOTE — CV Procedure (Signed)
  Device system confirmed to be MRI conditional, with implant date > 6 weeks ago, and no evidence of abandoned or epicardial leads in review of most recent CXR  Device last cleared by EP Provider: Prentice Passey 07/16/24  Clearance is good through for 1 year as long as parameters remain stable at time of check. If pt undergoes a cardiac device procedure during that time, they should be re-cleared.   Tachy-therapies to be programmed off if applicable with device back to pre-MRI settings after completion of exam.  [employer] - Programming recommendation received through [employer] App/Tablet  Rocky Catalan, RT  07/17/2024 9:36 AM

## 2024-07-20 ENCOUNTER — Ambulatory Visit (HOSPITAL_COMMUNITY)
Admission: RE | Admit: 2024-07-20 | Discharge: 2024-07-20 | Disposition: A | Discharge status: Home / Self Care | Source: Ambulatory Visit | Attending: Urology | Admitting: Urology

## 2024-07-20 ENCOUNTER — Encounter: Payer: Self-pay | Admitting: Internal Medicine

## 2024-07-20 ENCOUNTER — Ambulatory Visit
Admission: RE | Admit: 2024-07-20 | Discharge: 2024-07-20 | Disposition: A | Discharge status: Home / Self Care | Source: Ambulatory Visit | Attending: Radiation Oncology | Admitting: Radiation Oncology

## 2024-07-20 ENCOUNTER — Other Ambulatory Visit (HOSPITAL_COMMUNITY): Payer: Self-pay

## 2024-07-20 DIAGNOSIS — C61 Malignant neoplasm of prostate: Secondary | ICD-10-CM | POA: Insufficient documentation

## 2024-07-20 DIAGNOSIS — Z510 Encounter for antineoplastic radiation therapy: Secondary | ICD-10-CM | POA: Diagnosis present

## 2024-07-20 DIAGNOSIS — Z191 Hormone sensitive malignancy status: Secondary | ICD-10-CM | POA: Diagnosis not present

## 2024-07-20 MED ORDER — OZEMPIC (2 MG/DOSE) 8 MG/3ML ~~LOC~~ SOPN
2.0000 mg | PEN_INJECTOR | SUBCUTANEOUS | 0 refills | Status: DC
  Filled 2024-07-20: qty 3, 28d supply, fill #0

## 2024-07-20 NOTE — Progress Notes (Signed)
 Patient was monitored by this RN during MRI scan due to presence of a pacemaker. Cardiac rhythm was continuously monitored throughout the procedure. Prior to the start of the scan, the pacemaker was placed in MRI-safe mode by the MRI technician and/or pacemaker representative. Following the completion of the scan, the device was returned to its pre-MRI settings. Neurological status and orientation post-procedure were unchanged from baseline.   Pre-procedure Heart Rate (Prior to being placed in MRI safe mode): 80  MRI safe mode HR 100   Post-procedure Heart Rate (Once pacemaker is returned to baseline mode): 80

## 2024-07-20 NOTE — Progress Notes (Signed)
 TO BE COMPLETED BY RADIATION ONCOLOGIST OFFICE:   Patient Name: Juan Hanson   Date of Birth: 19-May-1956   Radiation Oncologist: Dr. Donnice Barge   Site to be Treated: Prostate  Will x-rays >10 MV be used? No  Will the radiation be >10 cm from the device? Yes  Planned Treatment Start Date: 07/23/2024  TO BE COMPLETED BY CARDIOLOGIST OFFICE:   Device Information:  Pacemaker [x]      ICD []    Brand: Medtronic: [phone]  Model #: Medtronic [id number] [address] MRI  Serial Number: [id number]     Date of Placement: 02/13/2022  Site of Placement: Chest  Remote Device Check--Frequency: Every 91 days  Last Check: 07/10/2024  Is the Patient Pacer Dependent?:  Yes [x]   No []   Does cardiologist request Radiation Oncology to schedule device testing by vendor for the following:  Prior to the Initiation of Treatments?  Yes []  No [x]  During Treatments?  Yes []  No [x]  Post Radiation Treatments?  Yes []  No [x]   Is device monitoring necessary by vendor/cardiologist team during treatments?  Yes []   No [x]   Is cardiac monitoring by Radiation Oncology nursing necessary during treatments? Yes []   No [x]   Do you recommend device be relocated prior to Radiation Treatment? Yes []   No [x]   **PLEASE LIST ANY NOTES OR SPECIAL REQUESTS:       CARDIOLOGIST SIGNATURE:  Dr. Danelle Birmingham Per Device Clinic Standing Orders, Juan Hanson  07/20/2024 11:25 AM  **Please route completed form back to Radiation Oncology Nursing and P [facility] RAD ONC ADMIN, OR send an update if there will be a delay in having form completed by expected start date.  **Call [phone] if you have any questions or do not get an in-basket response from a Radiation Oncology staff member

## 2024-07-21 ENCOUNTER — Ambulatory Visit: Attending: Cardiology

## 2024-07-21 ENCOUNTER — Telehealth: Payer: Self-pay | Admitting: Internal Medicine

## 2024-07-21 ENCOUNTER — Encounter: Payer: Self-pay | Admitting: Internal Medicine

## 2024-07-21 DIAGNOSIS — I442 Atrioventricular block, complete: Secondary | ICD-10-CM

## 2024-07-21 LAB — CUP PACEART INCLINIC DEVICE CHECK
Date Time Interrogation Session: 20250923153638
Implantable Lead Connection Status: 753985
Implantable Lead Connection Status: 753985
Implantable Lead Implant Date: 20180220
Implantable Lead Implant Date: 20230418
Implantable Lead Location: 753859
Implantable Lead Location: 753860
Implantable Lead Model: 5076
Implantable Lead Model: 5076
Implantable Pulse Generator Implant Date: 20230418

## 2024-07-21 NOTE — Telephone Encounter (Signed)
 Pt scheduled for device clinic f/u.

## 2024-07-21 NOTE — Telephone Encounter (Signed)
 Reviewed patient's transmission:  All parameters are set back exactly the same as from prior check with A. Tillery PA-C on 07/10/24. Do not suspect symptoms are related to MRI.  Hx: of 1 brief NSVT on 9/21 <20 beats.   However do note some dyssynchrony with ? competive AP possibly due to length of PVARP or other underlying issue throwing off timing cycle on presenting EGM. Review with [facility] rep: Donley.    Per Donley with MDT: Question PAC's, atrial driven that we will not want to track versus possible retrograde given grouped pattern or TWOS.     He recommends patient be brought in to evaluate:  Test Retrograde - VVI 90 during with high output to allow time to watch for conduction.  Consider if could be some TWOS with the AR beats.  Ensure proper device function  Ectopy may be throwing off timing cycle and definitely could account for irregularity of rates seen on pulse ox.  Will evaluate in device clinic further today at 2pm.

## 2024-07-21 NOTE — Patient Instructions (Signed)
Follow up per recall. 

## 2024-07-21 NOTE — Telephone Encounter (Addendum)
 Patient had an MRI yesterday.  LRL increased to 100 for procedure. Said they reset it back.   However, this am, got lightheaded, dizzy mowing the yard this morning.  O2 sat is normal. HR was: 47-64 for the past 2 hours. His HR is never that low.  BP is normal. Having patient send a transmission.   PER PATIENT: (Pulled from Mychart message):    Yesterday I had a MR done on my prostate and they changed my pacing to 100 bpm for the procedure. I was told by the nurses that they had reset my pacing following the procedure. I got a little light headed this morning so I checked my bpm with blood pressure cuff and oximeter. The cuff had my bpm at 55. My oximeter has been ranging between 47 bpm to 64 bpm throughout the morning. Blood pressure has been normal 115 to 125/70 to 80. My Oxygen levels have been normal, 95 -97%. Please advise on course of action.

## 2024-07-21 NOTE — Telephone Encounter (Signed)
 Patient states he has procedure yesterday and dont believe his setting was set back correctly. Please advise

## 2024-07-21 NOTE — Telephone Encounter (Signed)
 Please refer to patient's phone message he sent also addressing for full details.

## 2024-07-21 NOTE — Progress Notes (Signed)
 Pt seen in device clinic d/t lightheadedness with activity and HR's < 60 bpm per pulse ox.  Manual transmission received and reviewed.  Pt with atrial signal falling into PVARP.  PVARP on auto and measures at 360-380 ms.  Pt tested for VA conduction.  None noted.  Discussed with Dr. Kennyth.  Discontinue AUTO PVARP.  Program to 250 ms.   Pt will continue to monitor and contact Device clinic if further symptoms noted.

## 2024-07-22 ENCOUNTER — Other Ambulatory Visit (HOSPITAL_COMMUNITY): Payer: Self-pay

## 2024-07-22 NOTE — Progress Notes (Signed)
  Radiation Oncology         (336) [phone] ________________________________  Name: Juan Hanson MRN: [id number]  Date: 07/20/2024  DOB: 1956-09-29  SIMULATION AND TREATMENT PLANNING NOTE    ICD-10-CM   1. Malignant neoplasm of prostate (HCC)  C61       DIAGNOSIS:  68 y.o. gentleman with Stage T1c adenocarcinoma of the prostate with Gleason score of 4+3, and PSA of 7.09.  NARRATIVE:  The patient was brought to the CT Simulation planning suite.  Identity was confirmed.  All relevant records and images related to the planned course of therapy were reviewed.  The patient freely provided informed written consent to proceed with treatment after reviewing the details related to the planned course of therapy. The consent form was witnessed and verified by the simulation staff.  Then, the patient was set-up in a stable reproducible supine position for radiation therapy.  A vacuum lock pillow device was custom fabricated to position his legs in a reproducible immobilized position.  Then, supervised the performance of a urethrogram under sterile conditions to identify the prostatic apex.  CT images were obtained.  Surface markings were placed.  The CT images were loaded into the planning software.  Then the prostate target and avoidance structures including the rectum, bladder, bowel and hips were contoured.  Treatment planning then occurred.  The radiation prescription was entered and confirmed.  A total of one complex treatment devices was fabricated. I have requested : Intensity Modulated Radiotherapy (IMRT) is medically necessary for this case for the following reason:  Rectal sparing.  I have requested daily cone beam CT volumetric image gudiance to track gold fiducial posiitoning along with bladder and rectal filling, this is medically necessary to assure accurate positioning of high dose radiation.  PLAN:  The patient will receive 70 Gy in 28 fractions.  ________________________________  Donnice FELIX  Patrcia, M.D.

## 2024-07-23 DIAGNOSIS — Z510 Encounter for antineoplastic radiation therapy: Secondary | ICD-10-CM | POA: Diagnosis not present

## 2024-07-30 ENCOUNTER — Ambulatory Visit
Admission: RE | Admit: 2024-07-30 | Discharge: 2024-07-30 | Disposition: A | Discharge status: Home / Self Care | Source: Ambulatory Visit | Attending: Radiation Oncology | Admitting: Radiation Oncology

## 2024-07-30 ENCOUNTER — Other Ambulatory Visit: Payer: Self-pay

## 2024-07-30 DIAGNOSIS — Z510 Encounter for antineoplastic radiation therapy: Secondary | ICD-10-CM | POA: Insufficient documentation

## 2024-07-30 DIAGNOSIS — C61 Malignant neoplasm of prostate: Secondary | ICD-10-CM | POA: Diagnosis not present

## 2024-07-30 DIAGNOSIS — Z191 Hormone sensitive malignancy status: Secondary | ICD-10-CM | POA: Diagnosis not present

## 2024-07-30 LAB — RAD ONC ARIA SESSION SUMMARY
Course Elapsed Days: 0
Plan Fractions Treated to Date: 1
Plan Prescribed Dose Per Fraction: 2.5 Gy
Plan Total Fractions Prescribed: 28
Plan Total Prescribed Dose: 70 Gy
Reference Point Dosage Given to Date: 2.5 Gy
Reference Point Session Dosage Given: 2.5 Gy
Session Number: 1

## 2024-07-31 ENCOUNTER — Other Ambulatory Visit: Payer: Self-pay

## 2024-07-31 ENCOUNTER — Ambulatory Visit
Admission: RE | Admit: 2024-07-31 | Discharge: 2024-07-31 | Disposition: A | Discharge status: Home / Self Care | Source: Ambulatory Visit | Attending: Radiation Oncology | Admitting: Radiation Oncology

## 2024-07-31 DIAGNOSIS — Z191 Hormone sensitive malignancy status: Secondary | ICD-10-CM | POA: Diagnosis not present

## 2024-07-31 DIAGNOSIS — Z510 Encounter for antineoplastic radiation therapy: Secondary | ICD-10-CM | POA: Diagnosis not present

## 2024-07-31 DIAGNOSIS — C61 Malignant neoplasm of prostate: Secondary | ICD-10-CM | POA: Diagnosis not present

## 2024-07-31 LAB — RAD ONC ARIA SESSION SUMMARY
Course Elapsed Days: 1
Plan Fractions Treated to Date: 2
Plan Prescribed Dose Per Fraction: 2.5 Gy
Plan Total Fractions Prescribed: 28
Plan Total Prescribed Dose: 70 Gy
Reference Point Dosage Given to Date: 5 Gy
Reference Point Session Dosage Given: 2.5 Gy
Session Number: 2

## 2024-08-03 ENCOUNTER — Ambulatory Visit
Admission: RE | Admit: 2024-08-03 | Discharge: 2024-08-03 | Disposition: A | Discharge status: Home / Self Care | Source: Ambulatory Visit | Attending: Radiation Oncology | Admitting: Radiation Oncology

## 2024-08-03 ENCOUNTER — Other Ambulatory Visit: Payer: Self-pay

## 2024-08-03 DIAGNOSIS — C61 Malignant neoplasm of prostate: Secondary | ICD-10-CM | POA: Diagnosis not present

## 2024-08-03 DIAGNOSIS — Z510 Encounter for antineoplastic radiation therapy: Secondary | ICD-10-CM | POA: Diagnosis not present

## 2024-08-03 DIAGNOSIS — Z191 Hormone sensitive malignancy status: Secondary | ICD-10-CM | POA: Diagnosis not present

## 2024-08-03 LAB — RAD ONC ARIA SESSION SUMMARY
Course Elapsed Days: 4
Plan Fractions Treated to Date: 3
Plan Prescribed Dose Per Fraction: 2.5 Gy
Plan Total Fractions Prescribed: 28
Plan Total Prescribed Dose: 70 Gy
Reference Point Dosage Given to Date: 7.5 Gy
Reference Point Session Dosage Given: 2.5 Gy
Session Number: 3

## 2024-08-04 ENCOUNTER — Other Ambulatory Visit: Payer: Self-pay

## 2024-08-04 ENCOUNTER — Ambulatory Visit
Admission: RE | Admit: 2024-08-04 | Discharge: 2024-08-04 | Disposition: A | Discharge status: Home / Self Care | Source: Ambulatory Visit | Attending: Radiation Oncology | Admitting: Radiation Oncology

## 2024-08-04 DIAGNOSIS — Z510 Encounter for antineoplastic radiation therapy: Secondary | ICD-10-CM | POA: Diagnosis not present

## 2024-08-04 LAB — RAD ONC ARIA SESSION SUMMARY
Course Elapsed Days: 5
Plan Fractions Treated to Date: 4
Plan Prescribed Dose Per Fraction: 2.5 Gy
Plan Total Fractions Prescribed: 28
Plan Total Prescribed Dose: 70 Gy
Reference Point Dosage Given to Date: 10 Gy
Reference Point Session Dosage Given: 2.5 Gy
Session Number: 4

## 2024-08-04 NOTE — Progress Notes (Signed)
 Remote PPM Transmission

## 2024-08-05 ENCOUNTER — Ambulatory Visit
Admission: RE | Admit: 2024-08-05 | Discharge: 2024-08-05 | Disposition: A | Discharge status: Home / Self Care | Source: Ambulatory Visit | Attending: Radiation Oncology | Admitting: Radiation Oncology

## 2024-08-05 ENCOUNTER — Other Ambulatory Visit: Payer: Self-pay

## 2024-08-05 DIAGNOSIS — C61 Malignant neoplasm of prostate: Secondary | ICD-10-CM | POA: Diagnosis not present

## 2024-08-05 DIAGNOSIS — Z23 Encounter for immunization: Secondary | ICD-10-CM | POA: Diagnosis not present

## 2024-08-05 DIAGNOSIS — Z510 Encounter for antineoplastic radiation therapy: Secondary | ICD-10-CM | POA: Diagnosis not present

## 2024-08-05 DIAGNOSIS — Z191 Hormone sensitive malignancy status: Secondary | ICD-10-CM | POA: Diagnosis not present

## 2024-08-05 LAB — RAD ONC ARIA SESSION SUMMARY
Course Elapsed Days: 6
Plan Fractions Treated to Date: 5
Plan Prescribed Dose Per Fraction: 2.5 Gy
Plan Total Fractions Prescribed: 28
Plan Total Prescribed Dose: 70 Gy
Reference Point Dosage Given to Date: 12.5 Gy
Reference Point Session Dosage Given: 2.5 Gy
Session Number: 5

## 2024-08-06 ENCOUNTER — Other Ambulatory Visit: Payer: Self-pay

## 2024-08-06 ENCOUNTER — Ambulatory Visit
Admission: RE | Admit: 2024-08-06 | Discharge: 2024-08-06 | Disposition: A | Source: Ambulatory Visit | Attending: Radiation Oncology

## 2024-08-06 ENCOUNTER — Other Ambulatory Visit (HOSPITAL_COMMUNITY): Payer: Self-pay

## 2024-08-06 ENCOUNTER — Ambulatory Visit: Admission: RE | Admit: 2024-08-06 | Discharge: 2024-08-06 | Attending: Radiation Oncology

## 2024-08-06 DIAGNOSIS — C61 Malignant neoplasm of prostate: Secondary | ICD-10-CM | POA: Diagnosis not present

## 2024-08-06 DIAGNOSIS — Z191 Hormone sensitive malignancy status: Secondary | ICD-10-CM | POA: Diagnosis not present

## 2024-08-06 DIAGNOSIS — Z510 Encounter for antineoplastic radiation therapy: Secondary | ICD-10-CM | POA: Diagnosis not present

## 2024-08-06 LAB — RAD ONC ARIA SESSION SUMMARY
Course Elapsed Days: 7
Plan Fractions Treated to Date: 6
Plan Prescribed Dose Per Fraction: 2.5 Gy
Plan Total Fractions Prescribed: 28
Plan Total Prescribed Dose: 70 Gy
Reference Point Dosage Given to Date: 15 Gy
Reference Point Session Dosage Given: 2.5 Gy
Session Number: 6

## 2024-08-07 ENCOUNTER — Other Ambulatory Visit (HOSPITAL_COMMUNITY): Payer: Self-pay

## 2024-08-07 ENCOUNTER — Other Ambulatory Visit: Payer: Self-pay

## 2024-08-07 ENCOUNTER — Ambulatory Visit

## 2024-08-07 ENCOUNTER — Ambulatory Visit
Admission: RE | Admit: 2024-08-07 | Discharge: 2024-08-07 | Disposition: A | Discharge status: Home / Self Care | Source: Ambulatory Visit | Attending: Radiation Oncology | Admitting: Radiation Oncology

## 2024-08-07 DIAGNOSIS — Z510 Encounter for antineoplastic radiation therapy: Secondary | ICD-10-CM | POA: Diagnosis not present

## 2024-08-07 LAB — RAD ONC ARIA SESSION SUMMARY
Course Elapsed Days: 8
Plan Fractions Treated to Date: 7
Plan Prescribed Dose Per Fraction: 2.5 Gy
Plan Total Fractions Prescribed: 28
Plan Total Prescribed Dose: 70 Gy
Reference Point Dosage Given to Date: 17.5 Gy
Reference Point Session Dosage Given: 2.5 Gy
Session Number: 7

## 2024-08-08 ENCOUNTER — Other Ambulatory Visit (HOSPITAL_COMMUNITY): Payer: Self-pay

## 2024-08-08 MED ORDER — OZEMPIC (2 MG/DOSE) 8 MG/3ML ~~LOC~~ SOPN
2.0000 mg | PEN_INJECTOR | SUBCUTANEOUS | 0 refills | Status: DC
  Filled 2024-08-08 – 2024-08-10 (×2): qty 3, 28d supply, fill #0

## 2024-08-10 ENCOUNTER — Other Ambulatory Visit (HOSPITAL_COMMUNITY): Payer: Self-pay

## 2024-08-10 ENCOUNTER — Ambulatory Visit
Admission: RE | Admit: 2024-08-10 | Discharge: 2024-08-10 | Disposition: A | Source: Ambulatory Visit | Attending: Radiation Oncology

## 2024-08-10 ENCOUNTER — Other Ambulatory Visit: Payer: Self-pay

## 2024-08-10 DIAGNOSIS — Z510 Encounter for antineoplastic radiation therapy: Secondary | ICD-10-CM | POA: Diagnosis not present

## 2024-08-10 LAB — RAD ONC ARIA SESSION SUMMARY
Course Elapsed Days: 11
Plan Fractions Treated to Date: 8
Plan Prescribed Dose Per Fraction: 2.5 Gy
Plan Total Fractions Prescribed: 28
Plan Total Prescribed Dose: 70 Gy
Reference Point Dosage Given to Date: 20 Gy
Reference Point Session Dosage Given: 2.5 Gy
Session Number: 8

## 2024-08-11 ENCOUNTER — Ambulatory Visit
Admission: RE | Admit: 2024-08-11 | Discharge: 2024-08-11 | Disposition: A | Source: Ambulatory Visit | Attending: Radiation Oncology

## 2024-08-11 ENCOUNTER — Other Ambulatory Visit: Payer: Self-pay

## 2024-08-11 DIAGNOSIS — C61 Malignant neoplasm of prostate: Secondary | ICD-10-CM | POA: Diagnosis not present

## 2024-08-11 DIAGNOSIS — Z510 Encounter for antineoplastic radiation therapy: Secondary | ICD-10-CM | POA: Diagnosis not present

## 2024-08-11 DIAGNOSIS — Z191 Hormone sensitive malignancy status: Secondary | ICD-10-CM | POA: Diagnosis not present

## 2024-08-11 LAB — RAD ONC ARIA SESSION SUMMARY
Course Elapsed Days: 12
Plan Fractions Treated to Date: 9
Plan Prescribed Dose Per Fraction: 2.5 Gy
Plan Total Fractions Prescribed: 28
Plan Total Prescribed Dose: 70 Gy
Reference Point Dosage Given to Date: 22.5 Gy
Reference Point Session Dosage Given: 2.5 Gy
Session Number: 9

## 2024-08-12 ENCOUNTER — Ambulatory Visit
Admission: RE | Admit: 2024-08-12 | Discharge: 2024-08-12 | Disposition: A | Source: Ambulatory Visit | Attending: Radiation Oncology

## 2024-08-12 ENCOUNTER — Other Ambulatory Visit: Payer: Self-pay

## 2024-08-12 DIAGNOSIS — Z191 Hormone sensitive malignancy status: Secondary | ICD-10-CM | POA: Diagnosis not present

## 2024-08-12 DIAGNOSIS — Z510 Encounter for antineoplastic radiation therapy: Secondary | ICD-10-CM | POA: Diagnosis not present

## 2024-08-12 DIAGNOSIS — C61 Malignant neoplasm of prostate: Secondary | ICD-10-CM | POA: Diagnosis not present

## 2024-08-12 LAB — RAD ONC ARIA SESSION SUMMARY
Course Elapsed Days: 13
Plan Fractions Treated to Date: 10
Plan Prescribed Dose Per Fraction: 2.5 Gy
Plan Total Fractions Prescribed: 28
Plan Total Prescribed Dose: 70 Gy
Reference Point Dosage Given to Date: 25 Gy
Reference Point Session Dosage Given: 2.5 Gy
Session Number: 10

## 2024-08-13 ENCOUNTER — Other Ambulatory Visit: Payer: Self-pay

## 2024-08-13 ENCOUNTER — Other Ambulatory Visit (HOSPITAL_COMMUNITY): Payer: Self-pay

## 2024-08-13 ENCOUNTER — Ambulatory Visit (INDEPENDENT_AMBULATORY_CARE_PROVIDER_SITE_OTHER): Payer: Medicare Other

## 2024-08-13 ENCOUNTER — Ambulatory Visit
Admission: RE | Admit: 2024-08-13 | Discharge: 2024-08-13 | Disposition: A | Discharge status: Home / Self Care | Source: Ambulatory Visit | Attending: Radiation Oncology | Admitting: Radiation Oncology

## 2024-08-13 DIAGNOSIS — C61 Malignant neoplasm of prostate: Secondary | ICD-10-CM | POA: Diagnosis not present

## 2024-08-13 DIAGNOSIS — I441 Atrioventricular block, second degree: Secondary | ICD-10-CM

## 2024-08-13 DIAGNOSIS — I10 Essential (primary) hypertension: Secondary | ICD-10-CM | POA: Diagnosis not present

## 2024-08-13 DIAGNOSIS — Z191 Hormone sensitive malignancy status: Secondary | ICD-10-CM | POA: Diagnosis not present

## 2024-08-13 DIAGNOSIS — E1165 Type 2 diabetes mellitus with hyperglycemia: Secondary | ICD-10-CM | POA: Diagnosis not present

## 2024-08-13 DIAGNOSIS — Z510 Encounter for antineoplastic radiation therapy: Secondary | ICD-10-CM | POA: Diagnosis not present

## 2024-08-13 DIAGNOSIS — R609 Edema, unspecified: Secondary | ICD-10-CM | POA: Diagnosis not present

## 2024-08-13 DIAGNOSIS — G4733 Obstructive sleep apnea (adult) (pediatric): Secondary | ICD-10-CM | POA: Diagnosis not present

## 2024-08-13 LAB — RAD ONC ARIA SESSION SUMMARY
Course Elapsed Days: 14
Plan Fractions Treated to Date: 11
Plan Prescribed Dose Per Fraction: 2.5 Gy
Plan Total Fractions Prescribed: 28
Plan Total Prescribed Dose: 70 Gy
Reference Point Dosage Given to Date: 27.5 Gy
Reference Point Session Dosage Given: 2.5 Gy
Session Number: 11

## 2024-08-13 MED ORDER — LOSARTAN POTASSIUM 100 MG PO TABS
100.0000 mg | ORAL_TABLET | Freq: Every day | ORAL | 1 refills | Status: AC
  Filled 2024-08-13: qty 90, 90d supply, fill #0
  Filled 2024-11-06: qty 90, 90d supply, fill #1

## 2024-08-13 MED ORDER — OZEMPIC (2 MG/DOSE) 8 MG/3ML ~~LOC~~ SOPN
2.0000 mg | PEN_INJECTOR | SUBCUTANEOUS | 5 refills | Status: AC
  Filled 2024-08-13 – 2024-09-11 (×2): qty 3, 28d supply, fill #0
  Filled 2024-10-09: qty 3, 28d supply, fill #1
  Filled 2024-11-06 – 2024-11-07 (×2): qty 3, 28d supply, fill #2

## 2024-08-13 MED ORDER — AMLODIPINE BESYLATE 10 MG PO TABS
10.0000 mg | ORAL_TABLET | Freq: Every day | ORAL | 1 refills | Status: AC
  Filled 2024-08-13: qty 90, 90d supply, fill #0
  Filled 2024-11-06: qty 90, 90d supply, fill #1

## 2024-08-13 MED ORDER — METFORMIN HCL 500 MG PO TABS
500.0000 mg | ORAL_TABLET | Freq: Two times a day (BID) | ORAL | 1 refills | Status: AC
  Filled 2024-08-13: qty 180, 90d supply, fill #0
  Filled 2024-11-06: qty 180, 90d supply, fill #1

## 2024-08-14 ENCOUNTER — Ambulatory Visit
Admission: RE | Admit: 2024-08-14 | Discharge: 2024-08-14 | Disposition: A | Discharge status: Home / Self Care | Source: Ambulatory Visit | Attending: Radiation Oncology | Admitting: Radiation Oncology

## 2024-08-14 ENCOUNTER — Other Ambulatory Visit: Payer: Self-pay

## 2024-08-14 ENCOUNTER — Ambulatory Visit
Admission: RE | Admit: 2024-08-14 | Discharge: 2024-08-14 | Disposition: A | Source: Ambulatory Visit | Attending: Radiation Oncology

## 2024-08-14 ENCOUNTER — Other Ambulatory Visit (HOSPITAL_COMMUNITY): Payer: Self-pay

## 2024-08-14 DIAGNOSIS — C61 Malignant neoplasm of prostate: Secondary | ICD-10-CM | POA: Diagnosis not present

## 2024-08-14 DIAGNOSIS — Z510 Encounter for antineoplastic radiation therapy: Secondary | ICD-10-CM | POA: Diagnosis not present

## 2024-08-14 DIAGNOSIS — Z191 Hormone sensitive malignancy status: Secondary | ICD-10-CM | POA: Diagnosis not present

## 2024-08-14 LAB — RAD ONC ARIA SESSION SUMMARY
Course Elapsed Days: 15
Plan Fractions Treated to Date: 12
Plan Prescribed Dose Per Fraction: 2.5 Gy
Plan Total Fractions Prescribed: 28
Plan Total Prescribed Dose: 70 Gy
Reference Point Dosage Given to Date: 30 Gy
Reference Point Session Dosage Given: 2.5 Gy
Session Number: 12

## 2024-08-14 LAB — CUP PACEART REMOTE DEVICE CHECK
Battery Remaining Longevity: 122 mo
Battery Voltage: 3.02 V
Brady Statistic AP VP Percent: 1.64 %
Brady Statistic AP VS Percent: 0 %
Brady Statistic AS VP Percent: 98.17 %
Brady Statistic AS VS Percent: 0.19 %
Brady Statistic RA Percent Paced: 1.66 %
Brady Statistic RV Percent Paced: 99.81 %
Date Time Interrogation Session: 20251015191620
Implantable Lead Connection Status: 753985
Implantable Lead Connection Status: 753985
Implantable Lead Implant Date: 20180220
Implantable Lead Implant Date: 20230418
Implantable Lead Location: 753859
Implantable Lead Location: 753860
Implantable Lead Model: 5076
Implantable Lead Model: 5076
Implantable Pulse Generator Implant Date: 20230418
Lead Channel Impedance Value: 342 Ohm
Lead Channel Impedance Value: 361 Ohm
Lead Channel Impedance Value: 399 Ohm
Lead Channel Impedance Value: 475 Ohm
Lead Channel Pacing Threshold Amplitude: 0.5 V
Lead Channel Pacing Threshold Amplitude: 0.625 V
Lead Channel Pacing Threshold Pulse Width: 0.4 ms
Lead Channel Pacing Threshold Pulse Width: 0.4 ms
Lead Channel Sensing Intrinsic Amplitude: 3.125 mV
Lead Channel Sensing Intrinsic Amplitude: 3.125 mV
Lead Channel Sensing Intrinsic Amplitude: 8 mV
Lead Channel Sensing Intrinsic Amplitude: 8.125 mV
Lead Channel Setting Pacing Amplitude: 1.5 V
Lead Channel Setting Pacing Amplitude: 2 V
Lead Channel Setting Pacing Pulse Width: 0.4 ms
Lead Channel Setting Sensing Sensitivity: 4 mV
Zone Setting Status: 755011
Zone Setting Status: 755011

## 2024-08-14 MED ORDER — ROSUVASTATIN CALCIUM 5 MG PO TABS
5.0000 mg | ORAL_TABLET | Freq: Every day | ORAL | 2 refills | Status: AC
  Filled 2024-08-14: qty 90, 90d supply, fill #0
  Filled 2024-11-06: qty 90, 90d supply, fill #1

## 2024-08-16 ENCOUNTER — Ambulatory Visit: Payer: Self-pay | Admitting: Internal Medicine

## 2024-08-17 ENCOUNTER — Other Ambulatory Visit (HOSPITAL_COMMUNITY): Payer: Self-pay

## 2024-08-17 ENCOUNTER — Other Ambulatory Visit: Payer: Self-pay

## 2024-08-17 ENCOUNTER — Ambulatory Visit
Admission: RE | Admit: 2024-08-17 | Discharge: 2024-08-17 | Disposition: A | Discharge status: Home / Self Care | Source: Ambulatory Visit | Attending: Radiation Oncology | Admitting: Radiation Oncology

## 2024-08-17 ENCOUNTER — Telehealth: Payer: Self-pay

## 2024-08-17 ENCOUNTER — Other Ambulatory Visit: Payer: Self-pay | Admitting: Radiation Oncology

## 2024-08-17 DIAGNOSIS — Z191 Hormone sensitive malignancy status: Secondary | ICD-10-CM | POA: Diagnosis not present

## 2024-08-17 DIAGNOSIS — C61 Malignant neoplasm of prostate: Secondary | ICD-10-CM | POA: Diagnosis not present

## 2024-08-17 DIAGNOSIS — Z510 Encounter for antineoplastic radiation therapy: Secondary | ICD-10-CM | POA: Diagnosis not present

## 2024-08-17 LAB — RAD ONC ARIA SESSION SUMMARY
Course Elapsed Days: 18
Plan Fractions Treated to Date: 13
Plan Prescribed Dose Per Fraction: 2.5 Gy
Plan Total Fractions Prescribed: 28
Plan Total Prescribed Dose: 70 Gy
Reference Point Dosage Given to Date: 32.5 Gy
Reference Point Session Dosage Given: 2.5 Gy
Session Number: 13

## 2024-08-17 MED ORDER — TAMSULOSIN HCL 0.4 MG PO CAPS
0.4000 mg | ORAL_CAPSULE | Freq: Every day | ORAL | 5 refills | Status: AC
  Filled 2024-08-17: qty 30, 30d supply, fill #0
  Filled 2024-09-12: qty 30, 30d supply, fill #1
  Filled 2024-10-12: qty 30, 30d supply, fill #2
  Filled 2024-11-06 – 2024-11-08 (×2): qty 30, 30d supply, fill #3

## 2024-08-17 NOTE — Telephone Encounter (Signed)
 RN called Juan Hanson to inform him that prescription was sent to his pharmacy, he could pick up today.   He was appreciative of the response and thanks staff.

## 2024-08-17 NOTE — Telephone Encounter (Signed)
 Mr. Byington came over to nursing after treatment wanted refill of his Tamsulosin .  Mr. Sobiech reports he received a 30-day supply when he had his markers placed and ran out on Friday 08/14/2024.  During undertreat visit on Friday he was seen by Dr. Dewey and mentioned he had run out.  Mr. Brumett reports doctor wanted to see how he does over the weekend without and to report back on Monday 08/17/2024.  Reports he has noticed a difference without the Tamsulosin  with increased urinary frequency, weak urine stream, and dribbling and reports would like to start back on his Tamsulosin .  Dr. Patrcia and Sabra Rusk, PA-C were notified of request.

## 2024-08-18 ENCOUNTER — Other Ambulatory Visit: Payer: Self-pay

## 2024-08-18 ENCOUNTER — Ambulatory Visit
Admission: RE | Admit: 2024-08-18 | Discharge: 2024-08-18 | Disposition: A | Discharge status: Home / Self Care | Source: Ambulatory Visit | Attending: Radiation Oncology | Admitting: Radiation Oncology

## 2024-08-18 DIAGNOSIS — Z510 Encounter for antineoplastic radiation therapy: Secondary | ICD-10-CM | POA: Diagnosis not present

## 2024-08-18 LAB — RAD ONC ARIA SESSION SUMMARY
Course Elapsed Days: 19
Plan Fractions Treated to Date: 14
Plan Prescribed Dose Per Fraction: 2.5 Gy
Plan Total Fractions Prescribed: 28
Plan Total Prescribed Dose: 70 Gy
Reference Point Dosage Given to Date: 35 Gy
Reference Point Session Dosage Given: 2.5 Gy
Session Number: 14

## 2024-08-19 ENCOUNTER — Ambulatory Visit
Admission: RE | Admit: 2024-08-19 | Discharge: 2024-08-19 | Disposition: A | Source: Ambulatory Visit | Attending: Radiation Oncology

## 2024-08-19 ENCOUNTER — Other Ambulatory Visit: Payer: Self-pay

## 2024-08-19 DIAGNOSIS — C61 Malignant neoplasm of prostate: Secondary | ICD-10-CM | POA: Diagnosis not present

## 2024-08-19 DIAGNOSIS — Z510 Encounter for antineoplastic radiation therapy: Secondary | ICD-10-CM | POA: Diagnosis not present

## 2024-08-19 DIAGNOSIS — Z191 Hormone sensitive malignancy status: Secondary | ICD-10-CM | POA: Diagnosis not present

## 2024-08-19 LAB — RAD ONC ARIA SESSION SUMMARY
Course Elapsed Days: 20
Plan Fractions Treated to Date: 15
Plan Prescribed Dose Per Fraction: 2.5 Gy
Plan Total Fractions Prescribed: 28
Plan Total Prescribed Dose: 70 Gy
Reference Point Dosage Given to Date: 37.5 Gy
Reference Point Session Dosage Given: 2.5 Gy
Session Number: 15

## 2024-08-20 ENCOUNTER — Ambulatory Visit
Admission: RE | Admit: 2024-08-20 | Discharge: 2024-08-20 | Disposition: A | Discharge status: Home / Self Care | Source: Ambulatory Visit | Attending: Radiation Oncology | Admitting: Radiation Oncology

## 2024-08-20 ENCOUNTER — Other Ambulatory Visit: Payer: Self-pay

## 2024-08-20 ENCOUNTER — Ambulatory Visit
Admission: RE | Admit: 2024-08-20 | Discharge: 2024-08-20 | Disposition: A | Source: Ambulatory Visit | Attending: Radiation Oncology

## 2024-08-20 DIAGNOSIS — Z510 Encounter for antineoplastic radiation therapy: Secondary | ICD-10-CM | POA: Diagnosis not present

## 2024-08-20 LAB — RAD ONC ARIA SESSION SUMMARY
Course Elapsed Days: 21
Plan Fractions Treated to Date: 16
Plan Prescribed Dose Per Fraction: 2.5 Gy
Plan Total Fractions Prescribed: 28
Plan Total Prescribed Dose: 70 Gy
Reference Point Dosage Given to Date: 40 Gy
Reference Point Session Dosage Given: 2.5 Gy
Session Number: 16

## 2024-08-20 NOTE — Progress Notes (Signed)
 Remote PPM Transmission

## 2024-08-21 ENCOUNTER — Ambulatory Visit
Admission: RE | Admit: 2024-08-21 | Discharge: 2024-08-21 | Disposition: A | Discharge status: Home / Self Care | Source: Ambulatory Visit | Attending: Radiation Oncology | Admitting: Radiation Oncology

## 2024-08-21 ENCOUNTER — Other Ambulatory Visit: Payer: Self-pay

## 2024-08-21 DIAGNOSIS — Z510 Encounter for antineoplastic radiation therapy: Secondary | ICD-10-CM | POA: Diagnosis not present

## 2024-08-21 LAB — RAD ONC ARIA SESSION SUMMARY
Course Elapsed Days: 22
Plan Fractions Treated to Date: 17
Plan Prescribed Dose Per Fraction: 2.5 Gy
Plan Total Fractions Prescribed: 28
Plan Total Prescribed Dose: 70 Gy
Reference Point Dosage Given to Date: 42.5 Gy
Reference Point Session Dosage Given: 2.5 Gy
Session Number: 17

## 2024-08-24 ENCOUNTER — Ambulatory Visit
Admission: RE | Admit: 2024-08-24 | Discharge: 2024-08-24 | Disposition: A | Source: Ambulatory Visit | Attending: Radiation Oncology

## 2024-08-24 ENCOUNTER — Other Ambulatory Visit: Payer: Self-pay

## 2024-08-24 DIAGNOSIS — Z510 Encounter for antineoplastic radiation therapy: Secondary | ICD-10-CM | POA: Diagnosis not present

## 2024-08-24 LAB — RAD ONC ARIA SESSION SUMMARY
Course Elapsed Days: 25
Plan Fractions Treated to Date: 18
Plan Prescribed Dose Per Fraction: 2.5 Gy
Plan Total Fractions Prescribed: 28
Plan Total Prescribed Dose: 70 Gy
Reference Point Dosage Given to Date: 45 Gy
Reference Point Session Dosage Given: 2.5 Gy
Session Number: 18

## 2024-08-25 ENCOUNTER — Ambulatory Visit
Admission: RE | Admit: 2024-08-25 | Discharge: 2024-08-25 | Disposition: A | Source: Ambulatory Visit | Attending: Radiation Oncology

## 2024-08-25 ENCOUNTER — Other Ambulatory Visit: Payer: Self-pay

## 2024-08-25 DIAGNOSIS — Z510 Encounter for antineoplastic radiation therapy: Secondary | ICD-10-CM | POA: Diagnosis not present

## 2024-08-25 LAB — RAD ONC ARIA SESSION SUMMARY
Course Elapsed Days: 26
Plan Fractions Treated to Date: 19
Plan Prescribed Dose Per Fraction: 2.5 Gy
Plan Total Fractions Prescribed: 28
Plan Total Prescribed Dose: 70 Gy
Reference Point Dosage Given to Date: 47.5 Gy
Reference Point Session Dosage Given: 2.5 Gy
Session Number: 19

## 2024-08-26 ENCOUNTER — Other Ambulatory Visit: Payer: Self-pay

## 2024-08-26 ENCOUNTER — Ambulatory Visit
Admission: RE | Admit: 2024-08-26 | Discharge: 2024-08-26 | Disposition: A | Source: Ambulatory Visit | Attending: Radiation Oncology

## 2024-08-26 DIAGNOSIS — Z510 Encounter for antineoplastic radiation therapy: Secondary | ICD-10-CM | POA: Diagnosis not present

## 2024-08-26 LAB — RAD ONC ARIA SESSION SUMMARY
Course Elapsed Days: 27
Plan Fractions Treated to Date: 20
Plan Prescribed Dose Per Fraction: 2.5 Gy
Plan Total Fractions Prescribed: 28
Plan Total Prescribed Dose: 70 Gy
Reference Point Dosage Given to Date: 50 Gy
Reference Point Session Dosage Given: 2.5 Gy
Session Number: 20

## 2024-08-27 ENCOUNTER — Other Ambulatory Visit: Payer: Self-pay

## 2024-08-27 ENCOUNTER — Ambulatory Visit
Admission: RE | Admit: 2024-08-27 | Discharge: 2024-08-27 | Disposition: A | Source: Ambulatory Visit | Attending: Radiation Oncology

## 2024-08-27 ENCOUNTER — Ambulatory Visit

## 2024-08-27 DIAGNOSIS — Z510 Encounter for antineoplastic radiation therapy: Secondary | ICD-10-CM | POA: Diagnosis not present

## 2024-08-27 LAB — RAD ONC ARIA SESSION SUMMARY
Course Elapsed Days: 28
Plan Fractions Treated to Date: 21
Plan Prescribed Dose Per Fraction: 2.5 Gy
Plan Total Fractions Prescribed: 28
Plan Total Prescribed Dose: 70 Gy
Reference Point Dosage Given to Date: 52.5 Gy
Reference Point Session Dosage Given: 2.5 Gy
Session Number: 21

## 2024-08-28 ENCOUNTER — Ambulatory Visit
Admission: RE | Admit: 2024-08-28 | Discharge: 2024-08-28 | Disposition: A | Discharge status: Home / Self Care | Source: Ambulatory Visit | Attending: Radiation Oncology | Admitting: Radiation Oncology

## 2024-08-28 ENCOUNTER — Other Ambulatory Visit: Payer: Self-pay

## 2024-08-28 DIAGNOSIS — Z510 Encounter for antineoplastic radiation therapy: Secondary | ICD-10-CM | POA: Diagnosis not present

## 2024-08-28 LAB — RAD ONC ARIA SESSION SUMMARY
Course Elapsed Days: 29
Plan Fractions Treated to Date: 22
Plan Prescribed Dose Per Fraction: 2.5 Gy
Plan Total Fractions Prescribed: 28
Plan Total Prescribed Dose: 70 Gy
Reference Point Dosage Given to Date: 55 Gy
Reference Point Session Dosage Given: 2.5 Gy
Session Number: 22

## 2024-08-31 ENCOUNTER — Other Ambulatory Visit: Payer: Self-pay

## 2024-08-31 ENCOUNTER — Ambulatory Visit
Admission: RE | Admit: 2024-08-31 | Discharge: 2024-08-31 | Disposition: A | Discharge status: Home / Self Care | Source: Ambulatory Visit | Attending: Radiation Oncology | Admitting: Radiation Oncology

## 2024-08-31 DIAGNOSIS — C61 Malignant neoplasm of prostate: Secondary | ICD-10-CM | POA: Insufficient documentation

## 2024-08-31 DIAGNOSIS — Z510 Encounter for antineoplastic radiation therapy: Secondary | ICD-10-CM | POA: Insufficient documentation

## 2024-08-31 DIAGNOSIS — Z191 Hormone sensitive malignancy status: Secondary | ICD-10-CM | POA: Diagnosis not present

## 2024-08-31 LAB — RAD ONC ARIA SESSION SUMMARY
Course Elapsed Days: 32
Plan Fractions Treated to Date: 23
Plan Prescribed Dose Per Fraction: 2.5 Gy
Plan Total Fractions Prescribed: 28
Plan Total Prescribed Dose: 70 Gy
Reference Point Dosage Given to Date: 57.5 Gy
Reference Point Session Dosage Given: 2.5 Gy
Session Number: 23

## 2024-09-01 ENCOUNTER — Other Ambulatory Visit: Payer: Self-pay

## 2024-09-01 ENCOUNTER — Ambulatory Visit
Admission: RE | Admit: 2024-09-01 | Discharge: 2024-09-01 | Disposition: A | Discharge status: Home / Self Care | Source: Ambulatory Visit | Attending: Radiation Oncology | Admitting: Radiation Oncology

## 2024-09-01 DIAGNOSIS — C61 Malignant neoplasm of prostate: Secondary | ICD-10-CM | POA: Diagnosis not present

## 2024-09-01 DIAGNOSIS — Z191 Hormone sensitive malignancy status: Secondary | ICD-10-CM | POA: Diagnosis not present

## 2024-09-01 DIAGNOSIS — Z510 Encounter for antineoplastic radiation therapy: Secondary | ICD-10-CM | POA: Diagnosis not present

## 2024-09-01 LAB — RAD ONC ARIA SESSION SUMMARY
Course Elapsed Days: 33
Plan Fractions Treated to Date: 24
Plan Prescribed Dose Per Fraction: 2.5 Gy
Plan Total Fractions Prescribed: 28
Plan Total Prescribed Dose: 70 Gy
Reference Point Dosage Given to Date: 60 Gy
Reference Point Session Dosage Given: 2.5 Gy
Session Number: 24

## 2024-09-02 ENCOUNTER — Other Ambulatory Visit: Payer: Self-pay

## 2024-09-02 ENCOUNTER — Ambulatory Visit
Admission: RE | Admit: 2024-09-02 | Discharge: 2024-09-02 | Disposition: A | Source: Ambulatory Visit | Attending: Radiation Oncology

## 2024-09-02 DIAGNOSIS — Z510 Encounter for antineoplastic radiation therapy: Secondary | ICD-10-CM | POA: Diagnosis not present

## 2024-09-02 DIAGNOSIS — Z191 Hormone sensitive malignancy status: Secondary | ICD-10-CM | POA: Diagnosis not present

## 2024-09-02 DIAGNOSIS — C61 Malignant neoplasm of prostate: Secondary | ICD-10-CM | POA: Diagnosis not present

## 2024-09-02 LAB — RAD ONC ARIA SESSION SUMMARY
Course Elapsed Days: 34
Plan Fractions Treated to Date: 25
Plan Prescribed Dose Per Fraction: 2.5 Gy
Plan Total Fractions Prescribed: 28
Plan Total Prescribed Dose: 70 Gy
Reference Point Dosage Given to Date: 62.5 Gy
Reference Point Session Dosage Given: 2.5 Gy
Session Number: 25

## 2024-09-03 ENCOUNTER — Other Ambulatory Visit: Payer: Self-pay

## 2024-09-03 ENCOUNTER — Ambulatory Visit
Admission: RE | Admit: 2024-09-03 | Discharge: 2024-09-03 | Disposition: A | Discharge status: Home / Self Care | Source: Ambulatory Visit | Attending: Radiation Oncology | Admitting: Radiation Oncology

## 2024-09-03 DIAGNOSIS — C61 Malignant neoplasm of prostate: Secondary | ICD-10-CM | POA: Diagnosis not present

## 2024-09-03 DIAGNOSIS — Z191 Hormone sensitive malignancy status: Secondary | ICD-10-CM | POA: Diagnosis not present

## 2024-09-03 DIAGNOSIS — Z510 Encounter for antineoplastic radiation therapy: Secondary | ICD-10-CM | POA: Diagnosis not present

## 2024-09-03 LAB — RAD ONC ARIA SESSION SUMMARY
Course Elapsed Days: 35
Plan Fractions Treated to Date: 26
Plan Prescribed Dose Per Fraction: 2.5 Gy
Plan Total Fractions Prescribed: 28
Plan Total Prescribed Dose: 70 Gy
Reference Point Dosage Given to Date: 65 Gy
Reference Point Session Dosage Given: 2.5 Gy
Session Number: 26

## 2024-09-04 ENCOUNTER — Ambulatory Visit
Admission: RE | Admit: 2024-09-04 | Discharge: 2024-09-04 | Disposition: A | Discharge status: Home / Self Care | Source: Ambulatory Visit | Attending: Radiation Oncology | Admitting: Radiation Oncology

## 2024-09-04 ENCOUNTER — Other Ambulatory Visit: Payer: Self-pay

## 2024-09-04 DIAGNOSIS — Z191 Hormone sensitive malignancy status: Secondary | ICD-10-CM | POA: Diagnosis not present

## 2024-09-04 DIAGNOSIS — Z510 Encounter for antineoplastic radiation therapy: Secondary | ICD-10-CM | POA: Diagnosis not present

## 2024-09-04 DIAGNOSIS — C61 Malignant neoplasm of prostate: Secondary | ICD-10-CM | POA: Diagnosis not present

## 2024-09-04 LAB — RAD ONC ARIA SESSION SUMMARY
Course Elapsed Days: 36
Plan Fractions Treated to Date: 27
Plan Prescribed Dose Per Fraction: 2.5 Gy
Plan Total Fractions Prescribed: 28
Plan Total Prescribed Dose: 70 Gy
Reference Point Dosage Given to Date: 67.5 Gy
Reference Point Session Dosage Given: 2.5 Gy
Session Number: 27

## 2024-09-07 ENCOUNTER — Ambulatory Visit

## 2024-09-07 ENCOUNTER — Ambulatory Visit
Admission: RE | Admit: 2024-09-07 | Discharge: 2024-09-07 | Disposition: A | Discharge status: Home / Self Care | Source: Ambulatory Visit | Attending: Radiation Oncology | Admitting: Radiation Oncology

## 2024-09-07 ENCOUNTER — Other Ambulatory Visit: Payer: Self-pay

## 2024-09-07 DIAGNOSIS — Z510 Encounter for antineoplastic radiation therapy: Secondary | ICD-10-CM | POA: Diagnosis not present

## 2024-09-07 DIAGNOSIS — C61 Malignant neoplasm of prostate: Secondary | ICD-10-CM | POA: Diagnosis not present

## 2024-09-07 DIAGNOSIS — Z191 Hormone sensitive malignancy status: Secondary | ICD-10-CM | POA: Diagnosis not present

## 2024-09-07 LAB — RAD ONC ARIA SESSION SUMMARY
Course Elapsed Days: 39
Plan Fractions Treated to Date: 28
Plan Prescribed Dose Per Fraction: 2.5 Gy
Plan Total Fractions Prescribed: 28
Plan Total Prescribed Dose: 70 Gy
Reference Point Dosage Given to Date: 70 Gy
Reference Point Session Dosage Given: 2.5 Gy
Session Number: 28

## 2024-09-08 ENCOUNTER — Ambulatory Visit

## 2024-09-08 NOTE — Radiation Completion Notes (Addendum)
" °  Radiation Oncology         [phone] ________________________________  Name: Juan Hanson MRN: [id number]  Date: 09/07/2024  DOB: 05-May-1956  Referring Physician: DONNICE BROOKS, M.D. Date of Service: 2024-09-08 Radiation Oncologist: Adina Barge, M.D. [facility] [place]     RADIATION ONCOLOGY END OF TREATMENT NOTE     Diagnosis: C61 Malignant neoplasm of prostate Staging on 2024-04-29: Malignant neoplasm of prostate (HCC) T=cT1c, N=cN0, M=cM0 Intent: Curative     ==========DELIVERED PLANS==========  First Treatment Date: 2024-07-30 Last Treatment Date: 2024-09-07   Plan Name: Prostate Site: Prostate Technique: IMRT Mode: Photon Dose Per Fraction: 2.5 Gy Prescribed Dose (Delivered / Prescribed): 70 Gy / 70 Gy Prescribed Fxs (Delivered / Prescribed): 28 / 28     ==========ON TREATMENT VISIT DATES========== 2024-07-31, 2024-08-06, 2024-08-14, 2024-08-20, 2024-08-28, 2024-09-03     See weekly On Treatment Notes in Epic for details in the Media tab (listed as Progress notes on the On Treatment Visit Dates listed above).  He tolerated the radiation treatments well with only mild increased LUTS and modest fatigue.  The patient will receive a call in about one month from the radiation oncology department. He will continue follow up with Dr. Brooks as well.  ------------------------------------------------   Donnice Barge, MD [facility]  Radiation Oncology Direct Dial: [phone]  Fax: [phone] [email or web address]  Skype  LinkedIn     "

## 2024-09-11 ENCOUNTER — Other Ambulatory Visit: Payer: Self-pay

## 2024-09-11 ENCOUNTER — Other Ambulatory Visit (HOSPITAL_COMMUNITY): Payer: Self-pay

## 2024-09-14 NOTE — Progress Notes (Signed)
 Patient was a RadOnc Consult on 06/02/24 for his stage T1c adenocarcinoma of the prostate with Gleason score of 4+3, and PSA of 7.09.  Patient proceed with treatment recommendations of 5.5 weeks of IMRT and had his final radiation treatment on 09/07/24.   Patient is scheduled for a post treatment nurse call on 10/06/24 and has his first post treatment PSA on 10/08/24 at [facility] followed by MD visit on 12/18.    RN spoke with patient and provided education on post treatment PSA monitoring.  All questions answered.  No additional needs at this time.

## 2024-09-16 DIAGNOSIS — H524 Presbyopia: Secondary | ICD-10-CM | POA: Diagnosis not present

## 2024-09-28 NOTE — Progress Notes (Signed)
  Radiation Oncology         (336) [phone] ________________________________  Name: Juan Hanson MRN: [id number]  Date of Service: 10/06/2024  DOB: 1956/03/25  Post Treatment Telephone Note  Diagnosis:   68 y.o. gentleman with Stage T1c adenocarcinoma of the prostate with Gleason score of 4+3, and PSA of 7.09.    First Treatment Date: 2024-07-30 Last Treatment Date: 2024-09-07   Plan Name: Prostate Site: Prostate Technique: IMRT Mode: Photon Dose Per Fraction: 2.5 Gy Prescribed Dose (Delivered / Prescribed): 70 Gy / 70 Gy Prescribed Fxs (Delivered / Prescribed): 28 / 28  Pre Treatment IPSS Score: 1 (as documented in the provider consult note)  The patient was available for call today.   Symptoms of fatigue have improved since completing therapy.  Symptoms of bladder changes have improved since completing therapy. Medications for bladder symptoms include Flomax .  Symptoms of bowel changes have improved since completing therapy.   Post Treatment IPSS Score: IPSS Questionnaire (AUA-7): Over the past month.   1)  How often have you had a sensation of not emptying your bladder completely after you finish urinating?  0 - Not at all  2)  How often have you had to urinate again less than two hours after you finished urinating? 3 - About half the time  3)  How often have you found you stopped and started again several times when you urinated?  0 - Not at all  4) How difficult have you found it to postpone urination?  0 - Not at all  5) How often have you had a weak urinary stream?  0 - Not at all  6) How often have you had to push or strain to begin urination?  0 - Not at all  7) How many times did you most typically get up to urinate from the time you went to bed until the time you got up in the morning?  0 - None  Total score:  3. Which indicates mild symptoms  0-7 mildly symptomatic   8-19 moderately symptomatic   20-35 severely symptomatic   Patient has a scheduled follow  up visit with his urologist, Dr. Nieves, on 10/16/24 and bloodwork scheduled on 10/09/24 for ongoing surveillance. He was counseled that PSA levels will be drawn in the urology office, and was reassured that additional time is expected to improve bowel and bladder symptoms. He was encouraged to call back with concerns or questions regarding radiation.

## 2024-10-01 ENCOUNTER — Encounter: Payer: Self-pay | Admitting: Internal Medicine

## 2024-10-01 ENCOUNTER — Ambulatory Visit: Attending: Internal Medicine | Admitting: Internal Medicine

## 2024-10-01 VITALS — BP 128/68 | HR 82 | Ht 76.0 in | Wt 361.0 lb

## 2024-10-01 DIAGNOSIS — I442 Atrioventricular block, complete: Secondary | ICD-10-CM | POA: Diagnosis not present

## 2024-10-01 NOTE — Progress Notes (Signed)
 HPI [name] returns today for followup. He is a pleasant 68 yo man with progressive heart block, now complete, and morbid obesity, s/p PPM insertion. He developed PM lead disfunction and exit block and underwent extraction and insertion of a new ventricular pacing lead. He denies chest pain. No dyspnea. He has mild peripheral edema but has been on norvasc . He admits to dietary indiscretion with sodium. His weight remains elevated. In the interim he notes that he  has been treated with XRT for prostate CA. He initially felt very weak but over the last couple of days has begun to feel better.  No Known Allergies   Current Outpatient Medications  Medication Sig Dispense Refill   amLODipine  (NORVASC ) 10 MG tablet Take 1 tablet (10 mg total) by mouth daily. 90 tablet 1   Continuous Glucose Sensor (FREESTYLE LIBRE 3 PLUS SENSOR) MISC Use as directed. Change every 15 days. 2 each 11   losartan  (COZAAR ) 100 MG tablet Take 1 tablet (100 mg total) by mouth daily. 90 tablet 1   metFORMIN  (GLUCOPHAGE ) 500 MG tablet Take 1 tablet (500 mg total) by mouth 2 (two) times daily. 180 tablet 1   oxyCODONE  (ROXICODONE ) 5 MG immediate release tablet Take 1 tablet (5 mg total) by mouth every 6 (six) hours as needed for up to 3 doses. 3 tablet 0   rosuvastatin  (CRESTOR ) 5 MG tablet Take 1 tablet (5 mg total) by mouth daily. 90 tablet 2   Semaglutide , 2 MG/DOSE, (OZEMPIC , 2 MG/DOSE,) 8 MG/3ML SOPN Inject 2 mg into the skin once a week. 3 mL 5   tamsulosin  (FLOMAX ) 0.4 MG CAPS capsule Take 1 capsule (0.4 mg total) by mouth daily after supper. 30 capsule 5   polyethylene glycol powder (MIRALAX ) 17 GM/SCOOP powder Take 17 g by mouth daily. Dissolve 1 capful (17g) in 4-8 ounces of liquid and take by mouth daily. 238 g 0   No current facility-administered medications for this visit.     Past Medical History:  Diagnosis Date   BPH (benign prostatic hyperplasia)    Chronic diastolic CHF (congestive heart  failure) (HCC)    followd by dr g. Daison Braxton;   previous symptomatic AV 2:1 block s/p PPM 02/ 2018;  progressive to CHB, asymptomatic;   echo 11-04-2023  ef 60-65%   Complete heart block [facility])    EP cardiology--- Dr KANDICE. Waddell;   Edema of both lower extremities    07-14-2024  ankles   HTN (hypertension)    Malignant neoplasm prostate [facility]) 04/2024   urologist--- dr nieves;  radiation oncologist--- dr patrcia;  dx 07/ 2025,  Gleason 4+3/  PSA 7.09/  vol 67cc   OSA on CPAP    followed by Dr CANDIE Acton [facility]);   per note in care everywhere,  study done 04-05-2012  , AHI 71.4, severe OSA   Presence of permanent cardiac pacemaker 12/18/2016   for asymptomactic SB/ AV second degree;   02-13-2022  lead dysfunction,  new DDD PM system & one new lead.  Medtronic   Right bundle branch block    /notes 12/18/2016   Type 2 diabetes mellitus with hyperglycemia (HCC)    followed by pcp;  pt has LIbre 3 Plus CGM  (07-14-2024  pt stated checks blood sugar multiple times ,  fasting average   90--110)    ROS:   All systems reviewed and negative except as noted in the HPI.   Past Surgical History:  Procedure Laterality  Date   COLONOSCOPY  2022   GOLD SEED IMPLANT N/A 07/16/2024   Procedure: INSERTION, GOLD SEEDS;  Surgeon: Shane Steffan BROCKS, MD;  Location: [facility] OR;  Service: Urology;  Laterality: N/A;   PACEMAKER IMPLANT N/A 12/18/2016   Procedure: Pacemaker Implant;  Surgeon: Danelle LELON Birmingham, MD;  Location: [facility] INVASIVE CV LAB;  Service: Cardiovascular;  Laterality: N/A;   PACEMAKER LEAD REMOVAL N/A 02/13/2022   Procedure: PACEMAKER LEAD REMOVAL, PACEMAKER LEAD INCERTION, PACEMAKER GENERATOR CHANGE;  Surgeon: Birmingham Danelle LELON, MD;  Location: [facility] OR;  Service: Cardiovascular;  Laterality: N/A;   SOFT TISSUE MASS EXCISION Left 03/03/2018   @[facility] by Dr CANDIE Blush;   of knee  (epidermal inclusion cyst)   SPACE OAR INSTILLATION N/A 07/16/2024   Procedure: INJECTION, HYDROGEL SPACER;  Surgeon:  Shane Steffan BROCKS, MD;  Location: [facility] OR;  Service: Urology;  Laterality: N/A;     Family History  Problem Relation Age of Onset   Lung cancer Mother    Arthritis Mother    Colon cancer Father    Lung cancer Father    Healthy Brother        x3   Healthy Sister      Social History   Socioeconomic History   Marital status: Divorced    Spouse name: Not on file   Number of children: 2   Years of education: Not on file   Highest education level: Not on file  Occupational History   Not on file  Tobacco Use   Smoking status: Never   Smokeless tobacco: Current    Types: Snuff   Tobacco comments:    07-14-2024  pt stated dip (pouches)  ,  started 2021  Vaping Use   Vaping status: Never Used  Substance and Sexual Activity   Alcohol use: Not Currently   Drug use: Never   Sexual activity: Not on file  Other Topics Concern   Not on file  Social History Narrative   Not on file   Social Drivers of Health   Financial Resource Strain: Not on file  Food Insecurity: No Food Insecurity (06/02/2024)   Hunger Vital Sign    Worried About Running Out of Food in the Last Year: Never true    Ran Out of Food in the Last Year: Never true  Transportation Needs: No Transportation Needs (06/02/2024)   PRAPARE - Administrator, Civil Service (Medical): No    Lack of Transportation (Non-Medical): No  Physical Activity: Not on file  Stress: Not on file  Social Connections: Unknown (11/03/2023)   Social Connection and Isolation Panel    Frequency of Communication with Friends and Family: More than three times a week    Frequency of Social Gatherings with Friends and Family: Once a week    Attends Religious Services: Patient declined    Database Administrator or Organizations: Yes    Attends Banker Meetings: 1 to 4 times per year    Marital Status: Patient unable to answer  Intimate Partner Violence: Not At Risk (06/02/2024)   Humiliation, Afraid, Rape, and Kick  questionnaire    Fear of Current or Ex-Partner: No    Emotionally Abused: No    Physically Abused: No    Sexually Abused: No     BP 128/68   Pulse 82   Ht 6' 4 (1.93 m)   Wt (!) 361 lb (163.7 kg)   SpO2 97%   BMI 43.94 kg/m   Physical  Exam:  Obese appearing NAD HEENT: Unremarkable Neck:  No JVD, no thyromegally Lymphatics:  No adenopathy Back:  No CVA tenderness Lungs:  Clear with no wheezes HEART:  Regular rate rhythm, no murmurs, no rubs, no clicks Abd:  soft, positive bowel sounds, no organomegally, no rebound, no guarding Ext:  2 plus pulses, no edema, no cyanosis, no clubbing Skin:  No rashes no nodules Neuro:  CN II through XII intact, motor grossly intact  DEVICE  Normal device function.  See PaceArt for details.   Assess/Plan: CHB - he is asymptomatic s/p DDD PM insertion. His thresholds are less than 1 in both atrium and ventricle. He is asymptomatic. No escape today. PPM - his medtronic DDD PM is working normally. We will recheck in several months. Obesity - he had lost weight but recently gained some back as he cannot exercise with his XRT. I encouraged him to work on this. He is back on a GLP-1.   Danelle Birmingham, MD

## 2024-10-01 NOTE — Patient Instructions (Signed)

## 2024-10-02 LAB — CUP PACEART INCLINIC DEVICE CHECK
Date Time Interrogation Session: 20251205172136
Implantable Lead Connection Status: 753985
Implantable Lead Connection Status: 753985
Implantable Lead Implant Date: 20180220
Implantable Lead Implant Date: 20230418
Implantable Lead Location: 753859
Implantable Lead Location: 753860
Implantable Lead Model: 5076
Implantable Lead Model: 5076
Implantable Pulse Generator Implant Date: 20230418

## 2024-10-06 ENCOUNTER — Ambulatory Visit
Admission: RE | Admit: 2024-10-06 | Discharge: 2024-10-06 | Disposition: A | Source: Ambulatory Visit | Attending: Radiation Oncology

## 2024-10-06 DIAGNOSIS — C61 Malignant neoplasm of prostate: Secondary | ICD-10-CM

## 2024-10-08 DIAGNOSIS — C61 Malignant neoplasm of prostate: Secondary | ICD-10-CM | POA: Diagnosis not present

## 2024-10-15 DIAGNOSIS — C61 Malignant neoplasm of prostate: Secondary | ICD-10-CM | POA: Diagnosis not present

## 2024-10-16 ENCOUNTER — Other Ambulatory Visit: Payer: Self-pay | Admitting: Urology

## 2024-10-16 DIAGNOSIS — C61 Malignant neoplasm of prostate: Secondary | ICD-10-CM

## 2024-10-22 ENCOUNTER — Emergency Department (HOSPITAL_BASED_OUTPATIENT_CLINIC_OR_DEPARTMENT_OTHER)
Admission: EM | Admit: 2024-10-22 | Discharge: 2024-10-22 | Disposition: A | Discharge status: Home / Self Care | Attending: Emergency Medicine | Admitting: Emergency Medicine

## 2024-10-22 ENCOUNTER — Encounter (HOSPITAL_BASED_OUTPATIENT_CLINIC_OR_DEPARTMENT_OTHER): Payer: Self-pay

## 2024-10-22 DIAGNOSIS — T23252A Burn of second degree of left palm, initial encounter: Secondary | ICD-10-CM | POA: Insufficient documentation

## 2024-10-22 DIAGNOSIS — X153XXA Contact with hot saucepan or skillet, initial encounter: Secondary | ICD-10-CM | POA: Diagnosis not present

## 2024-10-22 NOTE — ED Provider Notes (Signed)
 " [place] EMERGENCY DEPARTMENT AT [facility] Provider Note   CSN: [id number] Arrival date & time: 10/22/24  1944     Patient presents with: No chief complaint on file.   Juan Hanson is a 68 y.o. male.   Presents to the emergency department for evaluation of a burn of his left hand.  Patient reports he grabbed a hold of a hot pan and burned the hand around 7 PM.  Patient reports that initially there was severe pain across the entire palmar aspect of his hand and fingers.  At this point, however, patient reports minimal pain.       Prior to Admission medications  Medication Sig Start Date End Date Taking? Authorizing Provider  amLODipine  (NORVASC ) 10 MG tablet Take 1 tablet (10 mg total) by mouth daily. 08/13/24     Continuous Glucose Sensor (FREESTYLE LIBRE 3 PLUS SENSOR) MISC Use as directed. Change every 15 days. 03/16/24     losartan  (COZAAR ) 100 MG tablet Take 1 tablet (100 mg total) by mouth daily. 08/13/24     metFORMIN  (GLUCOPHAGE ) 500 MG tablet Take 1 tablet (500 mg total) by mouth 2 (two) times daily. 08/13/24     oxyCODONE  (ROXICODONE ) 5 MG immediate release tablet Take 1 tablet (5 mg total) by mouth every 6 (six) hours as needed for up to 3 doses. 07/16/24   Shane Steffan BROCKS, MD  rosuvastatin  (CRESTOR ) 5 MG tablet Take 1 tablet (5 mg total) by mouth daily. 08/14/24     Semaglutide , 2 MG/DOSE, (OZEMPIC , 2 MG/DOSE,) 8 MG/3ML SOPN Inject 2 mg into the skin once a week. 08/13/24     tamsulosin  (FLOMAX ) 0.4 MG CAPS capsule Take 1 capsule (0.4 mg total) by mouth daily after supper. 08/17/24   Patrcia Cough, MD    Allergies: Patient has no known allergies.    Review of Systems  Updated Vital Signs BP (!) 165/88 (BP Location: Right Wrist)   Pulse 99   Temp 98.2 F (36.8 C) (Oral)   Resp (!) 22   SpO2 92%   Physical Exam Vitals and nursing note reviewed.  Constitutional:      Appearance: Normal appearance.  Musculoskeletal:     Left hand: Tenderness  present. No deformity or lacerations. Normal range of motion. Normal strength. Normal sensation. There is no disruption of two-point discrimination. Normal capillary refill.  Skin:    Findings: Burn (Palmar aspect of left hand, 1 blister that is less than 1 cm in diameter mid palm) present.  Neurological:     Mental Status: He is alert.     (all labs ordered are listed, but only abnormal results are displayed) Labs Reviewed - No data to display  EKG: None  Radiology: No results found.   Procedures   Medications Ordered in the ED - No data to display                                  Medical Decision Making  Presents with burn to left hand.  Patient with a small blister on the mid palm area.  The remainder of the palm is slightly erythematous without any signs of blistering.  Normal sensation throughout.  Patient reports that his pain is significantly improved, no longer having any significant pain.  There is no circumferential burns or concern for long-term complications.  Will use bacitracin on any peeling blistering areas in the future, Motrin Tylenol  for pain.  Given return precautions.     Final diagnoses:  Partial thickness burn of palm of left hand, initial encounter    ED Discharge Orders     None          Anjoli Diemer, Lonni PARAS, MD 10/22/24 2317  "

## 2024-10-22 NOTE — ED Triage Notes (Signed)
 Pt c/o L hand pain, grabbed ahold of pan that had been in the oven. Pt w small blisters to palmar aspect of hand, redness to several fingertips.

## 2024-11-07 ENCOUNTER — Other Ambulatory Visit (HOSPITAL_COMMUNITY): Payer: Self-pay

## 2024-11-08 ENCOUNTER — Other Ambulatory Visit: Payer: Self-pay

## 2024-11-12 ENCOUNTER — Ambulatory Visit: Attending: Cardiology

## 2024-11-12 DIAGNOSIS — I442 Atrioventricular block, complete: Secondary | ICD-10-CM

## 2024-11-12 LAB — CUP PACEART REMOTE DEVICE CHECK
Battery Remaining Longevity: 119 mo
Battery Voltage: 3.02 V
Brady Statistic AP VP Percent: 2.26 %
Brady Statistic AP VS Percent: 0 %
Brady Statistic AS VP Percent: 97.68 %
Brady Statistic AS VS Percent: 0.07 %
Brady Statistic RA Percent Paced: 2.25 %
Brady Statistic RV Percent Paced: 99.93 %
Date Time Interrogation Session: 20260114202042
Implantable Lead Connection Status: 753985
Implantable Lead Connection Status: 753985
Implantable Lead Implant Date: 20180220
Implantable Lead Implant Date: 20230418
Implantable Lead Location: 753859
Implantable Lead Location: 753860
Implantable Lead Model: 5076
Implantable Lead Model: 5076
Implantable Pulse Generator Implant Date: 20230418
Lead Channel Impedance Value: 285 Ohm
Lead Channel Impedance Value: 323 Ohm
Lead Channel Impedance Value: 399 Ohm
Lead Channel Impedance Value: 475 Ohm
Lead Channel Pacing Threshold Amplitude: 0.625 V
Lead Channel Pacing Threshold Amplitude: 0.75 V
Lead Channel Pacing Threshold Pulse Width: 0.4 ms
Lead Channel Pacing Threshold Pulse Width: 0.4 ms
Lead Channel Sensing Intrinsic Amplitude: 2 mV
Lead Channel Sensing Intrinsic Amplitude: 2 mV
Lead Channel Sensing Intrinsic Amplitude: 8 mV
Lead Channel Sensing Intrinsic Amplitude: 8.125 mV
Lead Channel Setting Pacing Amplitude: 1.5 V
Lead Channel Setting Pacing Amplitude: 2 V
Lead Channel Setting Pacing Pulse Width: 0.4 ms
Lead Channel Setting Sensing Sensitivity: 4 mV
Zone Setting Status: 755011
Zone Setting Status: 755011

## 2024-11-13 ENCOUNTER — Ambulatory Visit: Payer: Self-pay | Admitting: Cardiology

## 2024-11-20 NOTE — Progress Notes (Signed)
 Remote PPM Transmission

## 2025-02-11 ENCOUNTER — Ambulatory Visit

## 2025-05-13 ENCOUNTER — Ambulatory Visit

## 2025-08-12 ENCOUNTER — Ambulatory Visit

## 2025-11-11 ENCOUNTER — Ambulatory Visit
